# Patient Record
Sex: Male | Born: 1940
Health system: Southern US, Community
[De-identification: ages and names within clinical notes are randomized; demographics above are authoritative.]

## PROBLEM LIST (undated history)

## (undated) DIAGNOSIS — Z86010 Personal history of colonic polyps: Secondary | ICD-10-CM

## (undated) DIAGNOSIS — R609 Edema, unspecified: Secondary | ICD-10-CM

## (undated) DIAGNOSIS — R221 Localized swelling, mass and lump, neck: Secondary | ICD-10-CM

## (undated) DIAGNOSIS — M549 Dorsalgia, unspecified: Secondary | ICD-10-CM

## (undated) DIAGNOSIS — I6529 Occlusion and stenosis of unspecified carotid artery: Secondary | ICD-10-CM

## (undated) DIAGNOSIS — R251 Tremor, unspecified: Secondary | ICD-10-CM

## (undated) DIAGNOSIS — D72829 Elevated white blood cell count, unspecified: Secondary | ICD-10-CM

## (undated) DIAGNOSIS — Z0001 Encounter for general adult medical examination with abnormal findings: Secondary | ICD-10-CM

## (undated) DIAGNOSIS — R05 Cough: Secondary | ICD-10-CM

## (undated) DIAGNOSIS — E785 Hyperlipidemia, unspecified: Secondary | ICD-10-CM

## (undated) DIAGNOSIS — I10 Essential (primary) hypertension: Secondary | ICD-10-CM

## (undated) DIAGNOSIS — E039 Hypothyroidism, unspecified: Secondary | ICD-10-CM

## (undated) DIAGNOSIS — R220 Localized swelling, mass and lump, head: Secondary | ICD-10-CM

## (undated) HISTORY — DX: Edema, unspecified: R60.9

## (undated) HISTORY — DX: Localized swelling, mass and lump, neck: R22.1

## (undated) HISTORY — DX: Dorsalgia, unspecified: M54.9

## (undated) HISTORY — DX: Cough: R05

## (undated) HISTORY — DX: Occlusion and stenosis of unspecified carotid artery: I65.29

## (undated) HISTORY — DX: Hyperlipidemia, unspecified: E78.5

## (undated) HISTORY — DX: Encounter for general adult medical examination with abnormal findings: Z00.01

## (undated) HISTORY — DX: Personal history of colonic polyps: Z86.010

## (undated) HISTORY — DX: Essential (primary) hypertension: I10

## (undated) HISTORY — DX: Localized swelling, mass and lump, head: R22.0

## (undated) HISTORY — DX: Hypothyroidism, unspecified: E03.9

## (undated) HISTORY — DX: Tremor, unspecified: R25.1

## (undated) HISTORY — PX: APPENDECTOMY: SHX54

## (undated) HISTORY — PX: CATARACT EXTRACTION: SUR2

## (undated) HISTORY — DX: Elevated white blood cell count, unspecified: D72.829

## (undated) HISTORY — PX: CHOLECYSTECTOMY: SHX55

---

## 2001-08-24 ENCOUNTER — Encounter: Payer: Self-pay | Admitting: Internal Medicine

## 2001-08-24 ENCOUNTER — Ambulatory Visit (HOSPITAL_COMMUNITY): Admission: RE | Admit: 2001-08-24 | Discharge: 2001-08-24 | Payer: Self-pay | Admitting: Internal Medicine

## 2003-08-14 ENCOUNTER — Ambulatory Visit (HOSPITAL_BASED_OUTPATIENT_CLINIC_OR_DEPARTMENT_OTHER): Admission: RE | Admit: 2003-08-14 | Discharge: 2003-08-14 | Payer: Self-pay | Admitting: General Surgery

## 2003-08-14 ENCOUNTER — Ambulatory Visit (HOSPITAL_COMMUNITY): Admission: RE | Admit: 2003-08-14 | Discharge: 2003-08-14 | Payer: Self-pay | Admitting: General Surgery

## 2003-08-14 ENCOUNTER — Encounter (INDEPENDENT_AMBULATORY_CARE_PROVIDER_SITE_OTHER): Payer: Self-pay | Admitting: Specialist

## 2006-05-16 ENCOUNTER — Emergency Department (HOSPITAL_COMMUNITY): Admission: EM | Admit: 2006-05-16 | Discharge: 2006-05-16 | Payer: Self-pay | Admitting: Emergency Medicine

## 2006-05-19 ENCOUNTER — Ambulatory Visit: Payer: Self-pay | Admitting: Internal Medicine

## 2006-05-29 ENCOUNTER — Ambulatory Visit: Payer: Self-pay | Admitting: Gastroenterology

## 2006-06-12 ENCOUNTER — Ambulatory Visit: Payer: Self-pay | Admitting: Gastroenterology

## 2006-06-12 ENCOUNTER — Encounter: Payer: Self-pay | Admitting: Gastroenterology

## 2006-06-17 LAB — HM COLONOSCOPY: HM Colonoscopy: ABNORMAL

## 2008-01-12 ENCOUNTER — Ambulatory Visit: Payer: Self-pay | Admitting: Internal Medicine

## 2008-01-12 DIAGNOSIS — I10 Essential (primary) hypertension: Secondary | ICD-10-CM

## 2008-01-12 DIAGNOSIS — Z860100 Personal history of colon polyps, unspecified: Secondary | ICD-10-CM | POA: Insufficient documentation

## 2008-01-12 DIAGNOSIS — E785 Hyperlipidemia, unspecified: Secondary | ICD-10-CM

## 2008-01-12 DIAGNOSIS — M549 Dorsalgia, unspecified: Secondary | ICD-10-CM | POA: Insufficient documentation

## 2008-01-12 HISTORY — DX: Essential (primary) hypertension: I10

## 2008-01-12 HISTORY — DX: Hyperlipidemia, unspecified: E78.5

## 2008-01-12 HISTORY — DX: Personal history of colonic polyps: Z86.010

## 2008-01-12 HISTORY — DX: Personal history of colon polyps, unspecified: Z86.0100

## 2008-01-12 HISTORY — DX: Dorsalgia, unspecified: M54.9

## 2008-02-22 ENCOUNTER — Ambulatory Visit: Payer: Self-pay | Admitting: Internal Medicine

## 2008-02-23 LAB — CONVERTED CEMR LAB
ALT: 22 units/L (ref 0–53)
AST: 18 units/L (ref 0–37)
Albumin: 4.2 g/dL (ref 3.5–5.2)
Alkaline Phosphatase: 59 units/L (ref 39–117)
BUN: 17 mg/dL (ref 6–23)
Basophils Relative: 0 % (ref 0.0–3.0)
Bilirubin Urine: NEGATIVE
Bilirubin, Direct: 0.1 mg/dL (ref 0.0–0.3)
CO2: 31 meq/L (ref 19–32)
Calcium: 9.3 mg/dL (ref 8.4–10.5)
Chloride: 100 meq/L (ref 96–112)
Cholesterol: 185 mg/dL (ref 0–200)
Creatinine, Ser: 1 mg/dL (ref 0.4–1.5)
Eosinophils Relative: 1.5 % (ref 0.0–5.0)
GFR calc Af Amer: 96 mL/min
GFR calc non Af Amer: 79 mL/min
Glucose, Bld: 102 mg/dL — ABNORMAL HIGH (ref 70–99)
HCT: 49.6 % (ref 39.0–52.0)
HDL: 43 mg/dL (ref >39.0)
Hemoglobin, Urine: NEGATIVE
Hemoglobin: 17 g/dL (ref 13.0–17.0)
LDL Cholesterol: 121 mg/dL — ABNORMAL HIGH (ref 0–99)
Leukocytes, UA: NEGATIVE
Lymphocytes Relative: 27.6 % (ref 12.0–46.0)
MCHC: 34.3 g/dL (ref 30.0–36.0)
MCV: 97.1 fL (ref 78.0–100.0)
Monocytes Relative: 8.1 % (ref 3.0–12.0)
Neutrophils Relative %: 62.8 % (ref 43.0–77.0)
Nitrite: NEGATIVE
PSA: 0.55 ng/mL (ref 0.10–4.00)
Platelets: 210 10*3/uL (ref 150–400)
Potassium: 4.7 meq/L (ref 3.5–5.1)
RBC: 5.11 M/uL (ref 4.22–5.81)
RDW: 13.5 % (ref 11.5–14.6)
Sodium: 139 meq/L (ref 135–145)
Specific Gravity, Urine: 1.01 (ref 1.000–1.03)
TSH: 2.91 microintl units/mL (ref 0.35–5.50)
Total Bilirubin: 1.1 mg/dL (ref 0.3–1.2)
Total CHOL/HDL Ratio: 4.3
Total Protein, Urine: NEGATIVE mg/dL
Total Protein: 7.2 g/dL (ref 6.0–8.3)
Triglycerides: 103 mg/dL (ref 0–149)
Urine Glucose: NEGATIVE mg/dL
Urobilinogen, UA: 1 (ref 0.0–1.0)
VLDL: 21 mg/dL (ref 0–40)
Vit D, 1,25-Dihydroxy: 42 (ref 30–89)
WBC: 17.1 10*3/uL — ABNORMAL HIGH (ref 4.5–10.5)
pH: 7.5 (ref 5.0–8.0)

## 2008-02-28 ENCOUNTER — Ambulatory Visit: Payer: Self-pay | Admitting: Internal Medicine

## 2008-02-28 LAB — CONVERTED CEMR LAB
Basophils Absolute: 0 10*3/uL (ref 0.0–0.1)
Basophils Relative: 0.4 % (ref 0.0–3.0)
Eosinophils Absolute: 0.2 10*3/uL (ref 0.0–0.7)
Eosinophils Relative: 2.1 % (ref 0.0–5.0)
HCT: 47.2 % (ref 39.0–52.0)
Hemoglobin: 16.5 g/dL (ref 13.0–17.0)
Lymphocytes Relative: 27.7 % (ref 12.0–46.0)
MCHC: 35 g/dL (ref 30.0–36.0)
MCV: 96.8 fL (ref 78.0–100.0)
Monocytes Absolute: 1.5 10*3/uL — ABNORMAL HIGH (ref 0.1–1.0)
Monocytes Relative: 13.3 % — ABNORMAL HIGH (ref 3.0–12.0)
Neutro Abs: 6.6 10*3/uL (ref 1.4–7.7)
Neutrophils Relative %: 56.5 % (ref 43.0–77.0)
Platelets: 263 10*3/uL (ref 150–400)
RBC: 4.87 M/uL (ref 4.22–5.81)
RDW: 14 % (ref 11.5–14.6)
WBC: 11.5 10*3/uL — ABNORMAL HIGH (ref 4.5–10.5)

## 2008-03-02 ENCOUNTER — Ambulatory Visit: Payer: Self-pay | Admitting: Internal Medicine

## 2008-03-02 DIAGNOSIS — D72829 Elevated white blood cell count, unspecified: Secondary | ICD-10-CM | POA: Insufficient documentation

## 2008-03-02 HISTORY — DX: Elevated white blood cell count, unspecified: D72.829

## 2010-05-28 ENCOUNTER — Ambulatory Visit: Payer: Self-pay | Admitting: Internal Medicine

## 2010-05-28 DIAGNOSIS — R220 Localized swelling, mass and lump, head: Secondary | ICD-10-CM | POA: Insufficient documentation

## 2010-05-28 DIAGNOSIS — R059 Cough, unspecified: Secondary | ICD-10-CM | POA: Insufficient documentation

## 2010-05-28 DIAGNOSIS — R221 Localized swelling, mass and lump, neck: Secondary | ICD-10-CM

## 2010-05-28 DIAGNOSIS — R05 Cough: Secondary | ICD-10-CM

## 2010-05-28 HISTORY — DX: Cough, unspecified: R05.9

## 2010-05-28 HISTORY — DX: Localized swelling, mass and lump, head: R22.0

## 2010-05-28 LAB — CONVERTED CEMR LAB
ALT: 19 units/L (ref 0–53)
AST: 19 units/L (ref 0–37)
Albumin: 4 g/dL (ref 3.5–5.2)
Alkaline Phosphatase: 61 units/L (ref 39–117)
BUN: 19 mg/dL (ref 6–23)
Basophils Absolute: 0.1 10*3/uL (ref 0.0–0.1)
Basophils Relative: 1.1 % (ref 0.0–3.0)
Bilirubin Urine: NEGATIVE
Bilirubin, Direct: 0.1 mg/dL (ref 0.0–0.3)
CO2: 32 meq/L (ref 19–32)
Calcium: 9.4 mg/dL (ref 8.4–10.5)
Chloride: 105 meq/L (ref 96–112)
Cholesterol: 196 mg/dL (ref 0–200)
Creatinine, Ser: 1.2 mg/dL (ref 0.4–1.5)
Direct LDL: 116.7 mg/dL
Eosinophils Absolute: 0.3 10*3/uL (ref 0.0–0.7)
Eosinophils Relative: 2.4 % (ref 0.0–5.0)
GFR calc non Af Amer: 66.3 mL/min (ref >60)
Glucose, Bld: 94 mg/dL (ref 70–99)
HCT: 47 % (ref 39.0–52.0)
HDL: 51 mg/dL (ref >39.00)
Hemoglobin: 16.3 g/dL (ref 13.0–17.0)
Ketones, ur: NEGATIVE mg/dL
Leukocytes, UA: NEGATIVE
Lymphocytes Relative: 23.7 % (ref 12.0–46.0)
Lymphs Abs: 3.1 10*3/uL (ref 0.7–4.0)
MCHC: 34.6 g/dL (ref 30.0–36.0)
MCV: 100.1 fL — ABNORMAL HIGH (ref 78.0–100.0)
Monocytes Absolute: 1.7 10*3/uL — ABNORMAL HIGH (ref 0.1–1.0)
Monocytes Relative: 13.1 % — ABNORMAL HIGH (ref 3.0–12.0)
Neutro Abs: 7.7 10*3/uL (ref 1.4–7.7)
Neutrophils Relative %: 59.7 % (ref 43.0–77.0)
Nitrite: NEGATIVE
PSA: 0.52 ng/mL (ref 0.10–4.00)
Platelets: 263 10*3/uL (ref 150.0–400.0)
Potassium: 4.2 meq/L (ref 3.5–5.1)
RBC: 4.7 M/uL (ref 4.22–5.81)
RDW: 14.4 % (ref 11.5–14.6)
Sodium: 141 meq/L (ref 135–145)
Specific Gravity, Urine: >=1.03 (ref 1.000–1.030)
TSH: 4.59 microintl units/mL (ref 0.35–5.50)
Total Bilirubin: 0.6 mg/dL (ref 0.3–1.2)
Total CHOL/HDL Ratio: 4
Total Protein, Urine: NEGATIVE mg/dL
Total Protein: 6.9 g/dL (ref 6.0–8.3)
Triglycerides: 230 mg/dL — ABNORMAL HIGH (ref 0.0–149.0)
Urine Glucose: NEGATIVE mg/dL
Urobilinogen, UA: 0.2 (ref 0.0–1.0)
VLDL: 46 mg/dL — ABNORMAL HIGH (ref 0.0–40.0)
WBC: 12.9 10*3/uL — ABNORMAL HIGH (ref 4.5–10.5)
pH: 5.5 (ref 5.0–8.0)

## 2010-06-06 ENCOUNTER — Encounter: Admission: RE | Admit: 2010-06-06 | Discharge: 2010-06-06 | Payer: Self-pay | Admitting: Otolaryngology

## 2010-06-25 ENCOUNTER — Encounter: Payer: Self-pay | Admitting: Internal Medicine

## 2010-06-28 ENCOUNTER — Ambulatory Visit: Payer: Self-pay | Admitting: Internal Medicine

## 2010-06-28 DIAGNOSIS — R609 Edema, unspecified: Secondary | ICD-10-CM | POA: Insufficient documentation

## 2010-06-28 HISTORY — DX: Edema, unspecified: R60.9

## 2010-08-29 NOTE — Assessment & Plan Note (Signed)
Summary: 1 MO ROV /NWS  #   Vital Signs:  Patient profile:   70 year old male Height:      68 inches Weight:      234 pounds BMI:     35.71 O2 Sat:      95 % on Room air Temp:     98 degrees F oral Pulse rate:   80 / minute BP sitting:   142 / 72  (left arm) Cuff size:   large  Vitals Entered By: Zella Ball Ewing CMA Duncan Dull) (June 28, 2010 2:14 PM)  O2 Flow:  Room air  Preventive Care Screening     declines flu shot/pneumovax  CC: 1 month ROV/RE   CC:  1 month ROV/RE.  History of Present Illness: here to f/u , overall doing ok,  some increased LE edema recent, stands quite a bit, gained wt, no longer walking as much a mile per day as wife has less time (wife is IT trainer still working);  Pt denies CP, worsening sob, doe, wheezing, orthopnea, pnd, palps, dizziness or syncope. Pt denies new neuro symptoms such as headache, facial or extremity weakness  Pt denies polydipsia, polyuria, or low sugar symptoms such as shakiness im.  Overall good compliance with meds, trying to follow low chol  diet, wt stable, little excercise however   Saw ENT with CT scan  nov 10 with prob benign parotid gland mass, but has f/u with ENT soon  Problems Prior to Update: 1)  Peripheral Edema  (ICD-782.3) 2)  Cough  (ICD-786.2) 3)  Swelling Mass or Lump in Head and Neck  (ICD-784.2) 4)  Leukocytosis  (ICD-288.60) 5)  Preventive Health Care  (ICD-V70.0) 6)  Colonic Polyps, Hx of  (ICD-V12.72) 7)  Hypertension  (ICD-401.9) 8)  Hyperlipidemia  (ICD-272.4) 9)  Back Pain  (ICD-724.5)  Medications Prior to Update: 1)  Adult Aspirin Ec Low Strength 81 Mg  Tbec (Aspirin) .Marland Kitchen.. 1 By Mouth Once Daily 2)  Glucosamine 500 Mg Caps (Glucosamine Sulfate) .Marland Kitchen.. 1 By Mouth Once Daily 3)  Multivitamins  Tabs (Multiple Vitamin) .Marland Kitchen.. 1 By Mouth Once Daily 4)  Losartan Potassium 100 Mg Tabs (Losartan Potassium) .Marland Kitchen.. 1po Once Daily  Current Medications (verified): 1)  Adult Aspirin Ec Low Strength 81 Mg  Tbec (Aspirin) .Marland Kitchen..  1 By Mouth Once Daily 2)  Glucosamine 500 Mg Caps (Glucosamine Sulfate) .Marland Kitchen.. 1 By Mouth Once Daily 3)  Multivitamins  Tabs (Multiple Vitamin) .Marland Kitchen.. 1 By Mouth Once Daily 4)  Losartan Potassium-Hctz 100-12.5 Mg Tabs (Losartan Potassium-Hctz) .Marland Kitchen.. 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   Head:  Normocephalic and atraumatic without obvious abnormalities. No apparent alopecia or balding. Eyes:  No corneal or conjunctival inflammation noted. EOMI. Perrla. Ears:  R ear normal and L ear normal.   Nose:  External nasal examination shows no deformity or inflammation. Nasal mucosa are pink and moist without lesions or exudates. Mouth:  Oral mucosa and  oropharynx without lesions or exudates.  Teeth in good repair s/p double root canals Neck:  No deformities, masses, or tenderness noted. except for approx 1 cm mass firm (not soft or hard) relatively fixed to site just post to angle left jaw Lungs:  Normal respiratory effort, chest expands symmetrically. Lungs are clear to auscultation, no crackles or wheezes. Heart:  Normal rate and regular rhythm. S1 and S2 normal without gallop, murmur, click, rub or other extra sounds. Extremities:  trace -1+ bilat LE edema, no ulcers    Impression & Recommendations:  Problem # 1:  PERIPHERAL EDEMA (ICD-782.3)  His updated medication  list for this problem includes:    Losartan Potassium-hctz 100-12.5 Mg Tabs (Losartan potassium-hctz) .Marland Kitchen... 1po once daily treat as above, f/u any worsening signs or symptoms - added the HCT to the losartan  Problem # 2:  HYPERTENSION (ICD-401.9)  His updated medication list for this problem includes:    Losartan Potassium-hctz 100-12.5 Mg Tabs (Losartan potassium-hctz) .Marland Kitchen... 1po once daily  BP today: 142/72 Prior BP: 150/88 (05/28/2010)  Labs Reviewed: K+: 4.2 (05/28/2010) Creat: : 1.2 (05/28/2010)   Chol: 196 (05/28/2010)   HDL: 51.00 (05/28/2010)   LDL: 121 (02/22/2008)   TG: 230.0 (05/28/2010) treat as above, f/u any worsening signs or symptoms , uncontrolled overall, f/u BP at home and next visit  Problem # 3:  HYPERLIPIDEMIA (ICD-272.4)  Labs Reviewed: SGOT: 19 (05/28/2010)   SGPT: 19 (05/28/2010)   HDL:51.00 (05/28/2010), 43.0 (02/22/2008)  LDL:121 (02/22/2008)  Chol:196 (05/28/2010), 185 (02/22/2008)  Trig:230.0 (05/28/2010), 103 (02/22/2008) d.w pt , declines further tx, Pt to continue diet efforts, to check labs - goal LDL less than 100  Complete Medication List: 1)  Adult Aspirin Ec Low Strength 81 Mg Tbec (Aspirin) .Marland Kitchen.. 1 by mouth once daily 2)  Glucosamine 500 Mg Caps (Glucosamine sulfate) .Marland Kitchen.. 1 by mouth once daily 3)  Multivitamins Tabs (Multiple vitamin) .Marland Kitchen.. 1 by mouth once daily 4)  Losartan Potassium-hctz 100-12.5 Mg Tabs (Losartan potassium-hctz) .Marland Kitchen.. 1po once daily  Patient Instructions: 1)  stop the losartan when you are done with the current bottle 2)  start the losartan-HCT - 1 per day after that 3)  Continue all previous medications as before this visit  4)  Please schedule a follow-up appointment in 6 months. Prescriptions: LOSARTAN POTASSIUM-HCTZ 100-12.5 MG TABS (LOSARTAN POTASSIUM-HCTZ) 1po once daily  #90 x 3   Entered and Authorized by:   Corwin Levins MD   Signed by:   Corwin Levins MD on 06/28/2010   Method used:   Print then Give to Patient    RxID:   [id number]    Orders Added: 1)  Est. Patient Level IV [69629]

## 2010-08-29 NOTE — Assessment & Plan Note (Signed)
Summary: 6 WK ROV /NWS $50   Vital Signs:  Patient Profile:   70 Years Old Male Weight:      216 pounds Temp:     98.1 degrees F oral Pulse rate:   65 / minute BP sitting:   158 / 91  (right arm) Cuff size:   regular  Vitals Entered By: Jerilynn Mages (February 22, 2008 10:12 AM)                  Chief Complaint:  6 week ROV.  History of Present Illness: BP at drug store usually in the mid 140's/85, lost 7 lbs intentionally; back pain resolved    Updated Prior Medication List: ADULT ASPIRIN EC LOW STRENGTH 81 MG  TBEC (ASPIRIN) 1 by mouth once daily  Current Allergies (reviewed today): No known allergies   Past Medical History:    Reviewed history from 01/12/2008 and no changes required:       Hyperlipidemia       Hypertension       Colonic polyps, hx of  Past Surgical History:    Reviewed history from 01/12/2008 and no changes required:       Appendectomy       Cholecystectomy       Cataract extraction   Family History:    Reviewed history from 01/12/2008 and no changes required:       heart disease       HTN       esophageal cancer       grandfather with TB  Social History:    Reviewed history from 01/12/2008 and no changes required:       work - Retail banker       former college professor - drama       former Nurse, 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 [employer]       Married       Current Smoker       Alcohol use-no       no children    Review of Systems  The patient denies anorexia, fever, weight loss, weight gain, vision loss, decreased hearing, hoarseness, chest pain, syncope, dyspnea on exertion, peripheral edema, prolonged cough, headaches, hemoptysis, abdominal pain, melena, hematochezia, severe indigestion/heartburn, hematuria, incontinence, genital sores, muscle weakness, suspicious skin lesions, transient blindness, difficulty walking, depression, unusual weight change, abnormal bleeding, enlarged lymph nodes, angioedema, breast masses, and testicular  masses.         has been depressed in the past due to poor business, gained wt but now trying to do beter; now trying to walk with wife daily;    Physical Exam  General:     alert and overweight-appearing.   Head:     Normocephalic and atraumatic without obvious abnormalities. No apparent alopecia or balding. Eyes:     No corneal or conjunctival inflammation noted. EOMI. Perrla. Ears:     External ear exam shows no significant lesions or deformities.  Otoscopic examination reveals clear canals, tympanic membranes are intact bilaterally without bulging, retraction, inflammation or discharge. Hearing is grossly normal bilaterally. Nose:     External nasal examination shows no deformity or inflammation. Nasal mucosa are pink and moist without lesions or exudates. Mouth:     Oral mucosa and oropharynx without lesions or exudates.  Teeth in good repair. Neck:     No deformities, masses, or tenderness noted. Lungs:     Normal respiratory effort, chest expands symmetrically. Lungs are clear to auscultation, no crackles or wheezes.  Heart:     Normal rate and regular rhythm. S1 and S2 normal without gallop, murmur, click, rub or other extra sounds. Abdomen:     Bowel sounds positive,abdomen soft and non-tender without masses, organomegaly or hernias noted. Msk:     No deformity or scoliosis noted of thoracic or lumbar spine.   Extremities:     No clubbing, cyanosis, edema, or deformity noted with normal full range of motion of all joints.   Neurologic:     alert & oriented X3, cranial nerves II-XII intact, and strength normal in all extremities.      Impression & Recommendations:  Problem # 1:  Preventive Health Care (ICD-V70.0) Overall doing well, up to date, counseled on routine health concerns for screening and prevention, immunizations up to date or declined, labs ordered   Orders: TLB-BMP (Basic Metabolic Panel-BMET) (80048-METABOL) TLB-CBC Platelet - w/Differential  (85025-CBCD) TLB-Hepatic/Liver Function Pnl (80076-HEPATIC) TLB-Lipid Panel (80061-LIPID) TLB-PSA (Prostate Specific Antigen) (84153-PSA) TLB-TSH (Thyroid Stimulating Hormone) (84443-TSH) TLB-Udip ONLY (81003-UDIP) T-Vitamin D (25-Hydroxy) (04540-98119)   Problem # 2:  HYPERTENSION (ICD-401.9) decines meds as this time, is trying to lose wt  Complete Medication List: 1)  Adult Aspirin Ec Low Strength 81 Mg Tbec (Aspirin) .Marland Kitchen.. 1 by mouth once daily   Patient Instructions: 1)  Please go to the Lab in the basement for your blood tests today 2)  Continue all medications that you may have been taking previously 3)  Please schedule a follow-up appointment in 1 year or sooner if needed 4)  Check your Blood Pressure regularly. If it is above 140/90: you should make an appointment sooner   ]

## 2010-08-29 NOTE — Assessment & Plan Note (Signed)
Summary: FU-R LEG PAIN-$50-STC   Vital Signs:  Patient Profile:   70 Years Old Male Weight:      223 pounds Temp:     97.2 degrees F oral Pulse rate:   64 / minute Pulse rhythm:   regular BP sitting:   160 / 90  (left arm) Cuff size:   large  Vitals Entered By: Rock Nephew CMA (January 12, 2008 2:19 PM)                 Preventive Care Screening  Colonoscopy:    Date:  06/17/2006    Next Due:  06/2011    Results:  abnormal   Last Tetanus Booster:    Date:  07/28/2001    Results:  given    History of Present Illness: here wth recurrent LBP ever 3 to 4 yrs since colletge days; usually last only 1 to 2 days; this time seemed to start the right Lower back to the right shin area Only with lying down at night; no problems with standing or walking; waklks many mornings for one mile for excercise and has kept on doing that; no weakness or falls - only when starts hurting at night and has to get up get the pain to improve  by standing; he hopes his BP today is due to stress and has not checked lately at home or o/w    Updated Prior Medication List: VICODIN 5-500 MG  TABS (HYDROCODONE-ACETAMINOPHEN) 1 by mouth at bedtime as needed pain PREDNISONE 20 MG  TABS (PREDNISONE) 1 by mouth once daily  Current Allergies (reviewed today): No known allergies   Past Medical History:    Reviewed history and no changes required:       Hyperlipidemia       Hypertension       Colonic polyps, hx of  Past Surgical History:    Reviewed history and no changes required:       Appendectomy       Cholecystectomy       Cataract extraction   Family History:    Reviewed history and no changes required:       heart disease       HTN       esophageal cancer       grandfather with TB  Social History:    Reviewed history and no changes required:       work - Retail banker       former college professor - drama       former Nurse, 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 No apparent alopecia or balding. Eyes:     No corneal or conjunctival inflammation noted. EOMI. Perrla.  Ears:     External ear exam shows no significant lesions or deformities.  Otoscopic examination reveals clear canals, tympanic membranes are intact bilaterally without bulging, retraction, inflammation or discharge. Hearing is grossly normal  bilaterally. Nose:     External nasal examination shows no deformity or inflammation. Nasal mucosa are pink and moist without lesions or exudates. Mouth:     Oral mucosa and oropharynx without lesions or exudates.  Teeth in good repair. Neck:     No deformities, masses, or tenderness noted. Lungs:     Normal respiratory effort, chest expands symmetrically. Lungs are clear to auscultation, no crackles or wheezes. Heart:     Normal rate and regular rhythm. S1 and S2 normal without gallop, murmur, click, rub or other extra sounds. Msk:     nontender lumbar paravetebral Extremities:     no edema, no ulcers  Neurologic:     alert & oriented X3, cranial nerves II-XII intact, and strength normal in all extremities.      Impression & Recommendations:  Problem # 1:  BACK PAIN (ICD-724.5) c/w right sciatica - tx with vicodin at bedtime as needed, check ls spine films, low dose prednisone His updated medication list for this problem includes:    Vicodin 5-500 Mg Tabs (Hydrocodone-acetaminophen) .Marland Kitchen... 1 by mouth at bedtime as needed pain  Orders: T-Lumbar Spine Complete, 5 Views  (71110TC)   Problem # 2:  ELEVATED BLOOD PRESSURE WITHOUT DIAGNOSIS OF HYPERTENSION (ICD-796.2) to check BP at home frequently  Problem # 3:  HYPERLIPIDEMIA (ICD-272.4) d/w pt most recent lipids, goal LDL less than 100  Complete Medication List: 1)  Vicodin 5-500 Mg Tabs (Hydrocodone-acetaminophen) .Marland Kitchen.. 1 by mouth at bedtime as needed pain 2)  Prednisone 20 Mg Tabs (Prednisone) .Marland Kitchen.. 1 by mouth once daily   Patient Instructions: 1)  take all new medications as prescribed 2)  continue all medications that you may have been taking previously 3)  you will have back xrays today 4)  Please schedule a follow-up appointment in 6 weeks with CPX labs 5)  Check your Blood Pressure regularly. If it is above: 140/90 you should make an appointment.   Prescriptions: PREDNISONE 20 MG  TABS (PREDNISONE) 1 by mouth once daily  #7 x 0   Entered and Authorized by:   Corwin Levins MD   Signed by:   Corwin Levins MD on 01/12/2008   Method used:   Print then Give to Patient   RxID:   [id number] VICODIN 5-500 MG  TABS (HYDROCODONE-ACETAMINOPHEN) 1 by mouth at bedtime as needed pain  #30 x 2   Entered and Authorized by:   Corwin Levins MD   Signed by:   Corwin Levins MD on 01/12/2008   Method used:   Print then Give to Patient   RxID:   [id number]  ]

## 2010-08-29 NOTE — Assessment & Plan Note (Signed)
Summary: LUMP BEHIND EAR/NWS   Vital Signs:  Patient profile:   70 year old male Height:      67.5 inches Weight:      232.13 pounds BMI:     35.95 O2 Sat:      94 % on Room air Temp:     98.9 degrees F oral Pulse rate:   71 / minute BP sitting:   150 / 88  (left arm) Cuff size:   large  Vitals Entered By: Zella Ball Ewing CMA Duncan Dull) (May 28, 2010 1:43 PM)  O2 Flow:  Room air  Preventive Care Screening     declines pneumovax  CC: Lump behind left ear/RE   CC:  Lump behind left ear/RE.  History of Present Illness: here for wellness and f/u after lost to f/u since aug 2009;  last summer had tongue callous removed  (no cancer), and 3 wks ago had double root canal doing better now;  smoking now down to half pack per day but not quite ready to quit; has hearing aids he doesnt wear so has approx moderate hearing loss bilat;   today has a knot he noticed 3 days ago to the left neck behind the angle of the jaw, non tender and no other pain;  No fever, wt loss, night sweats, loss of appetite or other constitutional symptoms Pt denies CP, worsening sob, doe, wheezing, orthopnea, pnd, worsening LE edema, palps, dizziness or syncope  Pt denies new neuro symptoms such as headache, facial or extremity weakness Pt states good ability with ADL's, low fall risk, home safety reviewed and adequate, no significant change in hearing or vision, trying to follow lower chol diet, and occasionally active only with regular excercise.   Preventive Screening-Counseling & Management      Drug Use:  no.    Problems Prior to Update: 1)  Cough  (ICD-786.2) 2)  Swelling Mass or Lump in Head and Neck  (ICD-784.2) 3)  Leukocytosis  (ICD-288.60) 4)  Preventive Health Care  (ICD-V70.0) 5)  Colonic Polyps, Hx of  (ICD-V12.72) 6)  Hypertension  (ICD-401.9) 7)  Hyperlipidemia  (ICD-272.4) 8)  Back Pain  (ICD-724.5)  Medications Prior to Update: 1)  Adult Aspirin Ec Low Strength 81 Mg  Tbec (Aspirin) .Marland Kitchen.. 1 By  Mouth Once Daily  Current Medications (verified): 1)  Adult Aspirin Ec Low Strength 81 Mg  Tbec (Aspirin) .Marland Kitchen.. 1 By Mouth Once Daily 2)  Glucosamine 500 Mg Caps (Glucosamine Sulfate) .Marland Kitchen.. 1 By Mouth Once Daily 3)  Multivitamins  Tabs (Multiple Vitamin) .Marland Kitchen.. 1 By Mouth Once Daily 4)  Losartan Potassium 100 Mg Tabs (Losartan Potassium) .Marland Kitchen.. 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 [employer] Married Current Smoker Alcohol use-no no children Drug use-no  Risk Factors: Smoking Status: current (01/12/2008)  Social History: 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         all otherwise negative per pt -  except for chronic ongoing occasional cough non prod, without overt reflux or allergic symptoms or wheezing  Physical Exam  General:  alert and overweight-appearing.   Head:  Normocephalic and atraumatic without obvious  abnormalities. No apparent alopecia or balding. Eyes:  No corneal or conjunctival inflammation noted. EOMI. Perrla. Ears:  R ear normal and L ear normal.   Nose:  External nasal examination shows no deformity or inflammation. Nasal mucosa are pink and moist without lesions or exudates. Mouth:  Oral mucosa and oropharynx without lesions or exudates.  Teeth in good repair s/p double root canals Neck:  No deformities, masses, or tenderness noted. except for approx 1 cm mass firm (not soft or hard) relatively fixed to site just post to angle left jaw Lungs:  Normal respiratory effort, chest expands symmetrically. Lungs are clear to auscultation, no crackles or wheezes. Heart:  Normal rate and regular rhythm. S1 and S2 normal without gallop, murmur, click, rub or other extra sounds. Abdomen:  Bowel sounds positive,abdomen soft and non-tender without masses, organomegaly or hernias noted. Msk:  No deformity or scoliosis noted of thoracic or lumbar spine.   Extremities:  no edema, no ulcers  Neurologic:  alert & oriented X3, cranial nerves II-XII intact, and strength normal in all extremities.   Skin:  no rashes.   Psych:  not depressed appearing.     Impression & Recommendations:  Problem # 1:  Preventive Health Care (ICD-V70.0) Overall doing well, age appropriate education and counseling updated, referral for preventive services and immunizations addressed, dietary counseling and smoking status adressed , most recent labs reviewed  I have personally reviewed and have noted 1.The patient's medical and social history 2.Their use of alcohol, tobacco or illicit drugs 3.Their current medications and supplements 4. Functional ability including ADL's, fall risk, home safety risk, hearing & visual impairment  5.Diet and physical activities 6.Evidence for depression or mood disorders The patients weight, height, BMI  have been recorded in the chart I have made referrals, counseling and provided  education to the patient based review of the above  Orders: TLB-BMP (Basic Metabolic Panel-BMET) (80048-METABOL) TLB-CBC Platelet - w/Differential (85025-CBCD) TLB-Hepatic/Liver Function Pnl (80076-HEPATIC) TLB-Lipid Panel (80061-LIPID) TLB-PSA (Prostate Specific Antigen) (84153-PSA) TLB-TSH (Thyroid Stimulating Hormone) (84443-TSH) TLB-Udip ONLY (81003-UDIP)  Problem # 2:  SWELLING MASS OR LUMP IN HEAD AND NECK (ICD-784.2)  single mass at and behind the left angle of jaw, no surrounding LA or other mass, oral exam ok;  did have recent oral procedure but lump nontender and firm, somewhat fixed , ? slightly > 1 cm -   somewhat suspicious for malignancy,  d/w pt - refer ENT, urged to quit smoking  Orders: ENT Referral (ENT)  Problem # 3:  COUGH (ICD-786.2)  ? smokers cough vs other - for cxr  Orders: T-2 View CXR, Same Day (71020.5TC)  Problem # 4:  HYPERTENSION (ICD-401.9)  BP today: 150/88 Prior BP: 150/84 (03/02/2008)  Labs Reviewed: K+: 4.7 (02/22/2008) Creat: : 1.0 (02/22/2008)   Chol: 185 (02/22/2008)   HDL: 43.0 (02/22/2008)   LDL: 121 (02/22/2008)   TG: 103 (02/22/2008) start losartan 100 mg per day  His updated medication list for this problem includes:    Losartan Potassium 100 Mg Tabs (Losartan potassium) .Marland KitchenMarland KitchenMarland KitchenMarland Kitchen  1po once daily  Complete Medication List: 1)  Adult Aspirin Ec Low Strength 81 Mg Tbec (Aspirin) .Marland Kitchen.. 1 by mouth once daily 2)  Glucosamine 500 Mg Caps (Glucosamine sulfate) .Marland Kitchen.. 1 by mouth once daily 3)  Multivitamins Tabs (Multiple vitamin) .Marland Kitchen.. 1 by mouth once daily 4)  Losartan Potassium 100 Mg Tabs (Losartan potassium) .Marland Kitchen.. 1po once daily  Patient Instructions: 1)  Please go to the Lab in the basement for your blood and/or urine tests today 2)  Please go to Radiology in the basement level for your X-Ray today  3)  Please call the number on the [facility] Card for results of your testing 4)  You will be contacted about the referral(s) to: ENT 5)  Please  take all new medications as prescribed  - the losartan 100 mg per blood pressure (one per day, no side effects) 6)  Continue all previous medications as before this visit 7)  Please schedule a follow-up appointment in 1 month. Prescriptions: LOSARTAN POTASSIUM 100 MG TABS (LOSARTAN POTASSIUM) 1po once daily  #90 x 3   Entered and Authorized by:   Corwin Levins MD   Signed by:   Corwin Levins MD on 05/28/2010   Method used:   Print then Give to Patient   RxID:   [id number]    Orders Added: 1)  ENT Referral [ENT] 2)  T-2 View CXR, Same Day [71020.5TC] 3)  TLB-BMP (Basic Metabolic Panel-BMET) [80048-METABOL] 4)  TLB-CBC Platelet - w/Differential [85025-CBCD] 5)  TLB-Hepatic/Liver Function Pnl [80076-HEPATIC] 6)  TLB-Lipid Panel [80061-LIPID] 7)  TLB-PSA (Prostate Specific Antigen) [84153-PSA] 8)  TLB-TSH (Thyroid Stimulating Hormone) [84443-TSH] 9)  TLB-Udip ONLY [81003-UDIP] 10)  Est. Patient 65& > [84696]

## 2010-08-29 NOTE — Consult Note (Signed)
Summary: Telephone Conversation / Narda Bonds MD  Telephone Conversation / Narda Bonds MD   Imported By: Lennie Odor 07/02/2010 14:53:48  _____________________________________________________________________  External Attachment:    Type:   Image     Comment:   External Document

## 2010-08-29 NOTE — Assessment & Plan Note (Signed)
Summary: FU Vincent Campbell $50   Vital Signs:  Patient Profile:   70 Years Old Male Weight:      216 pounds Temp:     98.2 degrees F oral Pulse rate:   66 / minute BP sitting:   150 / 84  (right arm) Cuff size:   large  Vitals Entered By: Jerilynn Mages (March 02, 2008 8:18 AM)                 Chief Complaint:  follow-up.  History of Present Illness: overall  doing well, complaint with meds, no fever or other s/s infection    Updated Prior Medication List: ADULT ASPIRIN EC LOW STRENGTH 81 MG  TBEC (ASPIRIN) 1 by mouth once daily  Current Allergies (reviewed today): No known allergies   Past Medical History:    Reviewed history from 01/12/2008 and no changes required:       Hyperlipidemia       Hypertension       Colonic polyps, hx of  Past Surgical History:    Reviewed history from 01/12/2008 and no changes required:       Appendectomy       Cholecystectomy       Cataract extraction   Social History:    Reviewed history from 01/12/2008 and no changes required:       work - Retail banker       former college professor - drama       former Nurse, 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   Head:     Normocephalic and atraumatic without obvious abnormalities. No apparent alopecia or balding. Eyes:     No corneal or conjunctival inflammation noted. EOMI. Perrla. Ears:     External ear exam shows no significant lesions or deformities.  Otoscopic examination reveals clear canals, tympanic membranes are intact bilaterally without bulging, retraction, inflammation or discharge. Hearing is grossly normal bilaterally. Nose:     External nasal examination shows no deformity or inflammation. Nasal mucosa are pink and moist without lesions or exudates. Mouth:     Oral mucosa and  oropharynx without lesions or exudates.  Teeth in good repair. Neck:     No deformities, masses, or tenderness noted. Lungs:     Normal respiratory effort, chest expands symmetrically. Lungs are clear to auscultation, no crackles or wheezes. Heart:     Normal rate and regular rhythm. S1 and S2 normal without gallop, murmur, click, rub or other extra sounds. Extremities:     no edema, no ulcers     Impression & Recommendations:  Problem # 1:  LEUKOCYTOSIS (ICD-288.60) improved on f/u - ? lab error to start - exam benign, ok to follow for now, to consdier repeat cbc next visit  Problem # 2:  HYPERTENSION (ICD-401.9)  BP today: 150/84 Prior BP: 158/91 (02/22/2008)  Labs Reviewed: Creat: 1.0 (02/22/2008) Chol: 185 (02/22/2008)   HDL: 43.0 (02/22/2008)   LDL: 121 (02/22/2008)   TG: 103 (02/22/2008) d/w pt - has marked denial of condition - refused meds today  Problem # 3:  HYPERLIPIDEMIA (ICD-272.4) d/w pt - marked denial of problem - declines statin  Complete Medication List: 1)  Adult Aspirin Ec Low Strength 81 Mg Tbec (Aspirin) .Marland Kitchen.. 1 by mouth once daily   Patient Instructions: 1)  Continue all medications that you may have been taking previously 2)  Please schedule a follow-up appointment in 6 months with prior: 3)  Lipid Panel prior to visit, ICD-9: 272.0 4)  CBC; 288.60 5)  Check your Blood Pressure regularly.Your goal is to be less than 140/90   ]

## 2010-12-13 NOTE — Op Note (Signed)
NAME:  Vincent Campbell, Vincent Campbell NO.:  [id number]   MEDICAL RECORD NO.:  [id number]                   PATIENT TYPE:  AMB   LOCATION:  [facility]                                  FACILITY:  [facility]   PHYSICIAN:  Jimmye Norman III, M.D.               DATE OF BIRTH:  27-Jan-1941   DATE OF PROCEDURE:  08/14/2003  DATE OF DISCHARGE:                                 OPERATIVE REPORT   PREOPERATIVE DIAGNOSES:  Sebaceous cyst, healed, of the mid back.   POSTOPERATIVE DIAGNOSES:  Sebaceous cyst, healed, of the mid back.   OPERATION PERFORMED:  Excision of sebaceous cyst.   SURGEON:  Marta Lamas. Lindie Spruce, M.D.   ASSISTANT:  None.   ANESTHESIA:  General endotracheal.   ESTIMATED BLOOD LOSS:  Less than 20 mL.   COMPLICATIONS:  None.   CONDITION:  Stable.   INDICATIONS FOR PROCEDURE:  The patient is a 70 year old who has had the  second of two sebaceous cysts, one previously removed, this recently healed  from an infection, who now comes in for excision.   DESCRIPTION OF PROCEDURE:  The patient was taken to the operating room and  placed on the table in the supine position.  After an adequate general  anesthetic was administered, the patient was flipped into the prone position  with his midback exposed and he was prepped and draped in the usual sterile  manner.   A #15 blade was used to make an oval incision around the midback lesion  measuring approximately 3.5 to 4 cm in total length.  WE excised it in order  to include the punctum of the sebaceous cyst which is in the upper portion  of the area of the previously scarred area.  We excised it down to the deep  subcutaneous tissue just above the fascia, obtained hemostasis with  electrocautery, then closed it using interrupted 3-0 nylon sutures.  No deep  sutures were placed.  We irrigated with saline prior to closure and also  used 8 mL of 0.25% Marcaine with epinephrine at the site.  All sponge,  needle and instrument counts  were correct.                                               Kathrin Ruddy, M.D.    JW/MEDQ  D:  08/14/2003  T:  08/14/2003  Job:  [id number]

## 2010-12-29 ENCOUNTER — Encounter: Payer: Self-pay | Admitting: Internal Medicine

## 2010-12-29 DIAGNOSIS — Z0001 Encounter for general adult medical examination with abnormal findings: Secondary | ICD-10-CM

## 2010-12-29 HISTORY — DX: Encounter for general adult medical examination with abnormal findings: Z00.01

## 2011-01-01 ENCOUNTER — Ambulatory Visit (INDEPENDENT_AMBULATORY_CARE_PROVIDER_SITE_OTHER): Payer: 59 | Admitting: Internal Medicine

## 2011-01-01 ENCOUNTER — Other Ambulatory Visit (INDEPENDENT_AMBULATORY_CARE_PROVIDER_SITE_OTHER): Payer: 59

## 2011-01-01 ENCOUNTER — Encounter: Payer: Self-pay | Admitting: Internal Medicine

## 2011-01-01 VITALS — BP 118/70 | HR 76 | Temp 98.4°F | Ht 67.0 in | Wt 232.2 lb

## 2011-01-01 DIAGNOSIS — Z0000 Encounter for general adult medical examination without abnormal findings: Secondary | ICD-10-CM

## 2011-01-01 LAB — LIPID PANEL
Cholesterol: 200 mg/dL (ref 0–200)
HDL: 49.6 mg/dL (ref >39.00)
LDL Cholesterol: 129 mg/dL — ABNORMAL HIGH (ref 0–99)
Total CHOL/HDL Ratio: 4
Triglycerides: 109 mg/dL (ref 0.0–149.0)
VLDL: 21.8 mg/dL (ref 0.0–40.0)

## 2011-01-01 LAB — CBC WITH DIFFERENTIAL/PLATELET
Basophils Absolute: 0.1 10*3/uL (ref 0.0–0.1)
Basophils Relative: 0.6 % (ref 0.0–3.0)
Eosinophils Absolute: 0.2 10*3/uL (ref 0.0–0.7)
Eosinophils Relative: 1.9 % (ref 0.0–5.0)
HCT: 48.2 % (ref 39.0–52.0)
Hemoglobin: 16.5 g/dL (ref 13.0–17.0)
Lymphocytes Relative: 23.1 % (ref 12.0–46.0)
Lymphs Abs: 2.7 10*3/uL (ref 0.7–4.0)
MCHC: 34.3 g/dL (ref 30.0–36.0)
MCV: 100.7 fl — ABNORMAL HIGH (ref 78.0–100.0)
Monocytes Absolute: 1.6 10*3/uL — ABNORMAL HIGH (ref 0.1–1.0)
Monocytes Relative: 13.4 % — ABNORMAL HIGH (ref 3.0–12.0)
Neutro Abs: 7.2 10*3/uL (ref 1.4–7.7)
Neutrophils Relative %: 61 % (ref 43.0–77.0)
Platelets: 252 10*3/uL (ref 150.0–400.0)
RBC: 4.78 Mil/uL (ref 4.22–5.81)
RDW: 14.8 % — ABNORMAL HIGH (ref 11.5–14.6)
WBC: 11.8 10*3/uL — ABNORMAL HIGH (ref 4.5–10.5)

## 2011-01-01 LAB — TSH: TSH: 3.01 u[IU]/mL (ref 0.35–5.50)

## 2011-01-01 LAB — URINALYSIS, ROUTINE W REFLEX MICROSCOPIC
Bilirubin Urine: NEGATIVE
Ketones, ur: NEGATIVE
Leukocytes, UA: NEGATIVE
Nitrite: NEGATIVE
Specific Gravity, Urine: 1.015 (ref 1.000–1.030)
Total Protein, Urine: NEGATIVE
Urine Glucose: NEGATIVE
Urobilinogen, UA: 0.2 (ref 0.0–1.0)
pH: 5.5 (ref 5.0–8.0)

## 2011-01-01 LAB — HEPATIC FUNCTION PANEL
ALT: 22 U/L (ref 0–53)
AST: 17 U/L (ref 0–37)
Albumin: 4.1 g/dL (ref 3.5–5.2)
Alkaline Phosphatase: 59 U/L (ref 39–117)
Bilirubin, Direct: 0.2 mg/dL (ref 0.0–0.3)
Total Bilirubin: 0.7 mg/dL (ref 0.3–1.2)
Total Protein: 7.2 g/dL (ref 6.0–8.3)

## 2011-01-01 LAB — BASIC METABOLIC PANEL
BUN: 18 mg/dL (ref 6–23)
CO2: 32 mEq/L (ref 19–32)
Calcium: 9.2 mg/dL (ref 8.4–10.5)
Chloride: 97 mEq/L (ref 96–112)
Creatinine, Ser: 1.1 mg/dL (ref 0.4–1.5)
GFR: 70.36 mL/min (ref >60.00)
Glucose, Bld: 118 mg/dL — ABNORMAL HIGH (ref 70–99)
Potassium: 4.4 mEq/L (ref 3.5–5.1)
Sodium: 137 mEq/L (ref 135–145)

## 2011-01-01 LAB — PSA: PSA: 0.58 ng/mL (ref 0.10–4.00)

## 2011-01-01 NOTE — Patient Instructions (Signed)
Continue all other medications as before Please go to LAB in the Basement for the blood and/or urine tests to be done today Please call the phone number [phone] (the [employer] System) for results of testing in 2-3 days;  When calling, simply dial the number, and when prompted enter the MRN number above (the Medical Record Number) and the # key, then the message should start. Please return in 1 year for your yearly visit, or sooner if needed, with Lab testing done 3-5 days before  

## 2011-01-01 NOTE — Progress Notes (Signed)
Subjective:    Patient ID: LEV CERVONE, male    DOB: 09-Jan-1941, 70 y.o.   MRN: [id number]  HPI  Here for wellness and f/u;  Overall doing ok;  Pt denies CP, worsening SOB, DOE, wheezing, orthopnea, PND, worsening LE edema, palpitations, dizziness or syncope.  Pt denies neurological change such as new Headache, facial or extremity weakness.  Pt denies polydipsia, polyuria, or low sugar symptoms. Pt states overall good compliance with treatment and medications, good tolerability, and trying to follow lower cholesterol diet.  Pt denies worsening depressive symptoms, suicidal ideation or panic. No fever, wt loss, night sweats, loss of appetite, or other constitutional symptoms.  Pt states good ability with ADL's, low fall risk, home safety reviewed and adequate, no significant changes in hearing or vision, and occasionally active with exercise.  Does have several wks ongoing nasal allergy symptoms with clear congestion, itch and sneeze, without fever, pain, ST, cough or wheezing.  Does have sense of ongoing fatigue, but denies signficant hypersomnolence.  Overall edema improved from last visit, still trace. Past Medical History  Diagnosis Date  . BACK PAIN 01/12/2008  . COLONIC POLYPS, HX OF 01/12/2008  . Cough 05/28/2010  . HYPERLIPIDEMIA 01/12/2008  . HYPERTENSION 01/12/2008  . LEUKOCYTOSIS 03/02/2008  . PERIPHERAL EDEMA 06/28/2010  . SWELLING MASS OR LUMP IN HEAD AND NECK 05/28/2010   Past Surgical History  Procedure Date  . Appendectomy   . Cholecystectomy   . Cataract extraction     reports that he has been smoking.  He does not have any smokeless tobacco history on file. He reports that he does not drink alcohol or use illicit drugs. family history includes Cancer in his other; Heart disease in his other; and Hypertension in his other. No Known Allergies Current Outpatient Prescriptions on File Prior to Visit  Medication Sig Dispense Refill  . aspirin 81 MG tablet Take 81 mg by mouth daily.         . Glucosamine 500 MG CAPS Take by mouth daily.        Marland Kitchen losartan-hydrochlorothiazide (HYZAAR) 100-12.5 MG per tablet Take 1 tablet by mouth daily.        . Multiple Vitamin (MULTIVITAMIN) capsule Take 1 capsule by mouth daily.         Review of Systems Review of Systems  Constitutional: Negative for diaphoresis, activity change, appetite change and unexpected weight change.  HENT: Negative for hearing loss, ear pain, facial swelling, mouth sores and neck stiffness.   Eyes: Negative for pain, redness and visual disturbance.  Respiratory: Negative for shortness of breath and wheezing.   Cardiovascular: Negative for chest pain and palpitations.  Gastrointestinal: Negative for diarrhea, blood in stool, abdominal distention and rectal pain.  Genitourinary: Negative for hematuria, flank pain and decreased urine volume.  Musculoskeletal: Negative for myalgias and joint swelling.  Skin: Negative for color change and wound.  Neurological: Negative for syncope and numbness.  Hematological: Negative for adenopathy.  Psychiatric/Behavioral: Negative for hallucinations, self-injury, decreased concentration and agitation.      Objective:   Physical Exam BP 118/70  Pulse 76  Temp(Src) 98.4 F (36.9 C) (Oral)  Ht 5\' 7"  (1.702 m)  Wt 232 lb 4 oz (105.348 kg)  BMI 36.38 kg/m2  SpO2 93% Physical Exam  VS noted Constitutional: Pt is oriented to person, place, and time. Appears well-developed and well-nourished.  HENT:  Head: Normocephalic and atraumatic.  Right Ear: External ear normal.  Left Ear: External ear normal.  Nose: Nose  normal.  Mouth/Throat: Oropharynx is clear and moist.  Eyes: Conjunctivae and EOM are normal. Pupils are equal, round, and reactive to light.  Neck: Normal range of motion. Neck supple. No JVD present. No tracheal deviation present.  Cardiovascular: Normal rate, regular rhythm, normal heart sounds and intact distal pulses.   Pulmonary/Chest: Effort normal and  breath sounds normal.  Abdominal: Soft. Bowel sounds are normal. There is no tenderness.  Musculoskeletal: Normal range of motion. Exhibits trace edema bilat LE's.  Lymphadenopathy:  Has no cervical adenopathy.  Neurological: Pt is alert and oriented to person, place, and time. Pt has normal reflexes. No cranial nerve deficit.  Skin: Skin is warm and dry. No rash noted.  Psychiatric:  Has  normal mood and affect. Behavior is normal.         Assessment & Plan:

## 2011-01-01 NOTE — Assessment & Plan Note (Signed)

## 2011-01-03 ENCOUNTER — Encounter: Payer: Self-pay | Admitting: Internal Medicine

## 2011-01-03 DIAGNOSIS — E119 Type 2 diabetes mellitus without complications: Secondary | ICD-10-CM | POA: Insufficient documentation

## 2011-01-03 LAB — HEMOGLOBIN A1C: Hgb A1c MFr Bld: 7.4 % — ABNORMAL HIGH (ref 4.6–6.5)

## 2011-01-05 ENCOUNTER — Other Ambulatory Visit: Payer: Self-pay | Admitting: Internal Medicine

## 2011-01-05 ENCOUNTER — Encounter: Payer: Self-pay | Admitting: Internal Medicine

## 2011-01-05 MED ORDER — ATORVASTATIN CALCIUM 10 MG PO TABS: 10.0000 mg | ORAL_TABLET | Freq: Every day | ORAL | Status: DC

## 2011-01-05 MED ORDER — METFORMIN HCL 500 MG PO TABS: 500.0000 mg | ORAL_TABLET | Freq: Every day | ORAL | Status: DC

## 2011-01-28 ENCOUNTER — Other Ambulatory Visit (INDEPENDENT_AMBULATORY_CARE_PROVIDER_SITE_OTHER): Payer: 59

## 2011-01-28 DIAGNOSIS — Z0000 Encounter for general adult medical examination without abnormal findings: Secondary | ICD-10-CM

## 2011-01-28 LAB — URINALYSIS, ROUTINE W REFLEX MICROSCOPIC
Bilirubin Urine: NEGATIVE
Ketones, ur: NEGATIVE
Leukocytes, UA: NEGATIVE
Nitrite: NEGATIVE
Specific Gravity, Urine: >=1.03 (ref 1.000–1.030)
Total Protein, Urine: NEGATIVE
Urine Glucose: NEGATIVE
Urobilinogen, UA: 0.2 (ref 0.0–1.0)
pH: 5.5 (ref 5.0–8.0)

## 2011-01-28 LAB — CBC WITH DIFFERENTIAL/PLATELET
Basophils Absolute: 0.1 10*3/uL (ref 0.0–0.1)
Basophils Relative: 0.5 % (ref 0.0–3.0)
Eosinophils Absolute: 0.2 10*3/uL (ref 0.0–0.7)
Eosinophils Relative: 1.9 % (ref 0.0–5.0)
HCT: 48 % (ref 39.0–52.0)
Hemoglobin: 16.5 g/dL (ref 13.0–17.0)
Lymphocytes Relative: 18.5 % (ref 12.0–46.0)
Lymphs Abs: 2.2 10*3/uL (ref 0.7–4.0)
MCHC: 34.3 g/dL (ref 30.0–36.0)
MCV: 100.3 fl — ABNORMAL HIGH (ref 78.0–100.0)
Monocytes Absolute: 1.5 10*3/uL — ABNORMAL HIGH (ref 0.1–1.0)
Monocytes Relative: 12.5 % — ABNORMAL HIGH (ref 3.0–12.0)
Neutro Abs: 8 10*3/uL — ABNORMAL HIGH (ref 1.4–7.7)
Neutrophils Relative %: 66.6 % (ref 43.0–77.0)
Platelets: 286 10*3/uL (ref 150.0–400.0)
RBC: 4.78 Mil/uL (ref 4.22–5.81)
RDW: 14 % (ref 11.5–14.6)
WBC: 12 10*3/uL — ABNORMAL HIGH (ref 4.5–10.5)

## 2011-01-28 LAB — LIPID PANEL
Cholesterol: 183 mg/dL (ref 0–200)
HDL: 46 mg/dL (ref >39.00)
LDL Cholesterol: 113 mg/dL — ABNORMAL HIGH (ref 0–99)
Total CHOL/HDL Ratio: 4
Triglycerides: 120 mg/dL (ref 0.0–149.0)
VLDL: 24 mg/dL (ref 0.0–40.0)

## 2011-01-28 LAB — HEMOGLOBIN A1C: Hgb A1c MFr Bld: 7.6 % — ABNORMAL HIGH (ref 4.6–6.5)

## 2011-01-28 LAB — BASIC METABOLIC PANEL
BUN: 19 mg/dL (ref 6–23)
CO2: 31 mEq/L (ref 19–32)
Calcium: 9.1 mg/dL (ref 8.4–10.5)
Chloride: 104 mEq/L (ref 96–112)
Creatinine, Ser: 1.1 mg/dL (ref 0.4–1.5)
GFR: 72.63 mL/min (ref >60.00)
Glucose, Bld: 188 mg/dL — ABNORMAL HIGH (ref 70–99)
Potassium: 4.2 mEq/L (ref 3.5–5.1)
Sodium: 140 mEq/L (ref 135–145)

## 2011-01-28 LAB — HEPATIC FUNCTION PANEL
ALT: 23 U/L (ref 0–53)
AST: 20 U/L (ref 0–37)
Albumin: 3.9 g/dL (ref 3.5–5.2)
Alkaline Phosphatase: 66 U/L (ref 39–117)
Bilirubin, Direct: 0.2 mg/dL (ref 0.0–0.3)
Total Bilirubin: 0.9 mg/dL (ref 0.3–1.2)
Total Protein: 6.8 g/dL (ref 6.0–8.3)

## 2011-01-28 LAB — TSH: TSH: 3.5 u[IU]/mL (ref 0.35–5.50)

## 2011-01-28 LAB — PSA: PSA: 0.64 ng/mL (ref 0.10–4.00)

## 2011-01-28 NOTE — Progress Notes (Signed)
Add On request faxed to lab/Ok per lab @[phone] 

## 2011-01-31 ENCOUNTER — Ambulatory Visit (INDEPENDENT_AMBULATORY_CARE_PROVIDER_SITE_OTHER): Payer: 59 | Admitting: Internal Medicine

## 2011-01-31 ENCOUNTER — Encounter: Payer: Self-pay | Admitting: Internal Medicine

## 2011-01-31 DIAGNOSIS — Z0000 Encounter for general adult medical examination without abnormal findings: Secondary | ICD-10-CM

## 2011-01-31 DIAGNOSIS — E119 Type 2 diabetes mellitus without complications: Secondary | ICD-10-CM

## 2011-01-31 DIAGNOSIS — I10 Essential (primary) hypertension: Secondary | ICD-10-CM

## 2011-01-31 DIAGNOSIS — E785 Hyperlipidemia, unspecified: Secondary | ICD-10-CM

## 2011-01-31 NOTE — Assessment & Plan Note (Signed)
Uncontrolled, d/w pt risk of elev ldl for heart and stroke, and goal ldl < 70;  Pt declines "more medication"  Lab Results  Component Value Date   LDLCALC 113* 01/28/2011

## 2011-01-31 NOTE — Assessment & Plan Note (Signed)
stable overall by hx and exam, most recent data reviewed with pt, and pt to continue medical treatment as before  BP Readings from Last 3 Encounters:  01/31/11 100/62  01/01/11 118/70  06/28/10 142/72

## 2011-01-31 NOTE — Assessment & Plan Note (Signed)
Mild uncontrolled, but pt wants to work on further wt loss and diet he has been better at for the past month; Continue all other medications as before  Lab Results  Component Value Date   HGBA1C 7.6* 01/28/2011

## 2011-01-31 NOTE — Patient Instructions (Signed)
Continue all other medications as before Please follow lower cholesterol diet Please return in 6 mo with Lab testing done 3-5 days before

## 2011-01-31 NOTE — Progress Notes (Signed)
Subjective:    Patient ID: Vincent Campbell, male    DOB: Jul 05, 1941, 70 y.o.   MRN: [id number]  HPI Here to f/u; overall doing ok,  Pt denies chest pain, increased sob or doe, wheezing, orthopnea, PND, increased LE swelling, palpitations, dizziness or syncope.  Pt denies new neurological symptoms such as new headache, or facial or extremity weakness or numbness   Pt denies polydipsia, polyuria, or low sugar symptoms such as weakness or confusion improved with po intake.  Pt states overall good compliance with meds, trying to follow lower cholesterol, diabetic diet, wt overall down 5 lbs in the past mo, but little exercise however. Admits to 2-3 beers per day.  Still working to pay some bills. States he has aversion to meds since he saw his parents on mult quesitonable meds and did not seem to help, specifically lipitor, and knows about decreased risk of heart dz and stroke Past Medical History  Diagnosis Date  . BACK PAIN 01/12/2008  . COLONIC POLYPS, HX OF 01/12/2008  . Cough 05/28/2010  . HYPERLIPIDEMIA 01/12/2008  . HYPERTENSION 01/12/2008  . LEUKOCYTOSIS 03/02/2008  . PERIPHERAL EDEMA 06/28/2010  . SWELLING MASS OR LUMP IN HEAD AND NECK 05/28/2010   Past Surgical History  Procedure Date  . Appendectomy   . Cholecystectomy   . Cataract extraction     reports that he has been smoking.  He does not have any smokeless tobacco history on file. He reports that he does not drink alcohol or use illicit drugs. family history includes Cancer in his other; Heart disease in his other; and Hypertension in his other. No Known Allergies Current Outpatient Prescriptions on File Prior to Visit  Medication Sig Dispense Refill  . aspirin 81 MG tablet Take 81 mg by mouth daily.        . Glucosamine 500 MG CAPS Take by mouth daily.        Marland Kitchen losartan-hydrochlorothiazide (HYZAAR) 100-12.5 MG per tablet Take 1 tablet by mouth daily.        . metFORMIN (GLUCOPHAGE) 500 MG tablet Take 1 tablet (500 mg total) by mouth  daily.  90 tablet  3  . Multiple Vitamin (MULTIVITAMIN) capsule Take 1 capsule by mouth daily.        . vitamin E 100 UNIT capsule Take 100 Units by mouth daily.        Marland Kitchen atorvastatin (LIPITOR) 10 MG tablet Take 1 tablet (10 mg total) by mouth daily.  90 tablet  3   Review of Systems Review of Systems  Constitutional: Negative for diaphoresis and unexpected weight change.  HENT: Negative for drooling and tinnitus.   Eyes: Negative for photophobia and visual disturbance.  Respiratory: Negative for choking and stridor.   Gastrointestinal: Negative for vomiting and blood in stool.  Genitourinary: Negative for hematuria and decreased urine volume.  Musculoskeletal: Negative for gait problem.       Objective:   Physical Exam BP 100/62  Pulse 76  Temp(Src) 99.4 F (37.4 C) (Oral)  Ht 5\' 7"  (1.702 m)  Wt 227 lb 8 oz (103.193 kg)  BMI 35.63 kg/m2  SpO2 95% Physical Exam  VS noted Constitutional: Pt appears well-developed and well-nourished.  HENT: Head: Normocephalic.  Right Ear: External ear normal.  Left Ear: External ear normal.  Eyes: Conjunctivae and EOM are normal. Pupils are equal, round, and reactive to light.  Neck: Normal range of motion. Neck supple.  Cardiovascular: Normal rate and regular rhythm.   Pulmonary/Chest: Effort normal and  breath sounds normal.  Abd:  Soft, NT, non-distended, + BS Neurological: Pt is alert. No cranial nerve deficit. except severe hearing loss Skin: Skin is warm. No erythema.  Psychiatric: Pt behavior is normal. Thought content normal. Very sharp for his age        Assessment & Plan:

## 2011-05-12 ENCOUNTER — Other Ambulatory Visit: Payer: Self-pay | Admitting: Internal Medicine

## 2011-06-06 ENCOUNTER — Encounter: Payer: Self-pay | Admitting: Gastroenterology

## 2011-08-01 ENCOUNTER — Encounter: Payer: Self-pay | Admitting: Internal Medicine

## 2011-08-01 ENCOUNTER — Ambulatory Visit (INDEPENDENT_AMBULATORY_CARE_PROVIDER_SITE_OTHER): Payer: 59 | Admitting: Internal Medicine

## 2011-08-01 DIAGNOSIS — I10 Essential (primary) hypertension: Secondary | ICD-10-CM | POA: Diagnosis not present

## 2011-08-01 DIAGNOSIS — E119 Type 2 diabetes mellitus without complications: Secondary | ICD-10-CM

## 2011-08-01 DIAGNOSIS — E785 Hyperlipidemia, unspecified: Secondary | ICD-10-CM

## 2011-08-01 NOTE — Patient Instructions (Addendum)
Please continue your efforts at better diet, weight control, and lower cholesterol diet Continue all other medications as before Please return in 6 months, or sooner if needed (the blood work will need to be done on the day of your visit due to your insurance)

## 2011-08-03 ENCOUNTER — Encounter: Payer: Self-pay | Admitting: Internal Medicine

## 2011-08-03 NOTE — Assessment & Plan Note (Addendum)
?   Improved to stable overall by hx and exam, most recent data reviewed with pt, and pt to continue medical treatment as before, declines labs today  Lab Results  Component Value Date   HGBA1C 7.6* 01/28/2011

## 2011-08-03 NOTE — Assessment & Plan Note (Signed)
stable overall by hx and exam, most recent data reviewed with pt, and pt to continue medical treatment as before  BP Readings from Last 3 Encounters:  08/01/11 130/80  01/31/11 100/62  01/01/11 118/70

## 2011-08-03 NOTE — Progress Notes (Signed)
  Subjective:    Patient ID: Vincent Campbell, male    DOB: 07-20-1941, 71 y.o.   MRN: [id number]  HPI  Here to f/u; overall doing ok,  Pt denies chest pain, increased sob or doe, wheezing, orthopnea, PND, increased LE swelling, palpitations, dizziness or syncope.  Pt denies new neurological symptoms such as new headache, or facial or extremity weakness or numbness   Pt denies polydipsia, polyuria, or low sugar symptoms such as weakness or confusion improved with po intake.  Pt states overall good compliance with meds, trying to follow lower cholesterol diet, wt overall stable but little exercise however.  No new complaints, has been working on better diet, lost 18 lbs overall per pt from 228 to 210 by his reckoning.  Never took the lipitor as advised last visit, working on diet. Past Medical History  Diagnosis Date  . BACK PAIN 01/12/2008  . COLONIC POLYPS, HX OF 01/12/2008  . Cough 05/28/2010  . HYPERLIPIDEMIA 01/12/2008  . HYPERTENSION 01/12/2008  . LEUKOCYTOSIS 03/02/2008  . PERIPHERAL EDEMA 06/28/2010  . SWELLING MASS OR LUMP IN HEAD AND NECK 05/28/2010   Past Surgical History  Procedure Date  . Appendectomy   . Cholecystectomy   . Cataract extraction     reports that he has been smoking.  He does not have any smokeless tobacco history on file. He reports that he does not drink alcohol or use illicit drugs. family history includes Cancer in his other; Heart disease in his other; and Hypertension in his other. No Known Allergies Current Outpatient Prescriptions on File Prior to Visit  Medication Sig Dispense Refill  . aspirin 81 MG tablet Take 81 mg by mouth daily.        . Glucosamine 500 MG CAPS Take by mouth daily.        Marland Kitchen losartan-hydrochlorothiazide (HYZAAR) 100-12.5 MG per tablet take 1 tablet by mouth once daily  90 tablet  1  . metFORMIN (GLUCOPHAGE) 500 MG tablet Take 1 tablet (500 mg total) by mouth daily.  90 tablet  3  . Multiple Vitamin (MULTIVITAMIN) capsule Take 1 capsule by  mouth daily.         Review of Systems Review of Systems  Constitutional: Negative for diaphoresis and unexpected weight change.  HENT: Negative for drooling and tinnitus.   Eyes: Negative for photophobia and visual disturbance.  Respiratory: Negative for choking and stridor.   Gastrointestinal: Negative for vomiting and blood in stool.  Genitourinary: Negative for hematuria and decreased urine volume.     Objective:   Physical Exam BP 130/80  Pulse 69  Temp(Src) 98.1 F (36.7 C) (Oral)  Ht 5\' 7"  (1.702 m)  Wt 220 lb 4 oz (99.905 kg)  BMI 34.50 kg/m2  SpO2 92% Physical Exam  VS noted Constitutional: Pt appears well-developed and well-nourished.  HENT: Head: Normocephalic.  Right Ear: External ear normal.  Left Ear: External ear normal.  Eyes: Conjunctivae and EOM are normal. Pupils are equal, round, and reactive to light.  Neck: Normal range of motion. Neck supple.  Cardiovascular: Normal rate and regular rhythm.   Pulmonary/Chest: Effort normal and breath sounds normal.  Neurological: Pt is alert. No cranial nerve deficit.  Skin: Skin is warm. No erythema.  Psychiatric: Pt behavior is normal. Thought content normal.     Assessment & Plan:

## 2011-08-03 NOTE — Assessment & Plan Note (Signed)
?   Improved with die by hx and exam, most recent data reviewed with pt, and pt to continue medical treatment as before , declines labs today, declines lipitor, goal ldl < 70

## 2012-01-07 ENCOUNTER — Other Ambulatory Visit: Payer: Self-pay | Admitting: Internal Medicine

## 2012-02-02 ENCOUNTER — Encounter: Payer: Self-pay | Admitting: Internal Medicine

## 2012-02-02 ENCOUNTER — Ambulatory Visit (INDEPENDENT_AMBULATORY_CARE_PROVIDER_SITE_OTHER): Payer: 59 | Admitting: Internal Medicine

## 2012-02-02 ENCOUNTER — Other Ambulatory Visit (INDEPENDENT_AMBULATORY_CARE_PROVIDER_SITE_OTHER): Payer: 59

## 2012-02-02 VITALS — BP 120/80 | HR 70 | Temp 97.5°F | Ht 67.0 in | Wt 215.8 lb

## 2012-02-02 DIAGNOSIS — IMO0001 Reserved for inherently not codable concepts without codable children: Secondary | ICD-10-CM

## 2012-02-02 DIAGNOSIS — Z0000 Encounter for general adult medical examination without abnormal findings: Secondary | ICD-10-CM

## 2012-02-02 LAB — CBC WITH DIFFERENTIAL/PLATELET
Basophils Absolute: 0 10*3/uL (ref 0.0–0.1)
Basophils Relative: 0.4 % (ref 0.0–3.0)
Eosinophils Absolute: 0.2 10*3/uL (ref 0.0–0.7)
Eosinophils Relative: 2.1 % (ref 0.0–5.0)
HCT: 48.1 % (ref 39.0–52.0)
Hemoglobin: 16 g/dL (ref 13.0–17.0)
Lymphocytes Relative: 18.9 % (ref 12.0–46.0)
Lymphs Abs: 2 10*3/uL (ref 0.7–4.0)
MCHC: 33.2 g/dL (ref 30.0–36.0)
MCV: 101.6 fl — ABNORMAL HIGH (ref 78.0–100.0)
Monocytes Absolute: 1.6 10*3/uL — ABNORMAL HIGH (ref 0.1–1.0)
Monocytes Relative: 15 % — ABNORMAL HIGH (ref 3.0–12.0)
Neutro Abs: 6.9 10*3/uL (ref 1.4–7.7)
Neutrophils Relative %: 63.6 % (ref 43.0–77.0)
Platelets: 259 10*3/uL (ref 150.0–400.0)
RBC: 4.74 Mil/uL (ref 4.22–5.81)
RDW: 14.9 % — ABNORMAL HIGH (ref 11.5–14.6)
WBC: 10.8 10*3/uL — ABNORMAL HIGH (ref 4.5–10.5)

## 2012-02-02 LAB — PSA: PSA: 0.96 ng/mL (ref 0.10–4.00)

## 2012-02-02 LAB — URINALYSIS, ROUTINE W REFLEX MICROSCOPIC
Bilirubin Urine: NEGATIVE
Ketones, ur: NEGATIVE
Leukocytes, UA: NEGATIVE
Nitrite: NEGATIVE
Specific Gravity, Urine: >=1.03 (ref 1.000–1.030)
Total Protein, Urine: NEGATIVE
Urine Glucose: NEGATIVE
Urobilinogen, UA: 1 (ref 0.0–1.0)
pH: 5.5 (ref 5.0–8.0)

## 2012-02-02 LAB — HEMOGLOBIN A1C: Hgb A1c MFr Bld: 6.6 % — ABNORMAL HIGH (ref 4.6–6.5)

## 2012-02-02 LAB — LIPID PANEL
Cholesterol: 151 mg/dL (ref 0–200)
HDL: 48.9 mg/dL (ref >39.00)
LDL Cholesterol: 78 mg/dL (ref 0–99)
Total CHOL/HDL Ratio: 3
Triglycerides: 122 mg/dL (ref 0.0–149.0)
VLDL: 24.4 mg/dL (ref 0.0–40.0)

## 2012-02-02 LAB — BASIC METABOLIC PANEL
BUN: 19 mg/dL (ref 6–23)
CO2: 30 mEq/L (ref 19–32)
Calcium: 9 mg/dL (ref 8.4–10.5)
Chloride: 101 mEq/L (ref 96–112)
Creatinine, Ser: 0.9 mg/dL (ref 0.4–1.5)
GFR: 94.45 mL/min (ref >60.00)
Glucose, Bld: 128 mg/dL — ABNORMAL HIGH (ref 70–99)
Potassium: 4.8 mEq/L (ref 3.5–5.1)
Sodium: 138 mEq/L (ref 135–145)

## 2012-02-02 LAB — MICROALBUMIN / CREATININE URINE RATIO
Creatinine,U: 245.4 mg/dL
Microalb Creat Ratio: 0.3 mg/g (ref 0.0–30.0)
Microalb, Ur: 0.8 mg/dL (ref 0.0–1.9)

## 2012-02-02 LAB — HEPATIC FUNCTION PANEL
ALT: 22 U/L (ref 0–53)
AST: 21 U/L (ref 0–37)
Albumin: 3.9 g/dL (ref 3.5–5.2)
Alkaline Phosphatase: 55 U/L (ref 39–117)
Bilirubin, Direct: 0.2 mg/dL (ref 0.0–0.3)
Total Bilirubin: 1.2 mg/dL (ref 0.3–1.2)
Total Protein: 6.9 g/dL (ref 6.0–8.3)

## 2012-02-02 LAB — TSH: TSH: 3.64 u[IU]/mL (ref 0.35–5.50)

## 2012-02-02 NOTE — Assessment & Plan Note (Signed)
Overall doing well, age appropriate education and counseling updated, referrals for preventative services and immunizations addressed, dietary and smoking counseling addressed, most recent labs and ECG reviewed.  I have personally reviewed and have noted: 1) the patient's medical and social history 2) The pt's use of alcohol, tobacco, and illicit drugs 3) The patient's current medications and supplements 4) Functional ability including ADL's, fall risk, home safety risk, hearing and visual impairment 5) Diet and physical activities 6) Evidence for depression or mood disorder 7) The patient's height, weight, and BMI have been recorded in the chart I have made referrals, and provided counseling and education based on review of the above Declines pneumovax

## 2012-02-02 NOTE — Patient Instructions (Addendum)
Continue all other medications as before; your medications were refilled today Please have the pharmacy call with any other refills you may need. Please go to LAB in the Basement for the blood and/or urine tests to be done today You will be contacted by phone if any changes need to be made immediately.  Otherwise, you will receive a letter about your results with an explanation. Please return in 6 mo with Lab testing done 3-5 days before

## 2012-02-02 NOTE — Progress Notes (Signed)
Subjective:    Patient ID: Vincent Campbell, male    DOB: 1940-11-19, 71 y.o.   MRN: [id number]  HPI  Here for wellness and f/u;  Overall doing ok;  Pt denies CP, worsening SOB, DOE, wheezing, orthopnea, PND, worsening LE edema, palpitations, dizziness or syncope.  Pt denies neurological change such as new Headache, facial or extremity weakness.  Pt denies polydipsia, polyuria, or low sugar symptoms. Pt states overall good compliance with treatment and medications, good tolerability, and trying to follow lower cholesterol diet.  Pt denies worsening depressive symptoms, suicidal ideation or panic. No fever, wt loss, night sweats, loss of appetite, or other constitutional symptoms.  Pt states good ability with ADL's, low fall risk, home safety reviewed and adequate, no significant changes in hearing or vision, and occasionally active with exercise, peak wt was 288 recently , now down to 215 here with better diet.  Has to date resisted oral meds for DM and chol - "I dont want to take handfuls of pills."  Now using 3cigs at 3-4 per wk with occas cigarrette only (wife works for [employer]).  Is not currently taking the lipitor or glucophage Past Medical History  Diagnosis Date  . BACK PAIN 01/12/2008  . COLONIC POLYPS, HX OF 01/12/2008  . Cough 05/28/2010  . HYPERLIPIDEMIA 01/12/2008  . HYPERTENSION 01/12/2008  . LEUKOCYTOSIS 03/02/2008  . PERIPHERAL EDEMA 06/28/2010  . SWELLING MASS OR LUMP IN HEAD AND NECK 05/28/2010   Past Surgical History  Procedure Date  . Appendectomy   . Cholecystectomy   . Cataract extraction     reports that he has been smoking.  He does not have any smokeless tobacco history on file. He reports that he does not drink alcohol or use illicit drugs. family history includes Cancer in his other; Heart disease in his other; and Hypertension in his other. No Known Allergies \ Current Outpatient Prescriptions on File Prior to Visit  Medication Sig Dispense Refill  . aspirin 81 MG  tablet Take 81 mg by mouth daily.        . Glucosamine 500 MG CAPS Take by mouth daily.        Marland Kitchen losartan-hydrochlorothiazide (HYZAAR) 100-12.5 MG per tablet take 1 tablet by mouth once daily  90 tablet  1  . Multiple Vitamin (MULTIVITAMIN) capsule Take 1 capsule by mouth daily.        Marland Kitchen atorvastatin (LIPITOR) 10 MG tablet take 1 tablet by mouth once daily  90 tablet  2  . metFORMIN (GLUCOPHAGE) 500 MG tablet Take 1 tablet (500 mg total) by mouth daily.  90 tablet  3   Review of Systems Review of Systems  Constitutional: Negative for diaphoresis, activity change, appetite change and unexpected weight change.  HENT: Negative for hearing loss, ear pain, facial swelling, mouth sores and neck stiffness.   Eyes: Negative for pain, redness and visual disturbance.  Respiratory: Negative for shortness of breath and wheezing.   Cardiovascular: Negative for chest pain and palpitations.  Gastrointestinal: Negative for diarrhea, blood in stool, abdominal distention and rectal pain.  Genitourinary: Negative for hematuria, flank pain and decreased urine volume.  Musculoskeletal: Negative for myalgias and joint swelling.  Skin: Negative for color change and wound.  Neurological: Negative for syncope and numbness.  Hematological: Negative for adenopathy.  Psychiatric/Behavioral: Negative for hallucinations, self-injury, decreased concentration and agitation.     Objective:   Physical Exam BP 120/80  Pulse 70  Temp 97.5 F (36.4 C) (Oral)  Ht 5\' 7"  (  1.702 m)  Wt 215 lb 12 oz (97.864 kg)  BMI 33.79 kg/m2  SpO2 94% Physical Exam  VS noted Constitutional: Pt is oriented to person, place, and time. Appears well-developed and well-nourished.  HENT:  Head: Normocephalic and atraumatic.  Right Ear: External ear normal.  Left Ear: External ear normal.  Nose: Nose normal.  Mouth/Throat: Oropharynx is clear and moist.  Eyes: Conjunctivae and EOM are normal. Pupils are equal, round, and reactive to  light.  Neck: Normal range of motion. Neck supple. No JVD present. No tracheal deviation present.  Cardiovascular: Normal rate, regular rhythm, normal heart sounds and intact distal pulses.   Pulmonary/Chest: Effort normal and breath sounds normal.  Abdominal: Soft. Bowel sounds are normal. There is no tenderness.  Musculoskeletal: Normal range of motion. Exhibits no edema.  Lymphadenopathy:  Has no cervical adenopathy.  Neurological: Pt is alert and oriented to person, place, and time. Pt has normal reflexes. No cranial nerve deficit.  Skin: Skin is warm and dry. No rash noted.  Psychiatric:  Has  normal mood and affect. Behavior is normal.     Assessment & Plan:

## 2012-02-02 NOTE — Assessment & Plan Note (Signed)
stable overall by hx and exam, most recent data reviewed with pt, and pt to continue medical treatment as before For labs today Lab Results  Component Value Date   HGBA1C 7.6* 01/28/2011

## 2012-04-16 ENCOUNTER — Encounter: Payer: Self-pay | Admitting: Gastroenterology

## 2012-08-02 ENCOUNTER — Ambulatory Visit (INDEPENDENT_AMBULATORY_CARE_PROVIDER_SITE_OTHER): Payer: 59 | Admitting: Internal Medicine

## 2012-08-02 ENCOUNTER — Encounter: Payer: Self-pay | Admitting: Internal Medicine

## 2012-08-02 ENCOUNTER — Other Ambulatory Visit (INDEPENDENT_AMBULATORY_CARE_PROVIDER_SITE_OTHER): Payer: 59

## 2012-08-02 VITALS — BP 142/90 | HR 72 | Temp 97.7°F | Ht 67.5 in | Wt 228.2 lb

## 2012-08-02 DIAGNOSIS — Z0000 Encounter for general adult medical examination without abnormal findings: Secondary | ICD-10-CM | POA: Diagnosis not present

## 2012-08-02 DIAGNOSIS — IMO0001 Reserved for inherently not codable concepts without codable children: Secondary | ICD-10-CM

## 2012-08-02 DIAGNOSIS — R251 Tremor, unspecified: Secondary | ICD-10-CM

## 2012-08-02 DIAGNOSIS — I10 Essential (primary) hypertension: Secondary | ICD-10-CM

## 2012-08-02 DIAGNOSIS — R259 Unspecified abnormal involuntary movements: Secondary | ICD-10-CM

## 2012-08-02 DIAGNOSIS — E785 Hyperlipidemia, unspecified: Secondary | ICD-10-CM

## 2012-08-02 HISTORY — DX: Tremor, unspecified: R25.1

## 2012-08-02 LAB — BASIC METABOLIC PANEL
BUN: 17 mg/dL (ref 6–23)
CO2: 30 mEq/L (ref 19–32)
Calcium: 8.6 mg/dL (ref 8.4–10.5)
Chloride: 98 mEq/L (ref 96–112)
Creatinine, Ser: 1 mg/dL (ref 0.4–1.5)
GFR: 78.19 mL/min (ref >60.00)
Glucose, Bld: 192 mg/dL — ABNORMAL HIGH (ref 70–99)
Potassium: 3.8 mEq/L (ref 3.5–5.1)
Sodium: 137 mEq/L (ref 135–145)

## 2012-08-02 LAB — HEMOGLOBIN A1C: Hgb A1c MFr Bld: 6.9 % — ABNORMAL HIGH (ref 4.6–6.5)

## 2012-08-02 LAB — LIPID PANEL
Cholesterol: 137 mg/dL (ref 0–200)
HDL: 59 mg/dL (ref >39.00)
LDL Cholesterol: 62 mg/dL (ref 0–99)
Total CHOL/HDL Ratio: 2
Triglycerides: 81 mg/dL (ref 0.0–149.0)
VLDL: 16.2 mg/dL (ref 0.0–40.0)

## 2012-08-02 MED ORDER — PROPRANOLOL HCL 10 MG PO TABS: 10.0000 mg | ORAL_TABLET | Freq: Every day | ORAL | Status: DC

## 2012-08-02 NOTE — Patient Instructions (Addendum)
Take all new medications as prescribed - the propranolol Continue all other medications as before Please go to LAB in the Basement for the blood and/or urine tests to be done today You will be contacted by phone if any changes need to be made immediately.  Otherwise, you will receive a letter about your results with an explanation, but please check with MyChart first. Thank you for enrolling in MyChart. Please follow the instructions below to securely access your online medical record. MyChart allows you to send messages to your doctor, view your test results, renew your prescriptions, schedule appointments, and more. To Log into MyChart, please go to [email or web address], and your Username is: [email or web address] Please return in 6 mo with Lab testing done 3-5 days before

## 2012-08-02 NOTE — Assessment & Plan Note (Signed)
stable overall by hx and exam, most recent data reviewed with pt, and pt to continue medical treatment as before Lab Results  Component Value Date   LDLCALC 62 08/02/2012

## 2012-08-02 NOTE — Assessment & Plan Note (Signed)
stable overall by hx and exam, most recent data reviewed with pt, and pt to continue medical treatment as before BP Readings from Last 3 Encounters:  08/02/12 142/90  02/02/12 120/80  08/01/11 130/80

## 2012-08-02 NOTE — Assessment & Plan Note (Signed)
stable overall by hx and exam, most recent data reviewed with pt, and pt to continue medical treatment as before Lab Results  Component Value Date   HGBA1C 6.9* 08/02/2012

## 2012-08-02 NOTE — Assessment & Plan Note (Signed)
C/w essential - for propranolol 10 qd

## 2012-08-02 NOTE — Progress Notes (Signed)
Subjective:    Patient ID: Vincent Campbell, male    DOB: Jan 27, 1941, 72 y.o.   MRN: [id number]  HPI  Here to f/u; overall doing ok,  Pt denies chest pain, increased sob or doe, wheezing, orthopnea, PND, increased LE swelling, palpitations, dizziness or syncope.  Pt denies new neurological symptoms such as new headache, or facial or extremity weakness or numbness   Pt denies polydipsia, polyuria, or low sugar symptoms such as weakness or confusion improved with po intake.  Pt states overall good compliance with meds, trying to follow lower cholesterol, diabetic diet, wt overall stable but little exercise however. Has a neurologist friend who suggested propranolol 10 mg for his worsening LUE distal tremor Past Medical History  Diagnosis Date  . BACK PAIN 01/12/2008  . COLONIC POLYPS, HX OF 01/12/2008  . Cough 05/28/2010  . HYPERLIPIDEMIA 01/12/2008  . HYPERTENSION 01/12/2008  . LEUKOCYTOSIS 03/02/2008  . PERIPHERAL EDEMA 06/28/2010  . SWELLING MASS OR LUMP IN HEAD AND NECK 05/28/2010  . Tremor 08/02/2012   Past Surgical History  Procedure Date  . Appendectomy   . Cholecystectomy   . Cataract extraction     reports that he has been smoking.  He does not have any smokeless tobacco history on file. He reports that he does not drink alcohol or use illicit drugs. family history includes Cancer in his other; Heart disease in his other; and Hypertension in his other. No Known Allergies Current Outpatient Prescriptions on File Prior to Visit  Medication Sig Dispense Refill  . aspirin 81 MG tablet Take 81 mg by mouth daily.        . Glucosamine 500 MG CAPS Take by mouth daily.        Marland Kitchen losartan-hydrochlorothiazide (HYZAAR) 100-12.5 MG per tablet take 1 tablet by mouth once daily  90 tablet  1  . Multiple Vitamin (MULTIVITAMIN) capsule Take 1 capsule by mouth daily.        Marland Kitchen atorvastatin (LIPITOR) 10 MG tablet take 1 tablet by mouth once daily  90 tablet  2  . metFORMIN (GLUCOPHAGE) 500 MG tablet Take 1  tablet (500 mg total) by mouth daily.  90 tablet  3  . propranolol (INDERAL) 10 MG tablet Take 1 tablet (10 mg total) by mouth daily.  90 tablet  3    Review of Systems  Constitutional: Negative for diaphoresis and unexpected weight change.  HENT: Negative for tinnitus.   Eyes: Negative for photophobia and visual disturbance.  Respiratory: Negative for choking and stridor.   Gastrointestinal: Negative for vomiting and blood in stool.  Genitourinary: Negative for hematuria and decreased urine volume.  Musculoskeletal: Negative for gait problem.  Skin: Negative for color change and wound.  Neurological: Negative for tremors and numbness.  Psychiatric/Behavioral: Negative for decreased concentration. The patient is not hyperactive.       Objective:   Physical Exam BP 142/90  Pulse 72  Temp 97.7 F (36.5 C) (Oral)  Ht 5' 7.5" (1.715 m)  Wt 228 lb 4 oz (103.534 kg)  BMI 35.22 kg/m2  SpO2 96% Physical Exam  VS noted Constitutional: Pt appears well-developed and well-nourished.  HENT: Head: Normocephalic.  Right Ear: External ear normal.  Left Ear: External ear normal.  Eyes: Conjunctivae and EOM are normal. Pupils are equal, round, and reactive to light.  Neck: Normal range of motion. Neck supple.  Cardiovascular: Normal rate and regular rhythm.   Pulmonary/Chest: Effort normal and breath sounds normal.  Neurological: Pt is alert. Not confused  Skin: Skin is warm. No erythema.  Psychiatric: Pt behavior is normal. Thought content normal.     Assessment & Plan:

## 2012-09-11 ENCOUNTER — Other Ambulatory Visit: Payer: Self-pay

## 2012-12-04 ENCOUNTER — Other Ambulatory Visit: Payer: Self-pay | Admitting: Internal Medicine

## 2013-01-31 ENCOUNTER — Other Ambulatory Visit (INDEPENDENT_AMBULATORY_CARE_PROVIDER_SITE_OTHER): Payer: 59

## 2013-01-31 DIAGNOSIS — IMO0001 Reserved for inherently not codable concepts without codable children: Secondary | ICD-10-CM | POA: Diagnosis not present

## 2013-01-31 DIAGNOSIS — Z0000 Encounter for general adult medical examination without abnormal findings: Secondary | ICD-10-CM | POA: Diagnosis not present

## 2013-01-31 LAB — URINALYSIS, ROUTINE W REFLEX MICROSCOPIC
Hgb urine dipstick: NEGATIVE
Leukocytes, UA: NEGATIVE
Nitrite: NEGATIVE
Specific Gravity, Urine: >=1.03 (ref 1.000–1.030)
Total Protein, Urine: NEGATIVE
Urine Glucose: NEGATIVE
Urobilinogen, UA: 0.2 (ref 0.0–1.0)
pH: 5.5 (ref 5.0–8.0)

## 2013-01-31 LAB — BASIC METABOLIC PANEL
BUN: 18 mg/dL (ref 6–23)
CO2: 31 mEq/L (ref 19–32)
Calcium: 9 mg/dL (ref 8.4–10.5)
Chloride: 100 mEq/L (ref 96–112)
Creatinine, Ser: 1 mg/dL (ref 0.4–1.5)
GFR: 78.99 mL/min (ref >60.00)
Glucose, Bld: 190 mg/dL — ABNORMAL HIGH (ref 70–99)
Potassium: 4.2 mEq/L (ref 3.5–5.1)
Sodium: 136 mEq/L (ref 135–145)

## 2013-01-31 LAB — HEPATIC FUNCTION PANEL
ALT: 25 U/L (ref 0–53)
AST: 21 U/L (ref 0–37)
Albumin: 3.8 g/dL (ref 3.5–5.2)
Alkaline Phosphatase: 55 U/L (ref 39–117)
Bilirubin, Direct: 0.2 mg/dL (ref 0.0–0.3)
Total Bilirubin: 0.9 mg/dL (ref 0.3–1.2)
Total Protein: 6.5 g/dL (ref 6.0–8.3)

## 2013-01-31 LAB — CBC WITH DIFFERENTIAL/PLATELET
Basophils Absolute: 0 10*3/uL (ref 0.0–0.1)
Basophils Relative: 0.5 % (ref 0.0–3.0)
Eosinophils Absolute: 0.2 10*3/uL (ref 0.0–0.7)
Eosinophils Relative: 1.8 % (ref 0.0–5.0)
HCT: 47.9 % (ref 39.0–52.0)
Hemoglobin: 16.1 g/dL (ref 13.0–17.0)
Lymphocytes Relative: 21.3 % (ref 12.0–46.0)
Lymphs Abs: 2.1 10*3/uL (ref 0.7–4.0)
MCHC: 33.6 g/dL (ref 30.0–36.0)
MCV: 101.3 fl — ABNORMAL HIGH (ref 78.0–100.0)
Monocytes Absolute: 1.1 10*3/uL — ABNORMAL HIGH (ref 0.1–1.0)
Monocytes Relative: 11.5 % (ref 3.0–12.0)
Neutro Abs: 6.4 10*3/uL (ref 1.4–7.7)
Neutrophils Relative %: 64.9 % (ref 43.0–77.0)
Platelets: 272 10*3/uL (ref 150.0–400.0)
RBC: 4.73 Mil/uL (ref 4.22–5.81)
RDW: 14.2 % (ref 11.5–14.6)
WBC: 9.9 10*3/uL (ref 4.5–10.5)

## 2013-01-31 LAB — LIPID PANEL
Cholesterol: 136 mg/dL (ref 0–200)
HDL: 49.2 mg/dL (ref >39.00)
LDL Cholesterol: 63 mg/dL (ref 0–99)
Total CHOL/HDL Ratio: 3
Triglycerides: 118 mg/dL (ref 0.0–149.0)
VLDL: 23.6 mg/dL (ref 0.0–40.0)

## 2013-01-31 LAB — MICROALBUMIN / CREATININE URINE RATIO
Creatinine,U: 291 mg/dL
Microalb Creat Ratio: 0.5 mg/g (ref 0.0–30.0)
Microalb, Ur: 1.5 mg/dL (ref 0.0–1.9)

## 2013-01-31 LAB — HEMOGLOBIN A1C: Hgb A1c MFr Bld: 6.8 % — ABNORMAL HIGH (ref 4.6–6.5)

## 2013-01-31 LAB — PSA: PSA: 0.7 ng/mL (ref 0.10–4.00)

## 2013-02-01 LAB — TSH: TSH: 3.29 u[IU]/mL (ref 0.35–5.50)

## 2013-02-03 ENCOUNTER — Ambulatory Visit (INDEPENDENT_AMBULATORY_CARE_PROVIDER_SITE_OTHER): Payer: 59 | Admitting: Internal Medicine

## 2013-02-03 ENCOUNTER — Encounter: Payer: Self-pay | Admitting: Internal Medicine

## 2013-02-03 VITALS — BP 142/80 | HR 53 | Temp 98.1°F | Ht 67.5 in | Wt 216.2 lb

## 2013-02-03 DIAGNOSIS — IMO0001 Reserved for inherently not codable concepts without codable children: Secondary | ICD-10-CM

## 2013-02-03 DIAGNOSIS — Z0000 Encounter for general adult medical examination without abnormal findings: Secondary | ICD-10-CM

## 2013-02-03 DIAGNOSIS — Z23 Encounter for immunization: Secondary | ICD-10-CM

## 2013-02-03 DIAGNOSIS — R251 Tremor, unspecified: Secondary | ICD-10-CM

## 2013-02-03 MED ORDER — PROPRANOLOL HCL 10 MG PO TABS: 10.0000 mg | ORAL_TABLET | Freq: Every day | ORAL | Status: DC

## 2013-02-03 MED ORDER — PROPRANOLOL HCL 10 MG PO TABS: 10.0000 mg | ORAL_TABLET | Freq: Two times a day (BID) | ORAL | Status: DC

## 2013-02-03 MED ORDER — LOSARTAN POTASSIUM-HCTZ 100-12.5 MG PO TABS: ORAL_TABLET | ORAL | Status: DC

## 2013-02-03 NOTE — Assessment & Plan Note (Signed)

## 2013-02-03 NOTE — Patient Instructions (Addendum)
OK to increase the propranolol to 10 mg twice per day for blood pressure and tremor Ok to stay off the lipitor Please continue all other medications as before, and refills have been done if requested. Please have the pharmacy call with any other refills you may need. Please continue your efforts at being more active, low cholesterol diet, and weight control. Please stop smoking You had the tetanus and pneumonia shots today You will be contacted regarding the referral for: colonoscopy You are otherwise up to date with prevention measures today.  Please remember to sign up for My Chart if you have not done so, as this will be important to you in the future with finding out test results, communicating by private email, and scheduling acute appointments online when needed.  Please return in 6 months, or sooner if needed, with Lab testing done 3-5 days before

## 2013-02-03 NOTE — Progress Notes (Addendum)
Subjective:    Patient ID: Vincent Campbell, male    DOB: [birth year], 72 y.o.   MRN: [id number]  HPI Here for wellness and f/u;  Overall doing ok;  Pt denies CP, worsening SOB, DOE, wheezing, orthopnea, PND, worsening LE edema, palpitations, dizziness or syncope.  Pt denies neurological change such as new headache, facial or extremity weakness.  Pt denies polydipsia, polyuria, or low sugar symptoms. Pt states overall good compliance with treatment and medications, good tolerability, and has been trying to follow lower cholesterol diet.  Pt denies worsening depressive symptoms, suicidal ideation or panic. No fever, night sweats, wt loss, loss of appetite, or other constitutional symptoms.  Pt states good ability with ADL's, has low fall risk, home safety reviewed and adequate, no other significant changes in hearing or vision, and only occasionally active with exercise.  Still with small LUE distal tremor, though better with the once daily inderal. More stress recently with wife illness/breast cancer. Has ongoing severe hearing loss bilat, and just cant stand wearing the two $3000 hearing aids.  Due for shots today, and colonoscopy.  Still smoking, not interested in quitting.  Drinking now only 1-2 drinks per day.  Not taking the lipitor as he has been really working on the diet, and walks 3times per wk, hard to lose wt though.   Past Medical History  Diagnosis Date  . BACK PAIN 01/12/2008  . COLONIC POLYPS, HX OF 01/12/2008  . Cough 05/28/2010  . HYPERLIPIDEMIA 01/12/2008  . HYPERTENSION 01/12/2008  . LEUKOCYTOSIS 03/02/2008  . PERIPHERAL EDEMA 06/28/2010  . SWELLING MASS OR LUMP IN HEAD AND NECK 05/28/2010  . Tremor 08/02/2012   Past Surgical History  Procedure Laterality Date  . Appendectomy    . Cholecystectomy    . Cataract extraction      reports that he has been smoking.  He does not have any smokeless tobacco history on file. He reports that he does not drink alcohol or use illicit drugs. family  history includes Cancer in his other; Heart disease in his other; and Hypertension in his other. No Known Allergies Current Outpatient Prescriptions on File Prior to Visit  Medication Sig Dispense Refill  . aspirin 81 MG tablet Take 81 mg by mouth daily.        . Glucosamine 500 MG CAPS Take by mouth daily.        . Multiple Vitamin (MULTIVITAMIN) capsule Take 1 capsule by mouth daily.        . metFORMIN (GLUCOPHAGE) 500 MG tablet Take 1 tablet (500 mg total) by mouth daily.  90 tablet  3   No current facility-administered medications on file prior to visit.   Review of Systems Constitutional: Negative for diaphoresis, activity change, appetite change or unexpected weight change.  HENT: Negative for hearing loss, ear pain, facial swelling, mouth sores and neck stiffness.   Eyes: Negative for pain, redness and visual disturbance.  Respiratory: Negative for shortness of breath and wheezing.   Cardiovascular: Negative for chest pain and palpitations.  Gastrointestinal: Negative for diarrhea, blood in stool, abdominal distention or other pain Genitourinary: Negative for hematuria, flank pain or change in urine volume.  Musculoskeletal: Negative for myalgias and joint swelling.  Skin: Negative for color change and wound.  Neurological: Negative for syncope and numbness. other than noted Hematological: Negative for adenopathy.  Psychiatric/Behavioral: Negative for hallucinations, self-injury, decreased concentration and agitation.      Objective:   Physical Exam BP 142/80  Pulse 53  Temp(Src) 98.1  F (36.7 C) (Oral)  Ht 5' 7.5" (1.715 m)  Wt 216 lb 4 oz (98.09 kg)  BMI 33.35 kg/m2  SpO2 93% VS noted, not ill appaering Constitutional: Pt is oriented to person, place, and time. Appears well-developed and well-nourished. /severe obese Head: Normocephalic and atraumatic.  Right Ear: External ear normal.  Left Ear: External ear normal.  Nose: Nose normal.  Mouth/Throat: Oropharynx is  clear and moist.  Eyes: Conjunctivae and EOM are normal. Pupils are equal, round, and reactive to light.  Neck: Normal range of motion. Neck supple. No JVD present. No tracheal deviation present.  Cardiovascular: Normal rate, regular rhythm, normal heart sounds and intact distal pulses.   Pulmonary/Chest: Effort normal and breath sounds normal.  Abdominal: Soft. Bowel sounds are normal. There is no tenderness. No HSM  Musculoskeletal: Normal range of motion. Exhibits no edema.  Lymphadenopathy:  Has no cervical adenopathy.  Neurological: Pt is alert and oriented to person, place, and time. Pt has normal reflexes. No cranial nerve deficit. Motor 5/5, small tremor noted LUE Skin: Skin is warm and dry. No rash noted.  Psychiatric:  Has  normal mood and affect. Behavior is normal. except for ? Mild dysphoric    Assessment & Plan:  Quality Measures addressed:  Pneumonia Vaccine: pt declines, may self-refer to local pharmacy Diabetes Blood Pressure < 140/90: pt declines further medication change at this time Diabetes LDL < 100: pt declines further medication Diabetes Tobacco non-use: pt declines to quit smoking CHF B-blocker tx for LVSD: pt declines further medication CAD  - drug therapy for lower cholesterol: pt declines further medication ACE/ARB therapy for CAD, Diabetes, and/or LVSD: pt declines further medication

## 2013-02-03 NOTE — Addendum Note (Signed)
Addended by: Scharlene Gloss B on: 02/03/2013 10:20 AM   Modules accepted: Orders

## 2013-02-03 NOTE — Assessment & Plan Note (Signed)
Ok for incr propranolo to 10 bid

## 2013-03-02 ENCOUNTER — Other Ambulatory Visit: Payer: Self-pay

## 2013-03-04 DIAGNOSIS — H26499 Other secondary cataract, unspecified eye: Secondary | ICD-10-CM | POA: Diagnosis not present

## 2013-04-20 ENCOUNTER — Encounter: Payer: Self-pay | Admitting: *Deleted

## 2013-04-30 ENCOUNTER — Other Ambulatory Visit: Payer: Self-pay | Admitting: Internal Medicine

## 2013-06-02 ENCOUNTER — Other Ambulatory Visit: Payer: Self-pay

## 2013-08-05 ENCOUNTER — Other Ambulatory Visit (INDEPENDENT_AMBULATORY_CARE_PROVIDER_SITE_OTHER): Payer: 59

## 2013-08-05 DIAGNOSIS — IMO0001 Reserved for inherently not codable concepts without codable children: Secondary | ICD-10-CM

## 2013-08-05 DIAGNOSIS — E1165 Type 2 diabetes mellitus with hyperglycemia: Principal | ICD-10-CM

## 2013-08-05 LAB — BASIC METABOLIC PANEL
BUN: 17 mg/dL (ref 6–23)
CO2: 32 mEq/L (ref 19–32)
Calcium: 8.7 mg/dL (ref 8.4–10.5)
Chloride: 101 mEq/L (ref 96–112)
Creatinine, Ser: 1 mg/dL (ref 0.4–1.5)
GFR: 76.21 mL/min (ref >60.00)
Glucose, Bld: 109 mg/dL — ABNORMAL HIGH (ref 70–99)
Potassium: 5.3 mEq/L — ABNORMAL HIGH (ref 3.5–5.1)
Sodium: 138 mEq/L (ref 135–145)

## 2013-08-05 LAB — LIPID PANEL
Cholesterol: 173 mg/dL (ref 0–200)
HDL: 59.4 mg/dL (ref >39.00)
LDL Cholesterol: 99 mg/dL (ref 0–99)
Total CHOL/HDL Ratio: 3
Triglycerides: 72 mg/dL (ref 0.0–149.0)
VLDL: 14.4 mg/dL (ref 0.0–40.0)

## 2013-08-05 LAB — HEPATIC FUNCTION PANEL
ALT: 18 U/L (ref 0–53)
AST: 19 U/L (ref 0–37)
Albumin: 4.1 g/dL (ref 3.5–5.2)
Alkaline Phosphatase: 61 U/L (ref 39–117)
Bilirubin, Direct: 0.3 mg/dL (ref 0.0–0.3)
Total Bilirubin: 1.1 mg/dL (ref 0.3–1.2)
Total Protein: 7.3 g/dL (ref 6.0–8.3)

## 2013-08-05 LAB — HEMOGLOBIN A1C: Hgb A1c MFr Bld: 6.8 % — ABNORMAL HIGH (ref 4.6–6.5)

## 2013-08-08 ENCOUNTER — Ambulatory Visit: Payer: 59 | Admitting: Internal Medicine

## 2013-08-11 ENCOUNTER — Ambulatory Visit (INDEPENDENT_AMBULATORY_CARE_PROVIDER_SITE_OTHER): Payer: 59 | Admitting: Internal Medicine

## 2013-08-11 ENCOUNTER — Encounter: Payer: Self-pay | Admitting: Internal Medicine

## 2013-08-11 VITALS — BP 152/82 | HR 65 | Temp 97.8°F | Ht 67.5 in | Wt 215.2 lb

## 2013-08-11 DIAGNOSIS — R4589 Other symptoms and signs involving emotional state: Secondary | ICD-10-CM | POA: Insufficient documentation

## 2013-08-11 DIAGNOSIS — IMO0001 Reserved for inherently not codable concepts without codable children: Secondary | ICD-10-CM | POA: Diagnosis not present

## 2013-08-11 DIAGNOSIS — E785 Hyperlipidemia, unspecified: Secondary | ICD-10-CM

## 2013-08-11 DIAGNOSIS — F17200 Nicotine dependence, unspecified, uncomplicated: Secondary | ICD-10-CM

## 2013-08-11 DIAGNOSIS — Z23 Encounter for immunization: Secondary | ICD-10-CM

## 2013-08-11 DIAGNOSIS — Z0000 Encounter for general adult medical examination without abnormal findings: Secondary | ICD-10-CM

## 2013-08-11 DIAGNOSIS — I10 Essential (primary) hypertension: Secondary | ICD-10-CM

## 2013-08-11 DIAGNOSIS — F603 Borderline personality disorder: Secondary | ICD-10-CM | POA: Diagnosis not present

## 2013-08-11 DIAGNOSIS — E1165 Type 2 diabetes mellitus with hyperglycemia: Principal | ICD-10-CM

## 2013-08-11 NOTE — Patient Instructions (Signed)
You had the new Prevnar pneumonia shot today  Please continue all other medications as before, and refills have been done if requested. Please have the pharmacy call with any other refills you may need.  You are given the lab test results today  Please return for any worsening low mood  Please stop smoking  Please return in 6 months, or sooner if needed, with Lab testing done 3-5 days before

## 2013-08-11 NOTE — Assessment & Plan Note (Signed)
stable overall by history and exam, recent data reviewed with pt, and pt to continue medical treatment as before,  to f/u any worsening symptoms or concerns BP Readings from Last 3 Encounters:  08/11/13 152/82  02/03/13 142/80  08/02/12 142/90

## 2013-08-11 NOTE — Progress Notes (Signed)
Pre-visit discussion using our clinic review tool. No additional management support is needed unless otherwise documented below in the visit note.  

## 2013-08-11 NOTE — Progress Notes (Signed)
   Subjective:    Patient ID: Vincent Campbell, male    DOB: Jan 26, 1941, 73 y.o.   MRN: [id number]  HPI Here to f/u; overall doing ok,  Pt denies chest pain, increased sob or doe, wheezing, orthopnea, PND, increased LE swelling, palpitations, dizziness or syncope.  Pt denies polydipsia, polyuria, or low sugar symptoms such as weakness or confusion improved with po intake.  Pt denies new neurological symptoms such as new headache, or facial or extremity weakness or numbness.   Pt states overall good compliance with meds, has been trying to follow lower cholesterol, diabetic diet, with wt overall stable at 215,  but little exercise however, plans to maybe do better.  Still smoking x 58 yrs, has cut back some but no plans to quit.  No acute complaints.  Has has low mood over the holidays and 2 friends died this past wk, but overall declines any tx, denies SI or HI.  BP at home has been < 140/90, declines med changes today.  Drinks "moderately" Past Medical History  Diagnosis Date  . BACK PAIN 01/12/2008  . COLONIC POLYPS, HX OF 01/12/2008  . Cough 05/28/2010  . HYPERLIPIDEMIA 01/12/2008  . HYPERTENSION 01/12/2008  . LEUKOCYTOSIS 03/02/2008  . PERIPHERAL EDEMA 06/28/2010  . SWELLING MASS OR LUMP IN HEAD AND NECK 05/28/2010  . Tremor 08/02/2012   Past Surgical History  Procedure Laterality Date  . Appendectomy    . Cholecystectomy    . Cataract extraction      reports that he has been smoking.  He does not have any smokeless tobacco history on file. He reports that he does not drink alcohol or use illicit drugs. family history includes Cancer in his other; Heart disease in his other; Hypertension in his other. No Known Allergies Vincent Campbell  Review of Systems  Constitutional: Negative for unexpected weight change, or unusual diaphoresis  HENT: Negative for tinnitus.   Eyes: Negative for photophobia and visual disturbance.  Respiratory: Negative for choking and stridor.   Gastrointestinal: Negative for  vomiting and blood in stool.  Genitourinary: Negative for hematuria and decreased urine volume.  Musculoskeletal: Negative for acute joint swelling Skin: Negative for color change and wound.  Neurological: Negative for tremors and numbness other than noted  Psychiatric/Behavioral: Negative for decreased concentration or  hyperactivity.       Objective:   Physical Exam BP 152/82  Pulse 65  Temp(Src) 97.8 F (36.6 C) (Oral)  Ht 5' 7.5" (1.715 m)  Wt 215 lb 4 oz (97.637 kg)  BMI 33.20 kg/m2  SpO2 96% VS noted,  Constitutional: Pt appears well-developed and well-nourished./obese  HENT: Head: NCAT.  Right Ear: External ear normal.  Left Ear: External ear normal.  Eyes: Conjunctivae and EOM are normal. Pupils are equal, round, and reactive to light.  Neck: Normal range of motion. Neck supple.  Cardiovascular: Normal rate and regular rhythm.   Pulmonary/Chest: Effort normal and breath sounds mild decr but no rales or wheezing.  Abd:  Soft, NT, non-distended, + BS Neurological: Pt is alert. Not confused , motor 5/5 Skin: Skin is warm. No erythema. No LE edema Psychiatric: Pt behavior is normal. Thought content normal. mild dysphoric     Assessment & Plan:

## 2013-08-11 NOTE — Assessment & Plan Note (Signed)
Yearly about the holidays with grief this yr as swell, declines further tx

## 2013-08-11 NOTE — Assessment & Plan Note (Signed)
Counseled to quit 

## 2013-08-11 NOTE — Assessment & Plan Note (Signed)
stable overall by history and exam, recent data reviewed with pt, and pt to continue medical treatment as before,  to f/u any worsening symptoms or concerns Lab Results  Component Value Date   LDLCALC 99 08/05/2013

## 2013-08-11 NOTE — Assessment & Plan Note (Signed)
stable overall by history and exam, recent data reviewed with pt, and pt to continue medical treatment as before,  to f/u any worsening symptoms or concerns Lab Results  Component Value Date   HGBA1C 6.8* 08/05/2013

## 2013-08-11 NOTE — Addendum Note (Signed)
Addended by: Scharlene GlossEWING, ROBIN B on: 08/11/2013 10:00 AM   Modules accepted: Orders

## 2013-08-12 ENCOUNTER — Telehealth: Payer: Self-pay

## 2013-08-12 ENCOUNTER — Telehealth: Payer: Self-pay | Admitting: Internal Medicine

## 2013-08-12 NOTE — Telephone Encounter (Signed)
Relevant patient education assigned to patient using Emmi. ° °

## 2013-11-28 ENCOUNTER — Emergency Department (HOSPITAL_COMMUNITY): Payer: 59

## 2013-11-28 ENCOUNTER — Telehealth: Payer: Self-pay | Admitting: Internal Medicine

## 2013-11-28 ENCOUNTER — Emergency Department (HOSPITAL_COMMUNITY)
Admission: EM | Admit: 2013-11-28 | Discharge: 2013-11-28 | Disposition: A | Discharge status: Home / Self Care | Payer: 59 | Attending: Emergency Medicine | Admitting: Emergency Medicine

## 2013-11-28 ENCOUNTER — Encounter (HOSPITAL_COMMUNITY): Payer: Self-pay | Admitting: Emergency Medicine

## 2013-11-28 DIAGNOSIS — Z860100 Personal history of colon polyps, unspecified: Secondary | ICD-10-CM | POA: Insufficient documentation

## 2013-11-28 DIAGNOSIS — Y939 Activity, unspecified: Secondary | ICD-10-CM | POA: Insufficient documentation

## 2013-11-28 DIAGNOSIS — I10 Essential (primary) hypertension: Secondary | ICD-10-CM | POA: Insufficient documentation

## 2013-11-28 DIAGNOSIS — F17200 Nicotine dependence, unspecified, uncomplicated: Secondary | ICD-10-CM | POA: Insufficient documentation

## 2013-11-28 DIAGNOSIS — S2249XA Multiple fractures of ribs, unspecified side, initial encounter for closed fracture: Secondary | ICD-10-CM | POA: Insufficient documentation

## 2013-11-28 DIAGNOSIS — J3489 Other specified disorders of nose and nasal sinuses: Secondary | ICD-10-CM | POA: Insufficient documentation

## 2013-11-28 DIAGNOSIS — J90 Pleural effusion, not elsewhere classified: Secondary | ICD-10-CM | POA: Diagnosis not present

## 2013-11-28 DIAGNOSIS — J942 Hemothorax: Secondary | ICD-10-CM

## 2013-11-28 DIAGNOSIS — Z79899 Other long term (current) drug therapy: Secondary | ICD-10-CM | POA: Insufficient documentation

## 2013-11-28 DIAGNOSIS — Y929 Unspecified place or not applicable: Secondary | ICD-10-CM | POA: Insufficient documentation

## 2013-11-28 DIAGNOSIS — Z8639 Personal history of other endocrine, nutritional and metabolic disease: Secondary | ICD-10-CM | POA: Diagnosis not present

## 2013-11-28 DIAGNOSIS — Z7982 Long term (current) use of aspirin: Secondary | ICD-10-CM | POA: Insufficient documentation

## 2013-11-28 DIAGNOSIS — Z862 Personal history of diseases of the blood and blood-forming organs and certain disorders involving the immune mechanism: Secondary | ICD-10-CM | POA: Insufficient documentation

## 2013-11-28 DIAGNOSIS — Z8739 Personal history of other diseases of the musculoskeletal system and connective tissue: Secondary | ICD-10-CM | POA: Diagnosis not present

## 2013-11-28 DIAGNOSIS — R296 Repeated falls: Secondary | ICD-10-CM | POA: Insufficient documentation

## 2013-11-28 LAB — PROTIME-INR
INR: 0.93 (ref 0.00–1.49)
Prothrombin Time: 12.3 seconds (ref 11.6–15.2)

## 2013-11-28 LAB — CBC WITH DIFFERENTIAL/PLATELET
Basophils Absolute: 0 10*3/uL (ref 0.0–0.1)
Basophils Relative: 0 % (ref 0–1)
Eosinophils Absolute: 0 10*3/uL (ref 0.0–0.7)
Eosinophils Relative: 0 % (ref 0–5)
HCT: 48 % (ref 39.0–52.0)
Hemoglobin: 16.4 g/dL (ref 13.0–17.0)
Lymphocytes Relative: 21 % (ref 12–46)
Lymphs Abs: 1.9 10*3/uL (ref 0.7–4.0)
MCH: 35 pg — ABNORMAL HIGH (ref 26.0–34.0)
MCHC: 34.2 g/dL (ref 30.0–36.0)
MCV: 102.3 fL — ABNORMAL HIGH (ref 78.0–100.0)
Monocytes Absolute: 1.6 10*3/uL — ABNORMAL HIGH (ref 0.1–1.0)
Monocytes Relative: 18 % — ABNORMAL HIGH (ref 3–12)
Neutro Abs: 5.4 10*3/uL (ref 1.7–7.7)
Neutrophils Relative %: 61 % (ref 43–77)
Platelets: 212 10*3/uL (ref 150–400)
RBC: 4.69 MIL/uL (ref 4.22–5.81)
RDW: 14.3 % (ref 11.5–15.5)
WBC: 8.9 10*3/uL (ref 4.0–10.5)

## 2013-11-28 LAB — COMPREHENSIVE METABOLIC PANEL
ALT: 35 U/L (ref 0–53)
AST: 46 U/L — ABNORMAL HIGH (ref 0–37)
Albumin: 3.6 g/dL (ref 3.5–5.2)
Alkaline Phosphatase: 58 U/L (ref 39–117)
BUN: 21 mg/dL (ref 6–23)
CO2: 30 mEq/L (ref 19–32)
Calcium: 8.6 mg/dL (ref 8.4–10.5)
Chloride: 91 mEq/L — ABNORMAL LOW (ref 96–112)
Creatinine, Ser: 1.23 mg/dL (ref 0.50–1.35)
GFR calc Af Amer: 66 mL/min — ABNORMAL LOW (ref >90)
GFR calc non Af Amer: 57 mL/min — ABNORMAL LOW (ref >90)
Glucose, Bld: 133 mg/dL — ABNORMAL HIGH (ref 70–99)
Potassium: 3.9 mEq/L (ref 3.7–5.3)
Sodium: 135 mEq/L — ABNORMAL LOW (ref 137–147)
Total Bilirubin: 0.5 mg/dL (ref 0.3–1.2)
Total Protein: 7.3 g/dL (ref 6.0–8.3)

## 2013-11-28 MED ORDER — OXYCODONE HCL 5 MG PO TABS: 5.0000 mg | ORAL_TABLET | Freq: Four times a day (QID) | ORAL | Status: DC | PRN

## 2013-11-28 MED ORDER — IOHEXOL 300 MG/ML  SOLN
100.0000 mL | Freq: Once | INTRAMUSCULAR | Status: AC | PRN
  Administered 2013-11-28: 100 mL via INTRAVENOUS

## 2013-11-28 NOTE — ED Provider Notes (Signed)
CSN: [id number]     Arrival date & time 11/28/13  [id number] History   First MD Initiated Contact with Patient 11/28/13 1137     Chief Complaint  Patient presents with  . Fall     (Consider location/radiation/quality/duration/timing/severity/associated sxs/prior Treatment) HPI  Past Medical History  Diagnosis Date  . BACK PAIN 01/12/2008  . COLONIC POLYPS, HX OF 01/12/2008  . Cough 05/28/2010  . HYPERLIPIDEMIA 01/12/2008  . HYPERTENSION 01/12/2008  . LEUKOCYTOSIS 03/02/2008  . PERIPHERAL EDEMA 06/28/2010  . SWELLING MASS OR LUMP IN HEAD AND NECK 05/28/2010  . Tremor 08/02/2012   Past Surgical History  Procedure Laterality Date  . Appendectomy    . Cholecystectomy    . Cataract extraction     Family History  Problem Relation Age of Onset  . Cancer Other     esophageal cancer  . Heart disease Other   . Hypertension Other    History  Substance Use Topics  . Smoking status: Current Every Day Smoker  . Smokeless tobacco: Not on file  . Alcohol Use: Yes    Review of Systems    Allergies  Review of patient's allergies indicates no known allergies.  Home Medications   Prior to Admission medications   Medication Sig Start Date End Date Taking? Authorizing Provider  aspirin 81 MG tablet Take 81 mg by mouth daily.     Yes Historical Provider, MD  Glucosamine 500 MG CAPS Take 500 mg by mouth daily.    Yes Historical Provider, MD  losartan-hydrochlorothiazide (HYZAAR) 100-12.5 MG per tablet Take 1 tablet by mouth daily.   Yes Historical Provider, MD  propranolol (INDERAL) 10 MG tablet Take 1 tablet (10 mg total) by mouth 2 (two) times daily. 02/03/13  Yes Corwin LevinsJames W John, MD   BP 105/71  Pulse 60  Temp(Src) 99.1 F (37.3 C) (Oral)  Resp 24  Ht 5' 7.5" (1.715 m)  Wt 210 lb (95.255 kg)  BMI 32.39 kg/m2  SpO2 94% Physical Exam  ED Course  Procedures (including critical care time) Labs Review Labs Reviewed  CBC WITH DIFFERENTIAL  PROTIME-INR  COMPREHENSIVE METABOLIC PANEL     Imaging Review Dg Ribs Unilateral W/chest Left  11/28/2013   CLINICAL DATA:  Fall with posterior left rib pain.  EXAM: LEFT RIBS AND CHEST - 3+ VIEW  COMPARISON:  DG CHEST 2 VIEW dated 05/28/2010  FINDINGS: Trachea is midline. Heart size stable. Mild left hilar prominence is unchanged. Mild scarring at the lung bases. Lungs are otherwise clear. No pleural fluid.  There are minimally displaced fractures of the left eighth, ninth and tenth posterior ribs.  IMPRESSION: 1. Minimally displaced fractures of the posterior left eighth, ninth and tenth ribs. 2. Mild bibasilar scarring.   Electronically Signed   By: Leanna BattlesMelinda  Blietz M.D.   On: 11/28/2013 11:48     EKG Interpretation None      MDM   Final diagnoses:  None   Patient here complaining of left-sided rib pain after falling this weekend. Chest x-ray shows 3 nondisplaced rib fractures. His exam shows tenderness at the left upper quadrant. Will obtain abdominal CT to rule out spleen injury.    Toy BakerAnthony T Allen, MD 11/28/13 [phone]

## 2013-11-28 NOTE — ED Provider Notes (Signed)
CSN: [id number]     Arrival date & time 11/28/13  [id number] History   First MD Initiated Contact with Patient 11/28/13 1137     Chief Complaint  Patient presents with  . Fall     (Consider location/radiation/quality/duration/timing/severity/associated sxs/prior Treatment) HPI  Patient to the ER with complaints of left rib pain and nasal congestion. He reports being a heavy drinker and falling onto a table on Saturday. His wife witnessed the fall and immediately helped him up. She denies that he hit his head or had loc. He says that this sort of thing happens often and that he is typically okay, however he has no recovered as quickly this time. He denies having SOB, difficulty breathing, abdominal pain, rectal bleeding, dizziness, headache, neck pain. He has not tried anything at home for the pain. He also reports nasal congestion and cough.   Past Medical History  Diagnosis Date  . BACK PAIN 01/12/2008  . COLONIC POLYPS, HX OF 01/12/2008  . Cough 05/28/2010  . HYPERLIPIDEMIA 01/12/2008  . HYPERTENSION 01/12/2008  . LEUKOCYTOSIS 03/02/2008  . PERIPHERAL EDEMA 06/28/2010  . SWELLING MASS OR LUMP IN HEAD AND NECK 05/28/2010  . Tremor 08/02/2012   Past Surgical History  Procedure Laterality Date  . Appendectomy    . Cholecystectomy    . Cataract extraction     Family History  Problem Relation Age of Onset  . Cancer Other     esophageal cancer  . Heart disease Other   . Hypertension Other    History  Substance Use Topics  . Smoking status: Current Every Day Smoker  . Smokeless tobacco: Not on file  . Alcohol Use: Yes    Review of Systems  Review of Systems  Gen: no weight loss, fevers, chills, night sweats  Eyes: no discharge or drainage, no occular pain or visual changes  Nose: no epistaxis or rhinorrhea  Mouth: no dental pain, no sore throat  Neck: no neck pain  Lungs:No wheezing, coughing or hemoptysis CV: no chest pain, palpitations, dependent edema or orthopnea, +chest wall  tenderness. Abd: no abdominal pain, nausea, vomiting, diarrhea GU: no dysuria or gross hematuria  MSK:  No muscle weakness or pain Neuro: no headache, no focal neurologic deficits  Skin: no rash or wounds Psyche: no complaints     Allergies  Review of patient's allergies indicates no known allergies.  Home Medications   Prior to Admission medications   Medication Sig Start Date End Date Taking? Authorizing Provider  aspirin 81 MG tablet Take 81 mg by mouth daily.     Yes Historical Provider, MD  Glucosamine 500 MG CAPS Take 500 mg by mouth daily.    Yes Historical Provider, MD  losartan-hydrochlorothiazide (HYZAAR) 100-12.5 MG per tablet Take 1 tablet by mouth daily.   Yes Historical Provider, MD  propranolol (INDERAL) 10 MG tablet Take 1 tablet (10 mg total) by mouth 2 (two) times daily. 02/03/13  Yes Corwin LevinsJames W John, MD   BP 143/88  Pulse 61  Temp(Src) 99.1 F (37.3 C) (Oral)  Resp 24  Ht 5' 7.5" (1.715 m)  Wt 210 lb (95.255 kg)  BMI 32.39 kg/m2  SpO2 92% Physical Exam  Nursing note and vitals reviewed. Constitutional: He is oriented to person, place, and time. He appears well-developed and well-nourished. No distress.  HENT:  Head: Normocephalic and atraumatic. Head is without contusion and without laceration.  Eyes: Pupils are equal, round, and reactive to light.  Neck: Normal range of motion. Neck supple.  No spinous process tenderness and no muscular tenderness present. Normal range of motion present.  No UE weakness  Cardiovascular: Normal rate and regular rhythm.   Pulmonary/Chest: Effort normal. He has no decreased breath sounds. He has no wheezes. He has rales in the right upper field, the right middle field and the right lower field. He exhibits tenderness. He exhibits no laceration, no crepitus, no edema and no swelling.    Abdominal: Soft. There is no tenderness (no tenderness to palpation of the abdomen).  Musculoskeletal:  No lower extremity swelling or bruises  noted to LE   Neurological: He is alert and oriented to person, place, and time. He has normal strength. No cranial nerve deficit or sensory deficit.  Skin: Skin is warm and dry.    ED Course  Procedures (including critical care time) Labs Review Labs Reviewed  CBC WITH DIFFERENTIAL - Abnormal; Notable for the following:    MCV 102.3 (*)    MCH 35.0 (*)    Monocytes Relative 18 (*)    Monocytes Absolute 1.6 (*)    All other components within normal limits  COMPREHENSIVE METABOLIC PANEL - Abnormal; Notable for the following:    Sodium 135 (*)    Chloride 91 (*)    Glucose, Bld 133 (*)    AST 46 (*)    GFR calc non Af Amer 57 (*)    GFR calc Af Amer 66 (*)    All other components within normal limits  PROTIME-INR    Imaging Review Dg Ribs Unilateral W/chest Left  11/28/2013   CLINICAL DATA:  Fall with posterior left rib pain.  EXAM: LEFT RIBS AND CHEST - 3+ VIEW  COMPARISON:  DG CHEST 2 VIEW dated 05/28/2010  FINDINGS: Trachea is midline. Heart size stable. Mild left hilar prominence is unchanged. Mild scarring at the lung bases. Lungs are otherwise clear. No pleural fluid.  There are minimally displaced fractures of the left eighth, ninth and tenth posterior ribs.  IMPRESSION: 1. Minimally displaced fractures of the posterior left eighth, ninth and tenth ribs. 2. Mild bibasilar scarring.   Electronically Signed   By: Leanna Battles M.D.   On: 11/28/2013 11:48   Ct Abdomen Pelvis W Contrast  11/28/2013   CLINICAL DATA:  Fall on Sunday. Left-sided back/ rib injury. Worsened pain now with congestion/cough. Rule out splenic injury.  EXAM: CT ABDOMEN AND PELVIS WITH CONTRAST  TECHNIQUE: Multidetector CT imaging of the abdomen and pelvis was performed using the standard protocol following bolus administration of intravenous contrast.  CONTRAST:  OMNIPAQUE IOHEXOL 300 MG/ML  SOLN  COMPARISON:  Plain films earlier today at ribs.  No prior CT.  FINDINGS: Lower Chest: Mild bibasilar  pulmonary interstitial thickening which is likely remote post infectious. Normal heart size with coronary artery of sclerosis. Calcified low mediastinal and right infrahilar nodes which are related to old granulomatous disease. Small hiatal hernia. Trace left hemothorax. No basilar pneumothorax.  Abdomen/Pelvis: Motion degradation within the lower chest and upper abdomen. Normal liver. Old granulomatous disease in the spleen. No splenic laceration or perisplenic hematoma.  Underdistended proximal stomach. Normal pancreas. Cholecystectomy without biliary ductal dilatation. Normal adrenal glands and right kidney. A low left renal interpolar lesion is too small to characterize but likely a cyst.  Advanced aortic atherosclerosis. Aneurysmal dilatation of bilateral common iliac arteries. 2.5 cm on the right and 2.1 cm on the left. No retroperitoneal or retrocrural adenopathy. Normal colon and terminal ileum. Normal small bowel without abdominal ascites. No  pneumatosis or free intraperitoneal air. Bilateral fat containing inguinal hernias.  Normal urinary bladder. Mild prostatomegaly, without significant free pelvic fluid.  Bones/Musculoskeletal: Displaced fractures of the posterior eighth through eleventh left ribs. Spondylosis with degenerative disc disease, most advanced at L5-S1  IMPRESSION: 1. Displaced left eighth through eleventh rib fractures with small volume overlying left hemothorax. 2. Although evaluation of the upper abdomen is motion degraded, no underlying splenic injury or subdiaphragmatic abnormality identified. 3. Bilateral common iliac artery aneurysms. 4.  Atherosclerosis, including within the coronary arteries.   Electronically Signed   By: Jeronimo GreavesKyle  Talbot M.D.   On: 11/28/2013 14:45     EKG Interpretation None      MDM   Final diagnoses:  Fracture of multiple ribs  Hemothorax on left   12:29 pm Evaluated patient and discussed with Dr. Freida BusmanAllen, he has seen patient recommends blood work, CMP,  CBC and PT.INR as well as CT abd w contrast to r/o spleen injury.   2:59 pm Patient given inspiratory spirometer in the ED. Given instructions on how to use it. His abdominal CT scan shows no intraabdominal acute injury. It further evaluates part of the rib fractures and shows a low volume hemothorax. Pt sating well. No pneumonia noted at lung basis despite rales to the right chest. Dr. Freida BusmanAllen does not feel that antibiotics in necessary at this time. Do not want pt to take ibuprofen or Tylenol, therefore will order a few lose dose oxycodones Given strict return to ED precautions, SOB, worsening pain, fevers, cough, weakness, confusion, pt needs  To be rechecked in the ED as soon as possible if these symptoms present.  73 y.o.Len BlalockJames W Nabers's evaluation in the Emergency Department is complete. It has been determined that no acute conditions requiring further emergency intervention are present at this time. The patient/guardian have been advised of the diagnosis and plan. We have discussed signs and symptoms that warrant return to the ED, such as changes or worsening in symptoms.  Vital signs are stable at discharge. Filed Vitals:   11/28/13 1345  BP: 143/88  Pulse: 61  Temp:   Resp:     Patient/guardian has voiced understanding and agreed to follow-up with the PCP or specialist.    Dorthula Matasiffany G Greene, PA-C 11/28/13 1516

## 2013-11-28 NOTE — Discharge Instructions (Signed)
Rib Fracture A rib fracture is a break or crack in one of the bones of the ribs. The ribs are a group of long, curved bones that wrap around your chest and attach to your spine. They protect your lungs and other organs in the chest cavity. A broken or cracked rib is often painful, but most do not cause other problems. Most rib fractures heal on their own over time. However, rib fractures can be more serious if multiple ribs are broken or if broken ribs move out of place and push against other structures. CAUSES   A direct blow to the chest. For example, this could happen during contact sports, a car accident, or a fall against a hard object.  Repetitive movements with high force, such as pitching a baseball or having severe coughing spells. SYMPTOMS   Pain when you breathe in or cough.  Pain when someone presses on the injured area. DIAGNOSIS  Your caregiver will perform a physical exam. Various imaging tests may be ordered to confirm the diagnosis and to look for related injuries. These tests may include a chest X-ray, computed tomography (CT), magnetic resonance imaging (MRI), or a bone scan. TREATMENT  Rib fractures usually heal on their own in 1 3 months. The longer healing period is often associated with a continued cough or other aggravating activities. During the healing period, pain control is very important. Medication is usually given to control pain. Hospitalization or surgery may be needed for more severe injuries, such as those in which multiple ribs are broken or the ribs have moved out of place.  HOME CARE INSTRUCTIONS   Avoid strenuous activity and any activities or movements that cause pain. Be careful during activities and avoid bumping the injured rib.  Gradually increase activity as directed by your caregiver.  Only take over-the-counter or prescription medications as directed by your caregiver. Do not take other medications without asking your caregiver first.  Apply ice  to the injured area for the first 1 2 days after you have been treated or as directed by your caregiver. Applying ice helps to reduce inflammation and pain.  Put ice in a plastic bag.  Place a towel between your skin and the bag.   Leave the ice on for 15 20 minutes at a time, every 2 hours while you are awake.  Perform deep breathing as directed by your caregiver. This will help prevent pneumonia, which is a common complication of a broken rib. Your caregiver may instruct you to:  Take deep breaths several times a day.  Try to cough several times a day, holding a pillow against the injured area.  Use a device called an incentive spirometer to practice deep breathing several times a day.  Drink enough fluids to keep your urine clear or pale yellow. This will help you avoid constipation.   Do not wear a rib belt or binder. These restrict breathing, which can lead to pneumonia.  SEEK IMMEDIATE MEDICAL CARE IF:   You have a fever.   You have difficulty breathing or shortness of breath.   You develop a continual cough, or you cough up thick or bloody sputum.  You feel sick to your stomach (nausea), throw up (vomit), or have abdominal pain.   You have worsening pain not controlled with medications.  MAKE SURE YOU:  Understand these instructions.  Will watch your condition.  Will get help right away if you are not doing well or get worse. Document Released: 07/14/2005 Document Revised:  03/16/2013 Document Reviewed: 09/15/2012 ExitCare Patient Information 2014 [place], [place].  Hemothorax A hemothorax is a collection of blood in the space between the chest wall and the lung. The medical term for this space is the pleural cavity. It is also called the pleural space. The most common cause for this condition is a chest injury. It can also happen from:  Diseases in the chest.  Blood clotting problems.  Taking blood thinning medicine.  Cancer of the chest.  Lung and  heart surgeries. Mild cases of hemothorax may clear without treatment in a couple weeks. More severe hemothorax may require surgical treatment. Because the blood compresses the lung and takes up space, some of the symptoms from this are:  Rapid, difficult breathing and shortness of breath.  Rapid heart rate, anxiety, and restlessness.  The blood pressure may be low and not high enough to support life. Sometimes with a hemothorax there is also pneumothorax. This means air in the chest. The air is not in the lung but is located outside the lung between the lung and the chest wall. When this happens it can cause added problems because the necessary lung space is taken up by something that is preventing the lungs from working. DIAGNOSIS  Your caregiver can usually tell what is wrong by examination and a chest X-ray. TREATMENT   If a hemothorax is mild, it may be watched to see if it will get better without treatment.  A more severe case may require having a tube put in to drain the pleural space of the lung. This is called a thoracostomy. It may also be called a chest tube or chest drain.  If bleeding continues, surgery may be required to go into the chest to stop the bleeding. Opening the chest is called a thoracotomy. Document Released: 04/09/2004 Document Revised: 10/06/2011 Document Reviewed: 05/03/2008 [facility] Patient Information 2014 [place], [place]. Incentive Spirometer An incentive spirometer is a tool that can help keep your lungs clear and active. This tool measures how well you are filling your lungs with each breath. Taking long deep breaths may help reverse or decrease the chance of developing breathing (pulmonary) problems (especially infection) following:  Surgery of the chest or abdomen.  Surgery if you have a history of smoking or a lung problem.  A long period of time when you are unable to move or be active. BEFORE THE PROCEDURE   If the spirometer includes an indictor  to show your best effort, your nurse or respiratory therapist will set it to a desired goal.  If possible, sit up straight or lean slightly forward. Try not to slouch.  Hold the incentive spirometer in an upright position. INSTRUCTIONS FOR USE  1. Sit on the edge of your bed if possible, or sit up as far as you can in bed or on a chair. 2. Hold the incentive spirometer in an upright position. 3. Breathe out normally. 4. Place the mouthpiece in your mouth and seal your lips tightly around it. 5. Breathe in slowly and as deeply as possible, raising the piston or the ball toward the top of the column. 6. Hold your breath for 3-5 seconds or for as long as possible. Allow the piston or ball to fall to the bottom of the column. 7. Remove the mouthpiece from your mouth and breathe out normally. 8. Rest for a few seconds and repeat Steps 1 through 7 at least 10 times every 1-2 hours when you are awake. Take your time and take  a few normal breaths between deep breaths. 9. The spirometer may include an indicator to show your best effort. Use the indicator as a goal to work toward during each repetition. 10. After each set of 10 deep breaths, practice coughing to be sure your lungs are clear. If you have an incision (the cut made at the time of surgery), support your incision when coughing by placing a pillow or rolled up towels firmly against it. Once you are able to get out of bed, walk around indoors and cough well. You may stop using the incentive spirometer when instructed by your caregiver.  RISKS AND COMPLICATIONS  Breathing too quickly may cause dizziness. At an extreme, this could cause you to pass out. Take your time so you do not get dizzy or light-headed.  If you are in pain, you may need to take or ask for pain medication before doing incentive spirometry. It is harder to take a deep breath if you are having pain. AFTER USE  Rest and breathe slowly and easily.  It can be helpful to keep  track of a log of your progress. Your caregiver can provide you with a simple table to help with this. If you are using the spirometer at home, follow these instructions: SEEK MEDICAL CARE IF:   You are having difficultly using the spirometer.  You have trouble using the spirometer as often as instructed.  Your pain medication is not giving enough relief while using the spirometer.  You develop fever of 100.5 F (38.1 C) or higher. SEEK IMMEDIATE MEDICAL CARE IF:   You cough up bloody sputum that had not been present before.  You develop fever of 102 F (38.9 C) or greater.  You develop worsening pain at or near the incision site. MAKE SURE YOU:   Understand these instructions.  Will watch your condition.  Will get help right away if you are not doing well or get worse. Document Released: 11/24/2006 Document Revised: 10/06/2011 Document Reviewed: 01/25/2007 [facility]Exit[facility] Patient Information 2014 [place], [place].

## 2013-11-28 NOTE — ED Notes (Signed)
Respiratory in room for incentive spirometer education.

## 2013-11-28 NOTE — ED Notes (Signed)
Patient reported to have a fall on Saturday.  He hit the left side of his back/rib on the table.  He has had increasing pain and now congestion/cough.  Patient denies any sob.  Patient was undressing when he fell.  Denies syncope

## 2013-11-28 NOTE — Telephone Encounter (Signed)
Patient Information:  Caller Name: Vincent Campbell  Phone: [phone]  Patient: Vincent Campbell, Vincent Campbell  Gender: Male  DOB: 12-10-1940  Age: 73 Years  PCP: Vincent Campbell, Vincent Campbell (Adults only)  Office Follow Up:  Does the office need to follow up with this patient?: No  Instructions For The Office: N/A  RN Note:  OFFICE HEAD'S UP- PT WENT TO ED FOR XRAY- Wife states that she prefers to go to ED since office has no xray capabilities.  [facility]; offered office appt but wife states that she would like to have an xray  Symptoms  Reason For Call & Symptoms: Wife is calling and states that pt fell on 11/26/13; he is now having pain on the left rib side; severe pain with coughing; can not lay on the left side;  denies diff breathing; no chest pain  Reviewed Health History In EMR: Yes  Reviewed Medications In EMR: Yes  Reviewed Allergies In EMR: Yes  Reviewed Surgeries / Procedures: Yes  Date of Onset of Symptoms: 11/26/2013  Treatments Tried: Motrin  Treatments Tried Worked: No  Guideline(s) Used:  Chest Injury  Disposition Per Guideline:   Go to ED Now (or to Office with PCP Approval)  Reason For Disposition Reached:   Can't take a deep breath but no respiratory distress (e.g., hurts to take a deep breath)  Advice Given:  N/A  Patient Refused Recommendation:  Patient Will Go To ED  Prefers xray

## 2013-12-01 NOTE — ED Provider Notes (Signed)
Medical screening examination/treatment/procedure(s) were conducted as a shared visit with non-physician practitioner(s) and myself.  I personally evaluated the patient during the encounter.   EKG Interpretation None       Toy BakerAnthony T Allen, MD 12/01/13 2321

## 2014-02-04 ENCOUNTER — Other Ambulatory Visit: Payer: Self-pay | Admitting: Internal Medicine

## 2014-02-07 ENCOUNTER — Other Ambulatory Visit (INDEPENDENT_AMBULATORY_CARE_PROVIDER_SITE_OTHER): Payer: 59

## 2014-02-07 DIAGNOSIS — Z0000 Encounter for general adult medical examination without abnormal findings: Secondary | ICD-10-CM | POA: Diagnosis not present

## 2014-02-07 DIAGNOSIS — R972 Elevated prostate specific antigen [PSA]: Secondary | ICD-10-CM

## 2014-02-07 DIAGNOSIS — IMO0001 Reserved for inherently not codable concepts without codable children: Secondary | ICD-10-CM

## 2014-02-07 DIAGNOSIS — E1165 Type 2 diabetes mellitus with hyperglycemia: Secondary | ICD-10-CM

## 2014-02-07 LAB — CBC WITH DIFFERENTIAL/PLATELET
Basophils Absolute: 0 10*3/uL (ref 0.0–0.1)
Basophils Relative: 0.4 % (ref 0.0–3.0)
Eosinophils Absolute: 0.3 10*3/uL (ref 0.0–0.7)
Eosinophils Relative: 2.8 % (ref 0.0–5.0)
HCT: 45.3 % (ref 39.0–52.0)
Hemoglobin: 15.2 g/dL (ref 13.0–17.0)
Lymphocytes Relative: 20.8 % (ref 12.0–46.0)
Lymphs Abs: 2.5 10*3/uL (ref 0.7–4.0)
MCHC: 33.5 g/dL (ref 30.0–36.0)
MCV: 100.8 fl — ABNORMAL HIGH (ref 78.0–100.0)
Monocytes Absolute: 1.9 10*3/uL — ABNORMAL HIGH (ref 0.1–1.0)
Monocytes Relative: 15.7 % — ABNORMAL HIGH (ref 3.0–12.0)
Neutro Abs: 7.3 10*3/uL (ref 1.4–7.7)
Neutrophils Relative %: 60.3 % (ref 43.0–77.0)
Platelets: 326 10*3/uL (ref 150.0–400.0)
RBC: 4.49 Mil/uL (ref 4.22–5.81)
RDW: 15.4 % (ref 11.5–15.5)
WBC: 12.1 10*3/uL — ABNORMAL HIGH (ref 4.0–10.5)

## 2014-02-07 LAB — URINALYSIS, ROUTINE W REFLEX MICROSCOPIC
Bilirubin Urine: NEGATIVE
Hgb urine dipstick: NEGATIVE
Ketones, ur: NEGATIVE
Leukocytes, UA: NEGATIVE
Nitrite: NEGATIVE
Specific Gravity, Urine: 1.01 (ref 1.000–1.030)
Total Protein, Urine: NEGATIVE
Urine Glucose: NEGATIVE
Urobilinogen, UA: 4 — AB (ref 0.0–1.0)
pH: 7 (ref 5.0–8.0)

## 2014-02-07 LAB — HEMOGLOBIN A1C: Hgb A1c MFr Bld: 6.5 % (ref 4.6–6.5)

## 2014-02-07 LAB — BASIC METABOLIC PANEL
BUN: 12 mg/dL (ref 6–23)
CO2: 32 mEq/L (ref 19–32)
Calcium: 9.2 mg/dL (ref 8.4–10.5)
Chloride: 98 mEq/L (ref 96–112)
Creatinine, Ser: 1 mg/dL (ref 0.4–1.5)
GFR: 80.64 mL/min (ref >60.00)
Glucose, Bld: 120 mg/dL — ABNORMAL HIGH (ref 70–99)
Potassium: 5.1 mEq/L (ref 3.5–5.1)
Sodium: 137 mEq/L (ref 135–145)

## 2014-02-07 LAB — HEPATIC FUNCTION PANEL
ALT: 15 U/L (ref 0–53)
AST: 19 U/L (ref 0–37)
Albumin: 3.5 g/dL (ref 3.5–5.2)
Alkaline Phosphatase: 89 U/L (ref 39–117)
Bilirubin, Direct: 0.3 mg/dL (ref 0.0–0.3)
Total Bilirubin: 1.4 mg/dL — ABNORMAL HIGH (ref 0.2–1.2)
Total Protein: 6.9 g/dL (ref 6.0–8.3)

## 2014-02-07 LAB — LIPID PANEL
Cholesterol: 172 mg/dL (ref 0–200)
HDL: 48.4 mg/dL (ref >39.00)
LDL Cholesterol: 106 mg/dL — ABNORMAL HIGH (ref 0–99)
NonHDL: 123.6
Total CHOL/HDL Ratio: 4
Triglycerides: 86 mg/dL (ref 0.0–149.0)
VLDL: 17.2 mg/dL (ref 0.0–40.0)

## 2014-02-07 LAB — MICROALBUMIN / CREATININE URINE RATIO
Creatinine,U: 92.9 mg/dL
Microalb Creat Ratio: 0.2 mg/g (ref 0.0–30.0)
Microalb, Ur: 0.2 mg/dL (ref 0.0–1.9)

## 2014-02-07 LAB — TSH: TSH: 4.81 u[IU]/mL — ABNORMAL HIGH (ref 0.35–4.50)

## 2014-02-07 LAB — PSA: PSA: 2.39 ng/mL (ref 0.10–4.00)

## 2014-02-09 ENCOUNTER — Ambulatory Visit: Payer: 59 | Admitting: Internal Medicine

## 2014-02-10 ENCOUNTER — Other Ambulatory Visit: Payer: Self-pay | Admitting: Internal Medicine

## 2014-02-13 ENCOUNTER — Other Ambulatory Visit: Payer: Self-pay | Admitting: Internal Medicine

## 2014-02-22 ENCOUNTER — Ambulatory Visit (INDEPENDENT_AMBULATORY_CARE_PROVIDER_SITE_OTHER): Payer: 59 | Admitting: Internal Medicine

## 2014-02-22 ENCOUNTER — Ambulatory Visit (INDEPENDENT_AMBULATORY_CARE_PROVIDER_SITE_OTHER)
Admission: RE | Admit: 2014-02-22 | Discharge: 2014-02-22 | Disposition: A | Discharge status: Home / Self Care | Payer: 59 | Source: Ambulatory Visit | Attending: Internal Medicine | Admitting: Internal Medicine

## 2014-02-22 VITALS — BP 112/78 | HR 87 | Temp 98.4°F | Wt 186.0 lb

## 2014-02-22 DIAGNOSIS — Z0000 Encounter for general adult medical examination without abnormal findings: Secondary | ICD-10-CM | POA: Diagnosis not present

## 2014-02-22 DIAGNOSIS — F17200 Nicotine dependence, unspecified, uncomplicated: Secondary | ICD-10-CM | POA: Diagnosis not present

## 2014-02-22 DIAGNOSIS — R634 Abnormal weight loss: Secondary | ICD-10-CM

## 2014-02-22 DIAGNOSIS — R972 Elevated prostate specific antigen [PSA]: Secondary | ICD-10-CM | POA: Diagnosis not present

## 2014-02-22 DIAGNOSIS — J309 Allergic rhinitis, unspecified: Secondary | ICD-10-CM

## 2014-02-22 DIAGNOSIS — IMO0001 Reserved for inherently not codable concepts without codable children: Secondary | ICD-10-CM | POA: Diagnosis not present

## 2014-02-22 DIAGNOSIS — E1165 Type 2 diabetes mellitus with hyperglycemia: Secondary | ICD-10-CM

## 2014-02-22 DIAGNOSIS — S2249XA Multiple fractures of ribs, unspecified side, initial encounter for closed fracture: Secondary | ICD-10-CM | POA: Diagnosis not present

## 2014-02-22 NOTE — Progress Notes (Signed)
Pre visit review using our clinic review tool, if applicable. No additional management support is needed unless otherwise documented below in the visit note. 

## 2014-02-22 NOTE — Progress Notes (Signed)
Subjective:    Patient ID: Vincent Campbell, male    DOB: 08/07/1940, 73 y.o.   MRN: [id number]  HPI  Here for wellness and f/u;  Overall doing ok;  Pt denies CP, worsening SOB, DOE, wheezing, orthopnea, PND, worsening LE edema, palpitations, dizziness or syncope.  Pt denies neurological change such as new headache, facial or extremity weakness.  Pt denies polydipsia, polyuria, or low sugar symptoms. Pt states overall good compliance with treatment and medications, good tolerability, and has been trying to follow lower cholesterol diet.  Pt denies worsening depressive symptoms, suicidal ideation or panic. No fever, night sweats, loss of appetite, or other constitutional symptoms, except wieght is down unintentionally from 215 to 186, thinks maybe due to better diet recently..  Pt states good ability with ADL's, has low fall risk, home safety reviewed and adequate, no other significant changes in hearing or vision, and only occasionally active with exercise.  Still smoking, no real plans to quit.  Does have several wks ongoing nasal allergy symptoms with clearish congestion, itch and sneezing, without fever, pain, ST, cough, swelling or wheezing.  Also with hearing loss bilat in the past wk.   May 2013 cxr, recent ct negative. Past Medical History  Diagnosis Date  . BACK PAIN 01/12/2008  . COLONIC POLYPS, HX OF 01/12/2008  . Cough 05/28/2010  . HYPERLIPIDEMIA 01/12/2008  . HYPERTENSION 01/12/2008  . LEUKOCYTOSIS 03/02/2008  . PERIPHERAL EDEMA 06/28/2010  . SWELLING MASS OR LUMP IN HEAD AND NECK 05/28/2010  . Tremor 08/02/2012   Past Surgical History  Procedure Laterality Date  . Appendectomy    . Cholecystectomy    . Cataract extraction      reports that he has been smoking.  He does not have any smokeless tobacco history on file. He reports that he drinks alcohol. He reports that he does not use illicit drugs. family history includes Cancer in his other; Heart disease in his other; Hypertension in his  other. No Known Allergies Current Outpatient Prescriptions on File Prior to Visit  Medication Sig Dispense Refill  . aspirin 81 MG tablet Take 81 mg by mouth daily.        . Glucosamine 500 MG CAPS Take 500 mg by mouth daily.       Marland Kitchen losartan-hydrochlorothiazide (HYZAAR) 100-12.5 MG per tablet Take 1 tablet by mouth daily.      . propranolol (INDERAL) 10 MG tablet TAKE ONE TABLET BY MOUTH TWICE DAILY  60 tablet  11  . propranolol (INDERAL) 10 MG tablet take 1 tablet by mouth once daily  90 tablet  3   No current facility-administered medications on file prior to visit.   Review of Systems Constitutional: Negative for increased diaphoresis, other activity, appetite or other siginficant weight change  HENT: Negative for worsening hearing loss, ear pain, facial swelling, mouth sores and neck stiffness.   Eyes: Negative for other worsening pain, redness or visual disturbance.  Respiratory: Negative for shortness of breath and wheezing.   Cardiovascular: Negative for chest pain and palpitations.  Gastrointestinal: Negative for diarrhea, blood in stool, abdominal distention or other pain Genitourinary: Negative for hematuria, flank pain or change in urine volume.  Musculoskeletal: Negative for myalgias or other joint complaints.  Skin: Negative for color change and wound.  Neurological: Negative for syncope and numbness. other than noted Hematological: Negative for adenopathy. or other swelling Psychiatric/Behavioral: Negative for hallucinations, self-injury, decreased concentration or other worsening agitation.      Objective:   Physical  Exam BP 112/78  Pulse 87  Temp(Src) 98.4 F (36.9 C) (Oral)  Wt 186 lb (84.369 kg)  SpO2 96% VS noted,  Constitutional: Pt is oriented to person, place, and time. Appears well-developed and well-nourished.  Head: Normocephalic and atraumatic.  Right Ear: External ear normal.  Left Ear: External ear normal.  Nose: Nose normal.  bilat canal wax  impactions resolved with irrigation Mouth/Throat: Oropharynx is clear and moist.  Bilat tm's with mild erythema.  Max sinus areas non tender.  Pharynx with mild erythema, no exudate Eyes: Conjunctivae and EOM are normal. Pupils are equal, round, and reactive to light.  Neck: Normal range of motion. Neck supple. No JVD present. No tracheal deviation present.  Cardiovascular: Normal rate, regular rhythm, normal heart sounds and intact distal pulses.   Pulmonary/Chest: Effort normal and breath sounds without rales or wheezing  Abdominal: Soft. Bowel sounds are normal. NT. No HSM  Musculoskeletal: Normal range of motion. Exhibits no edema.  Lymphadenopathy:  Has no cervical adenopathy.  Neurological: Pt is alert and oriented to person, place, and time. Pt has normal reflexes. No cranial nerve deficit. Motor grossly intact Skin: Skin is warm and dry. No rash noted.  Psychiatric:  Has normal mood and affect. Behavior is normal.         Assessment & Plan:

## 2014-02-22 NOTE — Assessment & Plan Note (Signed)
Urged to quit 

## 2014-02-22 NOTE — Patient Instructions (Addendum)
Your ears were irrigated of wax today  Please also start the OTC Claritin for the allergies  Please continue all other medications as before, and refills have been done if requested.  Please have the pharmacy call with any other refills you may need.  Please continue your efforts at being more active, low cholesterol diet, and weight control.  You are otherwise up to date with prevention measures today.  Please keep your appointments with your specialists as you may have planned  Please go to the XRAY Department in the Basement (go straight as you get off the elevator) for the x-ray testing  Please go to the LAB in the Basement (turn left off the elevator) for the tests to be done today for the "SPEP" which looks for abnormal proteins that could lead to weight loss  Please quit smoking  You will be contacted regarding the referral for: Urology due to the higher PSA  Please call if you change your mind about seeing Neurology about the tremors  Please return in 6 months, or sooner if needed

## 2014-02-24 DIAGNOSIS — R634 Abnormal weight loss: Secondary | ICD-10-CM | POA: Insufficient documentation

## 2014-02-24 NOTE — Assessment & Plan Note (Signed)
stable overall by history and exam, recent data reviewed with pt, and pt to continue medical treatment as before,  to f/u any worsening symptoms or concerns Lab Results  Component Value Date   HGBA1C 6.5 02/07/2014    

## 2014-02-24 NOTE — Assessment & Plan Note (Addendum)
For urology referral, Lab Results  Component Value Date   PSA 2.39 02/07/2014   PSA 0.70 01/31/2013   PSA 0.96 02/02/2012

## 2014-02-24 NOTE — Assessment & Plan Note (Signed)

## 2014-02-24 NOTE — Assessment & Plan Note (Signed)
Mild to mod, for claritin otc prn,  to f/u any worsening symptoms or concerns 

## 2014-02-24 NOTE — Assessment & Plan Note (Signed)
Unclear etiology, suspect reduced calorie intake though he's not sure of this.  Recent ct/cxr neg, declines further eval., exam o/w benign

## 2014-03-27 DIAGNOSIS — R972 Elevated prostate specific antigen [PSA]: Secondary | ICD-10-CM | POA: Diagnosis not present

## 2014-05-12 ENCOUNTER — Other Ambulatory Visit: Payer: Self-pay

## 2014-06-19 DIAGNOSIS — R972 Elevated prostate specific antigen [PSA]: Secondary | ICD-10-CM | POA: Diagnosis not present

## 2014-06-26 DIAGNOSIS — R972 Elevated prostate specific antigen [PSA]: Secondary | ICD-10-CM | POA: Diagnosis not present

## 2015-01-22 ENCOUNTER — Other Ambulatory Visit: Payer: Self-pay

## 2015-02-15 ENCOUNTER — Other Ambulatory Visit: Payer: Self-pay | Admitting: Internal Medicine

## 2015-02-17 ENCOUNTER — Other Ambulatory Visit: Payer: Self-pay | Admitting: Internal Medicine

## 2015-02-19 ENCOUNTER — Other Ambulatory Visit: Payer: Self-pay

## 2015-02-19 MED ORDER — LOSARTAN POTASSIUM-HCTZ 100-12.5 MG PO TABS: 1.0000 | ORAL_TABLET | Freq: Every day | ORAL | Status: DC

## 2015-02-20 ENCOUNTER — Other Ambulatory Visit: Payer: Self-pay | Admitting: Internal Medicine

## 2015-02-20 NOTE — Telephone Encounter (Signed)
Patient need refill of   losartan-hydrochlorothiazide (HYZAAR) 100-12.5 MG per tablet 90 tablet 0 02/19/2015     Sig - Route: Take 1 tablet by mouth daily. - Oral   E-Prescribing Status: Receipt confirmed by pharmacy (02/19/2015 12:41 PM EDT)     [facility] - [place], [place] - [address]

## 2015-04-04 ENCOUNTER — Other Ambulatory Visit: Payer: Self-pay | Admitting: Internal Medicine

## 2015-04-05 ENCOUNTER — Telehealth: Payer: Self-pay | Admitting: *Deleted

## 2015-04-05 MED ORDER — PROPRANOLOL HCL 10 MG PO TABS: 10.0000 mg | ORAL_TABLET | Freq: Every day | ORAL | Status: DC

## 2015-04-05 NOTE — Telephone Encounter (Signed)
Receive call from pt wife stating pharmacy told her md denied pt propanolol, and he is completely out. They are leaving going out of town for couple weeks and he is needing med. Inform wife per chart the reason why med was denied he is over due for CPX. MD last saw md 01/2014. Made pt appt for cpx, and sent a 30 day to [facility] until pt see md on 05/01/15...Raechel Chute

## 2015-05-01 ENCOUNTER — Encounter: Payer: Medicare Other | Admitting: Internal Medicine

## 2015-05-01 DIAGNOSIS — Z0289 Encounter for other administrative examinations: Secondary | ICD-10-CM

## 2015-05-04 ENCOUNTER — Telehealth: Payer: Self-pay | Admitting: Internal Medicine

## 2015-05-04 MED ORDER — PROPRANOLOL HCL 10 MG PO TABS: 10.0000 mg | ORAL_TABLET | Freq: Every day | ORAL | Status: DC

## 2015-05-04 NOTE — Telephone Encounter (Signed)
Patient missed his physical recently and I got him rescheduled.  He does need propranolol (INDERAL) 10 MG tablet [[id number] refilled  Pharmacy is [facility] on [address]

## 2015-05-05 ENCOUNTER — Other Ambulatory Visit: Payer: Self-pay | Admitting: Internal Medicine

## 2015-05-10 ENCOUNTER — Telehealth: Payer: Self-pay

## 2015-05-10 NOTE — Telephone Encounter (Signed)
Patient called to educate on Medicare Wellness apt. LVM for the patient to call back to educate and schedule for wellness visit. (CPE scheduled 11/2 at 10:30

## 2015-05-11 NOTE — Telephone Encounter (Signed)
2nd outreach to schedule AWV and lvm for call back/ will try to schedule separate from the day of his apt; 11/2 at 10: 30 with Dr. Jonny RuizJohn

## 2015-05-18 ENCOUNTER — Telehealth: Payer: Self-pay

## 2015-05-18 NOTE — Telephone Encounter (Signed)
Patient called to educate on Medicare Wellness apt. LVM for the patient to call back to educate and schedule for wellness visit.  Apt 11/2 and calling to inquire if he can come in prior to apt for AWV.  Called wife and referred to work number; called work number and there was no answer.

## 2015-05-21 NOTE — Telephone Encounter (Signed)
Tried work number one more time and no aswer. Will not attempt another outreach since wife took information

## 2015-05-30 ENCOUNTER — Encounter: Payer: Self-pay | Admitting: Internal Medicine

## 2015-05-30 ENCOUNTER — Ambulatory Visit (INDEPENDENT_AMBULATORY_CARE_PROVIDER_SITE_OTHER): Payer: PPO | Admitting: Internal Medicine

## 2015-05-30 VITALS — BP 132/82 | HR 65 | Temp 97.8°F | Ht 68.0 in | Wt 212.0 lb

## 2015-05-30 DIAGNOSIS — R972 Elevated prostate specific antigen [PSA]: Secondary | ICD-10-CM

## 2015-05-30 DIAGNOSIS — Z23 Encounter for immunization: Secondary | ICD-10-CM

## 2015-05-30 DIAGNOSIS — E119 Type 2 diabetes mellitus without complications: Secondary | ICD-10-CM

## 2015-05-30 DIAGNOSIS — E785 Hyperlipidemia, unspecified: Secondary | ICD-10-CM

## 2015-05-30 DIAGNOSIS — I10 Essential (primary) hypertension: Secondary | ICD-10-CM | POA: Diagnosis not present

## 2015-05-30 MED ORDER — PROPRANOLOL HCL 10 MG PO TABS: 10.0000 mg | ORAL_TABLET | Freq: Every day | ORAL | Status: DC

## 2015-05-30 MED ORDER — LOSARTAN POTASSIUM-HCTZ 100-12.5 MG PO TABS: 1.0000 | ORAL_TABLET | Freq: Every day | ORAL | Status: DC

## 2015-05-30 NOTE — Patient Instructions (Addendum)

## 2015-05-30 NOTE — Progress Notes (Signed)
Subjective:    Patient ID: Ula LingoJames W Fann, male    DOB: 1941/01/29, 74 y.o.   MRN: [id number]  HPI  Here for yearly f/u;  Overall doing ok;  Pt denies Chest pain, worsening SOB, DOE, wheezing, orthopnea, PND, worsening LE edema, palpitations, dizziness or syncope.  Pt denies neurological change such as new headache, facial or extremity weakness.  Pt denies polydipsia, polyuria, or low sugar symptoms. Pt states overall good compliance with treatment and medications, good tolerability, and has been trying to follow appropriate diet.  Pt denies worsening depressive symptoms, suicidal ideation or panic. No fever, night sweats, wt loss, loss of appetite, or other constitutional symptoms.  Pt states good ability with ADL's, has low fall risk, home safety reviewed and adequate, no other significant changes in hearing or vision, and only occasionally active with exercise, no further falls after last yr fall with left rib fx.  Drinks 1-2 drinks per night. Still with signficant tremor little improved with meds but does not want changes at this time Past Medical History  Diagnosis Date  . BACK PAIN 01/12/2008  . COLONIC POLYPS, HX OF 01/12/2008  . Cough 05/28/2010  . HYPERLIPIDEMIA 01/12/2008  . HYPERTENSION 01/12/2008  . LEUKOCYTOSIS 03/02/2008  . PERIPHERAL EDEMA 06/28/2010  . SWELLING MASS OR LUMP IN HEAD AND NECK 05/28/2010  . Tremor 08/02/2012   Past Surgical History  Procedure Laterality Date  . Appendectomy    . Cholecystectomy    . Cataract extraction      reports that he has been smoking.  He does not have any smokeless tobacco history on file. He reports that he drinks alcohol. He reports that he does not use illicit drugs. family history includes Cancer in his other; Heart disease in his other; Hypertension in his other. No Known Allergies Current Outpatient Prescriptions on File Prior to Visit  Medication Sig Dispense Refill  . aspirin 81 MG tablet Take 81 mg by mouth daily.      . Glucosamine  500 MG CAPS Take 500 mg by mouth daily.      No current facility-administered medications on file prior to visit.   Review of Systems Constitutional: Negative for increased diaphoresis, other activity, appetite or siginficant weight change other than noted HENT: Negative for worsening hearing loss, ear pain, facial swelling, mouth sores and neck stiffness.   Eyes: Negative for other worsening pain, redness or visual disturbance.  Respiratory: Negative for shortness of breath and wheezing  Cardiovascular: Negative for chest pain and palpitations.  Gastrointestinal: Negative for diarrhea, blood in stool, abdominal distention or other pain Genitourinary: Negative for hematuria, flank pain or change in urine volume.  Musculoskeletal: Negative for myalgias or other joint complaints.  Skin: Negative for color change and wound or drainage.  Neurological: Negative for syncope and numbness. other than noted Hematological: Negative for adenopathy. or other swelling Psychiatric/Behavioral: Negative for hallucinations, SI, self-injury, decreased concentration or other worsening agitation.      Objective:   Physical Exam BP 132/82 mmHg  Pulse 65  Temp(Src) 97.8 F (36.6 C) (Oral)  Ht 5\' 8"  (1.727 m)  Wt 212 lb (96.163 kg)  BMI 32.24 kg/m2  SpO2 95% VS noted,  Constitutional: Pt is oriented to person, place, and time. Appears well-developed and well-nourished, in no significant distress Head: Normocephalic and atraumatic.  Right Ear: External ear normal.  Left Ear: External ear normal.  Nose: Nose normal.  Mouth/Throat: Oropharynx is clear and moist.  Eyes: Conjunctivae and EOM are normal. Pupils  are equal, round, and reactive to light.  Neck: Normal range of motion. Neck supple. No JVD present. No tracheal deviation present or significant neck LA or mass Cardiovascular: Normal rate, regular rhythm, normal heart sounds and intact distal pulses.   Pulmonary/Chest: Effort normal and breath  sounds without rales or wheezing  Abdominal: Soft. Bowel sounds are normal. NT. No HSM  Musculoskeletal: Normal range of motion. Exhibits no edema.  Lymphadenopathy:  Has no cervical adenopathy.  Neurological: Pt is alert and oriented to person, place, and time. Pt has normal reflexes. No cranial nerve deficit. Motor grossly intact Skin: Skin is warm and dry. No rash noted.  Psychiatric:  Has mild dysphoric mood and affect. Behavior is normal.      Assessment & Plan:

## 2015-05-30 NOTE — Progress Notes (Signed)
Pre visit review using our clinic review tool, if applicable. No additional management support is needed unless otherwise documented below in the visit note. 

## 2015-05-31 ENCOUNTER — Other Ambulatory Visit (INDEPENDENT_AMBULATORY_CARE_PROVIDER_SITE_OTHER): Payer: PPO

## 2015-05-31 DIAGNOSIS — E119 Type 2 diabetes mellitus without complications: Secondary | ICD-10-CM | POA: Diagnosis not present

## 2015-05-31 DIAGNOSIS — R972 Elevated prostate specific antigen [PSA]: Secondary | ICD-10-CM

## 2015-05-31 LAB — HEPATIC FUNCTION PANEL
ALT: 22 U/L (ref 0–53)
AST: 18 U/L (ref 0–37)
Albumin: 4 g/dL (ref 3.5–5.2)
Alkaline Phosphatase: 69 U/L (ref 39–117)
Bilirubin, Direct: 0.2 mg/dL (ref 0.0–0.3)
Total Bilirubin: 1.2 mg/dL (ref 0.2–1.2)
Total Protein: 7.1 g/dL (ref 6.0–8.3)

## 2015-05-31 LAB — URINALYSIS, ROUTINE W REFLEX MICROSCOPIC
Bilirubin Urine: NEGATIVE
Hgb urine dipstick: NEGATIVE
Ketones, ur: NEGATIVE
Leukocytes, UA: NEGATIVE
Nitrite: NEGATIVE
Specific Gravity, Urine: 1.02 (ref 1.000–1.030)
Total Protein, Urine: NEGATIVE
Urine Glucose: NEGATIVE
Urobilinogen, UA: 1 (ref 0.0–1.0)
pH: 6 (ref 5.0–8.0)

## 2015-05-31 LAB — LIPID PANEL
Cholesterol: 193 mg/dL (ref 0–200)
HDL: 59 mg/dL (ref >39.00)
LDL Cholesterol: 114 mg/dL — ABNORMAL HIGH (ref 0–99)
NonHDL: 134.1
Total CHOL/HDL Ratio: 3
Triglycerides: 100 mg/dL (ref 0.0–149.0)
VLDL: 20 mg/dL (ref 0.0–40.0)

## 2015-05-31 LAB — BASIC METABOLIC PANEL
BUN: 19 mg/dL (ref 6–23)
CO2: 34 mEq/L — ABNORMAL HIGH (ref 19–32)
Calcium: 9.8 mg/dL (ref 8.4–10.5)
Chloride: 97 mEq/L (ref 96–112)
Creatinine, Ser: 1.09 mg/dL (ref 0.40–1.50)
GFR: 70.23 mL/min (ref >60.00)
Glucose, Bld: 137 mg/dL — ABNORMAL HIGH (ref 70–99)
Potassium: 4.6 mEq/L (ref 3.5–5.1)
Sodium: 137 mEq/L (ref 135–145)

## 2015-05-31 LAB — CBC WITH DIFFERENTIAL/PLATELET
Basophils Absolute: 0.3 10*3/uL — ABNORMAL HIGH (ref 0.0–0.1)
Basophils Relative: 2.4 % (ref 0.0–3.0)
Eosinophils Absolute: 0.2 10*3/uL (ref 0.0–0.7)
Eosinophils Relative: 2 % (ref 0.0–5.0)
HCT: 49 % (ref 39.0–52.0)
Hemoglobin: 16.3 g/dL (ref 13.0–17.0)
Lymphocytes Relative: 19.4 % (ref 12.0–46.0)
Lymphs Abs: 2.3 10*3/uL (ref 0.7–4.0)
MCHC: 33.3 g/dL (ref 30.0–36.0)
MCV: 100.5 fl — ABNORMAL HIGH (ref 78.0–100.0)
Monocytes Absolute: 1.5 10*3/uL — ABNORMAL HIGH (ref 0.1–1.0)
Monocytes Relative: 12.3 % — ABNORMAL HIGH (ref 3.0–12.0)
Neutro Abs: 7.6 10*3/uL (ref 1.4–7.7)
Neutrophils Relative %: 63.9 % (ref 43.0–77.0)
Platelets: 302 10*3/uL (ref 150.0–400.0)
RBC: 4.87 Mil/uL (ref 4.22–5.81)
RDW: 15.2 % (ref 11.5–15.5)
WBC: 11.9 10*3/uL — ABNORMAL HIGH (ref 4.0–10.5)

## 2015-05-31 LAB — MICROALBUMIN / CREATININE URINE RATIO
Creatinine,U: 156.7 mg/dL
Microalb Creat Ratio: 0.4 mg/g (ref 0.0–30.0)
Microalb, Ur: <0.7 mg/dL (ref 0.0–1.9)

## 2015-05-31 LAB — TSH: TSH: 3.54 u[IU]/mL (ref 0.35–4.50)

## 2015-05-31 LAB — HEMOGLOBIN A1C: Hgb A1c MFr Bld: 6.8 % — ABNORMAL HIGH (ref 4.6–6.5)

## 2015-05-31 LAB — PSA: PSA: 1.13 ng/mL (ref 0.10–4.00)

## 2015-06-02 NOTE — Assessment & Plan Note (Signed)
Asympt, for f/u psa 

## 2015-06-02 NOTE — Assessment & Plan Note (Signed)
stable overall by history and exam, recent data reviewed with pt, and pt to continue medical treatment as before,  to f/u any worsening symptoms or concerns Lab Results  Component Value Date   HGBA1C 6.8* 05/31/2015   For fu labs

## 2015-06-02 NOTE — Assessment & Plan Note (Signed)
stable overall by history and exam, recent data reviewed with pt, and pt to continue medical treatment as before,  to f/u any worsening symptoms or concerns Lab Results  Component Value Date   LDLCALC 114* 05/31/2015

## 2015-06-02 NOTE — Assessment & Plan Note (Signed)
stable overall by history and exam, recent data reviewed with pt, and pt to continue medical treatment as before,  to f/u any worsening symptoms or concerns BP Readings from Last 3 Encounters:  05/30/15 132/82  02/22/14 112/78  11/28/13 143/88

## 2015-06-08 ENCOUNTER — Other Ambulatory Visit: Payer: Self-pay | Admitting: Internal Medicine

## 2015-06-08 ENCOUNTER — Telehealth: Payer: Self-pay | Admitting: Internal Medicine

## 2015-06-08 MED ORDER — PROPRANOLOL HCL 10 MG PO TABS: 10.0000 mg | ORAL_TABLET | Freq: Every day | ORAL | Status: DC

## 2015-06-08 MED ORDER — LOSARTAN POTASSIUM-HCTZ 100-12.5 MG PO TABS: 1.0000 | ORAL_TABLET | Freq: Every day | ORAL | Status: DC

## 2015-06-08 NOTE — Telephone Encounter (Signed)
Pt request refill for propranolol (INDERAL) 10 MG tablet and losartan-hydrochlorothiazide (HYZAAR) 100-12.5 MG [facility] [address] [facility] if possible. Please help, they need new Rx send in.

## 2015-06-08 NOTE — Telephone Encounter (Signed)
Left msg on triage stating husband need refills on his Propanolol & Losartan. Called wife back inform her refills has already been [facility] this am.../lmb

## 2016-05-30 ENCOUNTER — Other Ambulatory Visit: Payer: Self-pay | Admitting: Internal Medicine

## 2016-06-10 ENCOUNTER — Telehealth: Payer: Self-pay | Admitting: *Deleted

## 2016-06-10 MED ORDER — LOSARTAN POTASSIUM-HCTZ 100-12.5 MG PO TABS: 1.0000 | ORAL_TABLET | Freq: Every day | ORAL | 0 refills | Status: DC

## 2016-06-10 NOTE — Telephone Encounter (Signed)
Left smg on triage stating pt has made appt for 11/30, but is needing refill on his Losartan until appt. [facility] to [facility]...Raechel Chute/lmb

## 2016-06-26 ENCOUNTER — Other Ambulatory Visit (INDEPENDENT_AMBULATORY_CARE_PROVIDER_SITE_OTHER): Payer: PPO

## 2016-06-26 ENCOUNTER — Encounter: Payer: Self-pay | Admitting: Internal Medicine

## 2016-06-26 ENCOUNTER — Ambulatory Visit (INDEPENDENT_AMBULATORY_CARE_PROVIDER_SITE_OTHER): Payer: PPO | Admitting: Internal Medicine

## 2016-06-26 VITALS — BP 130/78 | HR 63 | Temp 98.3°F | Resp 20 | Wt 213.0 lb

## 2016-06-26 DIAGNOSIS — Z0000 Encounter for general adult medical examination without abnormal findings: Secondary | ICD-10-CM

## 2016-06-26 DIAGNOSIS — M25561 Pain in right knee: Secondary | ICD-10-CM | POA: Diagnosis not present

## 2016-06-26 DIAGNOSIS — M25562 Pain in left knee: Secondary | ICD-10-CM

## 2016-06-26 DIAGNOSIS — G8929 Other chronic pain: Secondary | ICD-10-CM

## 2016-06-26 DIAGNOSIS — E119 Type 2 diabetes mellitus without complications: Secondary | ICD-10-CM

## 2016-06-26 DIAGNOSIS — Z0001 Encounter for general adult medical examination with abnormal findings: Secondary | ICD-10-CM

## 2016-06-26 DIAGNOSIS — I10 Essential (primary) hypertension: Secondary | ICD-10-CM

## 2016-06-26 DIAGNOSIS — H9193 Unspecified hearing loss, bilateral: Secondary | ICD-10-CM | POA: Diagnosis not present

## 2016-06-26 DIAGNOSIS — Z23 Encounter for immunization: Secondary | ICD-10-CM | POA: Diagnosis not present

## 2016-06-26 LAB — LIPID PANEL
Cholesterol: 208 mg/dL — ABNORMAL HIGH (ref 0–200)
HDL: 48.9 mg/dL (ref >39.00)
LDL Cholesterol: 136 mg/dL — ABNORMAL HIGH (ref 0–99)
NonHDL: 158.96
Total CHOL/HDL Ratio: 4
Triglycerides: 115 mg/dL (ref 0.0–149.0)
VLDL: 23 mg/dL (ref 0.0–40.0)

## 2016-06-26 LAB — CBC WITH DIFFERENTIAL/PLATELET
Basophils Absolute: 0.1 10*3/uL (ref 0.0–0.1)
Basophils Relative: 0.5 % (ref 0.0–3.0)
Eosinophils Absolute: 0.4 10*3/uL (ref 0.0–0.7)
Eosinophils Relative: 3.4 % (ref 0.0–5.0)
HCT: 47.3 % (ref 39.0–52.0)
Hemoglobin: 16.1 g/dL (ref 13.0–17.0)
Lymphocytes Relative: 25.4 % (ref 12.0–46.0)
Lymphs Abs: 3 10*3/uL (ref 0.7–4.0)
MCHC: 34 g/dL (ref 30.0–36.0)
MCV: 99.7 fl (ref 78.0–100.0)
Monocytes Absolute: 1.8 10*3/uL — ABNORMAL HIGH (ref 0.1–1.0)
Monocytes Relative: 14.7 % — ABNORMAL HIGH (ref 3.0–12.0)
Neutro Abs: 6.7 10*3/uL (ref 1.4–7.7)
Neutrophils Relative %: 56 % (ref 43.0–77.0)
Platelets: 339 10*3/uL (ref 150.0–400.0)
RBC: 4.75 Mil/uL (ref 4.22–5.81)
RDW: 14.2 % (ref 11.5–15.5)
WBC: 12 10*3/uL — ABNORMAL HIGH (ref 4.0–10.5)

## 2016-06-26 LAB — URINALYSIS, ROUTINE W REFLEX MICROSCOPIC
Bilirubin Urine: NEGATIVE
Hgb urine dipstick: NEGATIVE
Ketones, ur: NEGATIVE
Leukocytes, UA: NEGATIVE
Nitrite: NEGATIVE
RBC / HPF: NONE SEEN (ref 0–?)
Specific Gravity, Urine: 1.01 (ref 1.000–1.030)
Total Protein, Urine: NEGATIVE
Urine Glucose: NEGATIVE
Urobilinogen, UA: 0.2 (ref 0.0–1.0)
pH: 5.5 (ref 5.0–8.0)

## 2016-06-26 LAB — MICROALBUMIN / CREATININE URINE RATIO
Creatinine,U: 79.7 mg/dL
Microalb Creat Ratio: 0.9 mg/g (ref 0.0–30.0)
Microalb, Ur: <0.7 mg/dL (ref 0.0–1.9)

## 2016-06-26 LAB — HEPATIC FUNCTION PANEL
ALT: 39 U/L (ref 0–53)
AST: 29 U/L (ref 0–37)
Albumin: 4.1 g/dL (ref 3.5–5.2)
Alkaline Phosphatase: 65 U/L (ref 39–117)
Bilirubin, Direct: 0.2 mg/dL (ref 0.0–0.3)
Total Bilirubin: 0.8 mg/dL (ref 0.2–1.2)
Total Protein: 7.1 g/dL (ref 6.0–8.3)

## 2016-06-26 LAB — BASIC METABOLIC PANEL
BUN: 11 mg/dL (ref 6–23)
CO2: 32 mEq/L (ref 19–32)
Calcium: 9.7 mg/dL (ref 8.4–10.5)
Chloride: 99 mEq/L (ref 96–112)
Creatinine, Ser: 1.01 mg/dL (ref 0.40–1.50)
GFR: 76.47 mL/min (ref >60.00)
Glucose, Bld: 115 mg/dL — ABNORMAL HIGH (ref 70–99)
Potassium: 4.5 mEq/L (ref 3.5–5.1)
Sodium: 139 mEq/L (ref 135–145)

## 2016-06-26 LAB — PSA: PSA: 1.7 ng/mL (ref 0.10–4.00)

## 2016-06-26 LAB — HEMOGLOBIN A1C: Hgb A1c MFr Bld: 7 % — ABNORMAL HIGH (ref 4.6–6.5)

## 2016-06-26 LAB — TSH: TSH: 4.89 u[IU]/mL — ABNORMAL HIGH (ref 0.35–4.50)

## 2016-06-26 NOTE — Patient Instructions (Addendum)

## 2016-06-26 NOTE — Progress Notes (Signed)
Pre visit review using our clinic review tool, if applicable. No additional management support is needed unless otherwise documented below in the visit note. 

## 2016-06-26 NOTE — Progress Notes (Signed)
Subjective:    Patient ID: Ula LingoJames W Cervi, male    DOB: [birth year], 75 y.o.   MRN: [id number]  HPI  Here for wellness and f/u;  Overall doing ok;  Pt denies Chest pain, worsening SOB, DOE, wheezing, orthopnea, PND, worsening LE edema, palpitations, dizziness or syncope.  Pt denies neurological change such as new headache, facial or extremity weakness.  Pt denies polydipsia, polyuria, or low sugar symptoms. Pt states overall good compliance with treatment and medications, good tolerability, and has been trying to follow appropriate diet.  Pt denies worsening depressive symptoms, suicidal ideation or panic. No fever, night sweats, wt loss, loss of appetite, or other constitutional symptoms.  Pt states good ability with ADL's, has low fall risk, home safety reviewed and adequate, no other significant changes in hearing or vision, and only occasionally active with exercise,   This is due to persistent months of bilat knee pain worsening to climb stairs, mild to mod without swelling, giveaways or falls. Does have some bilat hearing loss, acute wax related ?, on chronic mild loss. Wt Readings from Last 3 Encounters:  06/26/16 213 lb (96.6 kg)  05/30/15 212 lb (96.2 kg)  02/22/14 186 lb (84.4 kg)  Has optho appt next month, last visit about 2 yrs. Also has cut "way back" and acutally quit smoking for 2 mo, then started drinking then went back to cigarrettes, And "my wife has near damned turned me into a vegan." Past Medical History:  Diagnosis Date  . BACK PAIN 01/12/2008  . COLONIC POLYPS, HX OF 01/12/2008  . Cough 05/28/2010  . HYPERLIPIDEMIA 01/12/2008  . HYPERTENSION 01/12/2008  . LEUKOCYTOSIS 03/02/2008  . PERIPHERAL EDEMA 06/28/2010  . SWELLING MASS OR LUMP IN HEAD AND NECK 05/28/2010  . Tremor 08/02/2012   Past Surgical History:  Procedure Laterality Date  . APPENDECTOMY    . CATARACT EXTRACTION    . CHOLECYSTECTOMY      reports that he has been smoking.  He does not have any smokeless tobacco  history on file. He reports that he drinks alcohol. He reports that he does not use drugs. family history includes Cancer in his other; Heart disease in his other; Hypertension in his other. No Known Allergies Current Outpatient Prescriptions on File Prior to Visit  Medication Sig Dispense Refill  . aspirin 81 MG tablet Take 81 mg by mouth daily.      . Glucosamine 500 MG CAPS Take 500 mg by mouth daily.     Marland Kitchen. losartan-hydrochlorothiazide (HYZAAR) 100-12.5 MG tablet Take 1 tablet by mouth daily. 90 tablet 3  . losartan-hydrochlorothiazide (HYZAAR) 100-12.5 MG tablet Take 1 tablet by mouth daily. Must keep 06/26/16 appt for future refills 30 tablet 0  . propranolol (INDERAL) 10 MG tablet TAKE ONE TABLET BY MOUTH ONCE DAILY 90 tablet 3   No current facility-administered medications on file prior to visit.     Review of Systems Constitutional: Negative for increased diaphoresis, or other activity, appetite or siginficant weight change other than noted HENT: Negative for worsening hearing loss, ear pain, facial swelling, mouth sores and neck stiffness.   Eyes: Negative for other worsening pain, redness or visual disturbance.  Respiratory: Negative for choking or stridor Cardiovascular: Negative for other chest pain and palpitations.  Gastrointestinal: Negative for worsening diarrhea, blood in stool, or abdominal distention Genitourinary: Negative for hematuria, flank pain or change in urine volume.  Musculoskeletal: Negative for myalgias or other joint complaints.  Skin: Negative for other color change and wound  or drainage.  Neurological: Negative for syncope and numbness. other than noted Hematological: Negative for adenopathy. or other swelling Psychiatric/Behavioral: Negative for hallucinations, SI, self-injury, decreased concentration or other worsening agitation.  All other system neg per pt    Objective:   Physical Exam BP 130/78   Pulse 63   Temp 98.3 F (36.8 C) (Oral)   Resp  20   Wt 213 lb (96.6 kg)   SpO2 97%   BMI 32.39 kg/m  VS noted,  Constitutional: Pt is oriented to person, place, and time. Appears well-developed and well-nourished, in no significant distress Head: Normocephalic and atraumatic  Eyes: Conjunctivae and EOM are normal. Pupils are equal, round, and reactive to light Right Ear: External ear normal.  Left Ear: External ear normal Nose: Nose normal.  Bilat canals cleared of wax impactions after irrigation, hearing improved Mouth/Throat: Oropharynx is clear and moist  Neck: Normal range of motion. Neck supple. No JVD present. No tracheal deviation present or significant neck LA or mass Cardiovascular: Normal rate, regular rhythm, normal heart sounds and intact distal pulses.   Pulmonary/Chest: Effort normal and breath sounds without rales or wheezing  Abdominal: Soft. Bowel sounds are normal. NT. No HSM  Musculoskeletal: Normal range of motion. Exhibits trace bilat pedal edema Lymphadenopathy: Has no cervical adenopathy.  Neurological: Pt is alert and oriented to person, place, and time. Pt has normal reflexes. No cranial nerve deficit. Motor grossly intact Skin: Skin is warm and dry. No rash noted or new ulcers Psychiatric:  Has normal mood and affect. Behavior is normal.  Bilat knees with crepitus, reduced ROM, NT, no effusions noted No other new exam changes    Assessment & Plan:

## 2016-06-28 DIAGNOSIS — M25562 Pain in left knee: Secondary | ICD-10-CM

## 2016-06-28 DIAGNOSIS — H9193 Unspecified hearing loss, bilateral: Secondary | ICD-10-CM | POA: Insufficient documentation

## 2016-06-28 DIAGNOSIS — M25561 Pain in right knee: Secondary | ICD-10-CM | POA: Insufficient documentation

## 2016-06-28 NOTE — Assessment & Plan Note (Addendum)
Chronic persistent, for pain control, consider refer sport medicine

## 2016-06-28 NOTE — Assessment & Plan Note (Signed)
stable overall by history and exam, recent data reviewed with pt, and pt to continue medical treatment as before,  to f/u any worsening symptoms or concerns Lab Results  Component Value Date   HGBA1C 7.0 (H) 06/26/2016

## 2016-06-28 NOTE — Assessment & Plan Note (Addendum)
Improved after bilat wax impactions removed,  to f/u any worsening symptoms or concerns  In addition to the time spent performing CPE, I spent an additional 15 minutes face to face,in which greater than 50% of this time was spent in counseling and coordination of care for patient's illness as documented.

## 2016-06-28 NOTE — Assessment & Plan Note (Signed)

## 2016-06-28 NOTE — Assessment & Plan Note (Signed)
stable overall by history and exam, recent data reviewed with pt, and pt to continue medical treatment as before,  to f/u any worsening symptoms or concerns BP Readings from Last 3 Encounters:  06/26/16 130/78  05/30/15 132/82  02/22/14 112/78

## 2016-07-10 ENCOUNTER — Other Ambulatory Visit: Payer: Self-pay | Admitting: Internal Medicine

## 2016-08-09 ENCOUNTER — Other Ambulatory Visit: Payer: Self-pay | Admitting: Internal Medicine

## 2016-11-27 ENCOUNTER — Telehealth: Payer: Self-pay | Admitting: Internal Medicine

## 2016-11-27 NOTE — Telephone Encounter (Signed)
Called patient to schedule awv. Lvm for patient to call office to schedule appt.  °

## 2016-12-18 DIAGNOSIS — H40013 Open angle with borderline findings, low risk, bilateral: Secondary | ICD-10-CM | POA: Diagnosis not present

## 2016-12-25 ENCOUNTER — Ambulatory Visit: Payer: PPO | Admitting: Internal Medicine

## 2017-01-22 DIAGNOSIS — H40013 Open angle with borderline findings, low risk, bilateral: Secondary | ICD-10-CM | POA: Diagnosis not present

## 2017-02-03 ENCOUNTER — Telehealth: Payer: Self-pay | Admitting: Internal Medicine

## 2017-02-03 MED ORDER — LOSARTAN POTASSIUM-HCTZ 100-12.5 MG PO TABS: 1.0000 | ORAL_TABLET | Freq: Every day | ORAL | 1 refills | Status: DC

## 2017-02-03 NOTE — Telephone Encounter (Signed)
Pt needs refill on Losartan   Pharmacy - [facility] [address]

## 2017-02-03 NOTE — Telephone Encounter (Signed)
Done

## 2017-04-08 ENCOUNTER — Telehealth: Payer: Self-pay | Admitting: Internal Medicine

## 2017-04-08 NOTE — Telephone Encounter (Signed)
Called pt to schedule AWV. Left vm for pt to call office to schedule appt. Pt has not completed AWV. Appt can be scheduled at anytime. SF

## 2017-05-21 ENCOUNTER — Other Ambulatory Visit: Payer: Self-pay | Admitting: Internal Medicine

## 2017-05-21 NOTE — Telephone Encounter (Signed)
Pt wife called regarding this

## 2017-05-21 NOTE — Telephone Encounter (Signed)
Sent 30 day script until annual appt in Nov.../lmb

## 2017-07-02 ENCOUNTER — Telehealth: Payer: Self-pay | Admitting: Internal Medicine

## 2017-07-02 DIAGNOSIS — Z0000 Encounter for general adult medical examination without abnormal findings: Secondary | ICD-10-CM

## 2017-07-02 MED ORDER — PROPRANOLOL HCL 10 MG PO TABS: 10.0000 mg | ORAL_TABLET | Freq: Every day | ORAL | 0 refills | Status: DC

## 2017-07-02 NOTE — Telephone Encounter (Signed)
Left message letting patient know °

## 2017-07-02 NOTE — Telephone Encounter (Signed)
Done

## 2017-07-02 NOTE — Telephone Encounter (Signed)
Pt came by the office to schedule his appointment that was missed a couple months ago. He is scheduled to see Dr Jonny RuizJohn on 07/13/17. he stated that he is completely out of his propranolol (INDERAL) 10 MG tablet prescription and wanted to know [facility] to get him through until his appointment.

## 2017-07-02 NOTE — Telephone Encounter (Signed)
Patient is scheduled to see Dr Jonny RuizJohn on 07/13/17 for a year follow up and would like to have his labs done prior to this visit. Can these be put in for him? He would like a call to let him know when they are ready.

## 2017-07-02 NOTE — Telephone Encounter (Signed)
Per office policy sent 30 day to local pharmacy until appt.../lmb  

## 2017-07-13 ENCOUNTER — Ambulatory Visit: Payer: PPO | Admitting: Internal Medicine

## 2017-07-13 ENCOUNTER — Encounter: Payer: Self-pay | Admitting: Internal Medicine

## 2017-07-13 VITALS — BP 112/78 | HR 74 | Temp 97.6°F | Ht 68.0 in | Wt 209.0 lb

## 2017-07-13 DIAGNOSIS — R251 Tremor, unspecified: Secondary | ICD-10-CM

## 2017-07-13 DIAGNOSIS — E785 Hyperlipidemia, unspecified: Secondary | ICD-10-CM

## 2017-07-13 DIAGNOSIS — I10 Essential (primary) hypertension: Secondary | ICD-10-CM | POA: Diagnosis not present

## 2017-07-13 DIAGNOSIS — Z23 Encounter for immunization: Secondary | ICD-10-CM | POA: Diagnosis not present

## 2017-07-13 DIAGNOSIS — E119 Type 2 diabetes mellitus without complications: Secondary | ICD-10-CM | POA: Diagnosis not present

## 2017-07-13 DIAGNOSIS — Z0001 Encounter for general adult medical examination with abnormal findings: Secondary | ICD-10-CM | POA: Insufficient documentation

## 2017-07-13 DIAGNOSIS — Z0000 Encounter for general adult medical examination without abnormal findings: Secondary | ICD-10-CM

## 2017-07-13 MED ORDER — PROPRANOLOL HCL 10 MG PO TABS: 10.0000 mg | ORAL_TABLET | Freq: Two times a day (BID) | ORAL | 11 refills | Status: DC

## 2017-07-13 NOTE — Assessment & Plan Note (Signed)
Ok to increase the propranolol to 10 bid

## 2017-07-13 NOTE — Progress Notes (Signed)
Subjective:    Patient ID: Vincent Campbell, male    DOB: 1941/02/12, 76 y.o.   MRN: [id number]  HPI  Here for wellness and f/u;  Overall doing ok;  Pt denies Chest pain, worsening SOB, DOE, wheezing, orthopnea, PND, worsening LE edema, palpitations, dizziness or syncope.  Pt denies neurological change such as new headache, facial or extremity weakness.  Pt denies polydipsia, polyuria, or low sugar symptoms. Pt states overall good compliance with treatment and medications, good tolerability, and has been trying to follow appropriate diet.  Pt denies worsening depressive symptoms, suicidal ideation or panic. No fever, night sweats, wt loss, loss of appetite, or other constitutional symptoms.  Pt states good ability with ADL's, has low fall risk, home safety reviewed and adequate, no other significant changes in hearing or vision, and not active with exercise.  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  Has had persistent tremors better and worse, thinks it may be related to sleeping on his left side at night Past Medical History:  Diagnosis Date  . BACK PAIN 01/12/2008  . COLONIC POLYPS, HX OF 01/12/2008  . Cough 05/28/2010  . Encounter for well adult exam with abnormal findings 12/29/2010  . HYPERLIPIDEMIA 01/12/2008  . HYPERTENSION 01/12/2008  . LEUKOCYTOSIS 03/02/2008  . PERIPHERAL EDEMA 06/28/2010  . SWELLING MASS OR LUMP IN HEAD AND NECK 05/28/2010  . Tremor 08/02/2012   Past Surgical History:  Procedure Laterality Date  . APPENDECTOMY    . CATARACT EXTRACTION    . CHOLECYSTECTOMY      reports that he has been smoking.  he has never used smokeless tobacco. He reports that he drinks alcohol. He reports that he does not use drugs. family history includes Cancer in his other; Heart disease in his other; Hypertension in his other. No Known Allergies Current Outpatient Medications on File Prior to Visit  Medication Sig Dispense Refill  . aspirin 81 MG tablet  Take 81 mg by mouth daily.      . Glucosamine 500 MG CAPS Take 500 mg by mouth daily.     Marland Kitchen. losartan-hydrochlorothiazide (HYZAAR) 100-12.5 MG tablet Take 1 tablet by mouth daily. 90 tablet 1   No current facility-administered medications on file prior to visit.    Review of Systems Constitutional: Negative for other unusual diaphoresis, sweats, appetite or weight changes HENT: Negative for other worsening hearing loss, ear pain, facial swelling, mouth sores or neck stiffness.   Eyes: Negative for other worsening pain, redness or other visual disturbance.  Respiratory: Negative for other stridor or swelling Cardiovascular: Negative for other palpitations or other chest pain  Gastrointestinal: Negative for worsening diarrhea or loose stools, blood in stool, distention or other pain Genitourinary: Negative for hematuria, flank pain or other change in urine volume.  Musculoskeletal: Negative for myalgias or other joint swelling.  Skin: Negative for other color change, or other wound or worsening drainage.  Neurological: Negative for other syncope or numbness. Hematological: Negative for other adenopathy or swelling Psychiatric/Behavioral: Negative for hallucinations, other worsening agitation, SI, self-injury, or new decreased concentration All other system neg per pt    Objective:   Physical Exam BP 112/78   Pulse 74   Temp 97.6 F (36.4 C) (Oral)   Ht 5\' 8"  (1.727 m)   Wt 209 lb (94.8 kg)   SpO2 95%   BMI 31.78 kg/m  VS noted,  Constitutional: Pt is oriented to person, place, and time. Appears well-developed  and well-nourished, in no significant distress and comfortable Head: Normocephalic and atraumatic  Eyes: Conjunctivae and EOM are normal. Pupils are equal, round, and reactive to light Right Ear: External ear normal without discharge Left Ear: External ear normal without discharge Nose: Nose without discharge or deformity Mouth/Throat: Oropharynx is without other ulcerations  and moist  Neck: Normal range of motion. Neck supple. No JVD present. No tracheal deviation present or significant neck LA or mass Cardiovascular: Normal rate, regular rhythm, normal heart sounds and intact distal pulses.   Pulmonary/Chest: WOB normal and breath sounds without rales or wheezing  Abdominal: Soft. Bowel sounds are normal. NT. No HSM  Musculoskeletal: Normal range of motion. Exhibits no edema Lymphadenopathy: Has no other cervical adenopathy.  Neurological: Pt is alert and oriented to person, place, and time. Pt has normal reflexes. No cranial nerve deficit. Motor grossly intact, Gait intact, + tremor bilat hands Skin: Skin is warm and dry. No rash noted or new ulcerations Psychiatric:  Has normal mood and affect. Behavior is normal without agitation No other exam findings Lab Results  Component Value Date   WBC 12.0 (H) 06/26/2016   HGB 16.1 06/26/2016   HCT 47.3 06/26/2016   PLT 339.0 06/26/2016   GLUCOSE 115 (H) 06/26/2016   CHOL 208 (H) 06/26/2016   TRIG 115.0 06/26/2016   HDL 48.90 06/26/2016   LDLDIRECT 116.7 05/28/2010   LDLCALC 136 (H) 06/26/2016   ALT 39 06/26/2016   AST 29 06/26/2016   NA 139 06/26/2016   K 4.5 06/26/2016   CL 99 06/26/2016   CREATININE 1.01 06/26/2016   BUN 11 06/26/2016   CO2 32 06/26/2016   TSH 4.89 (H) 06/26/2016   PSA 1.70 06/26/2016   INR 0.93 11/28/2013   HGBA1C 7.0 (H) 06/26/2016   MICROALBUR <0.7 06/26/2016       Assessment & Plan:

## 2017-07-13 NOTE — Assessment & Plan Note (Signed)
Lab Results  Component Value Date   LDLCALC 136 (H) 06/26/2016  Not interested in statin after d/w pt today, will check lab today, consider zetia,  to f/u any worsening symptoms or concerns

## 2017-07-13 NOTE — Assessment & Plan Note (Signed)

## 2017-07-13 NOTE — Patient Instructions (Addendum)
OK to increase the propranolol to twice per day  Please continue all other medications as before, and refills have been done if requested.  Please have the pharmacy call with any other refills you may need.  Please continue your efforts at being more active, low cholesterol diet, and weight control.  You are otherwise up to date with prevention measures today.  Please keep your appointments with your specialists as you may have planned  Please go to the LAB in the Basement (turn left off the elevator) for the tests to be done today  You will be contacted by phone if any changes need to be made immediately.  Otherwise, you will receive a letter about your results with an explanation, but please check with MyChart first.  Please remember to sign up for MyChart if you have not done so, as this will be important to you in the future with finding out test results, communicating by private email, and scheduling acute appointments online when needed.  Please return in 6 months, or sooner if needed, with Lab testing done 3-5 days before

## 2017-07-13 NOTE — Assessment & Plan Note (Signed)
Lab Results  Component Value Date   HGBA1C 7.0 (H) 06/26/2016  stable overall by history and exam, recent data reviewed with pt, and pt to continue medical treatment as before,  to f/u any worsening symptoms or concerns

## 2017-07-13 NOTE — Assessment & Plan Note (Signed)
BP Readings from Last 3 Encounters:  07/13/17 112/78  06/26/16 130/78  05/30/15 132/82

## 2017-08-06 ENCOUNTER — Other Ambulatory Visit: Payer: Self-pay | Admitting: Internal Medicine

## 2017-08-06 ENCOUNTER — Telehealth: Payer: Self-pay | Admitting: Internal Medicine

## 2017-08-06 ENCOUNTER — Other Ambulatory Visit: Payer: Self-pay | Admitting: *Deleted

## 2017-08-06 MED ORDER — LOSARTAN POTASSIUM-HCTZ 100-12.5 MG PO TABS: 1.0000 | ORAL_TABLET | Freq: Every day | ORAL | 3 refills | Status: DC

## 2017-08-06 NOTE — Telephone Encounter (Signed)
Copied from CRM [id number]. Topic: Quick Communication - Rx Refill/Question >> Aug 06, 2017  9:17 AM Everardo PacificMoton, Kelly, NT wrote: Medication: Losartan   Has the patient contacted their pharmacy? Yes Patients wife stated that she has called the pharmacy and was told by them she needed to call the doctors office  Preferred Pharm[address] (with phone number or street name): [facility] [address] [place] [phone]   Agent: Please be advised that RX refills may take up to 3 business days. We ask that you follow-up with your pharmacy.

## 2017-10-28 ENCOUNTER — Other Ambulatory Visit: Payer: Self-pay | Admitting: Internal Medicine

## 2017-10-28 MED ORDER — LOSARTAN POTASSIUM 100 MG PO TABS: 100.0000 mg | ORAL_TABLET | Freq: Every day | ORAL | 3 refills | Status: DC

## 2017-10-28 MED ORDER — HYDROCHLOROTHIAZIDE 12.5 MG PO CAPS: 12.5000 mg | ORAL_CAPSULE | Freq: Every day | ORAL | 3 refills | Status: DC

## 2018-01-11 ENCOUNTER — Ambulatory Visit: Payer: PPO | Admitting: Internal Medicine

## 2018-01-11 DIAGNOSIS — Z0289 Encounter for other administrative examinations: Secondary | ICD-10-CM

## 2018-02-01 DIAGNOSIS — H903 Sensorineural hearing loss, bilateral: Secondary | ICD-10-CM | POA: Diagnosis not present

## 2018-02-04 ENCOUNTER — Other Ambulatory Visit: Payer: Self-pay | Admitting: Otolaryngology

## 2018-02-04 DIAGNOSIS — H903 Sensorineural hearing loss, bilateral: Secondary | ICD-10-CM

## 2018-02-15 ENCOUNTER — Ambulatory Visit
Admission: RE | Admit: 2018-02-15 | Discharge: 2018-02-15 | Disposition: A | Discharge status: Home / Self Care | Payer: PPO | Source: Ambulatory Visit | Attending: Otolaryngology | Admitting: Otolaryngology

## 2018-02-15 DIAGNOSIS — H903 Sensorineural hearing loss, bilateral: Secondary | ICD-10-CM

## 2018-02-15 MED ORDER — GADOBENATE DIMEGLUMINE 529 MG/ML IV SOLN
20.0000 mL | Freq: Once | INTRAVENOUS | Status: AC | PRN
  Administered 2018-02-15: 20 mL via INTRAVENOUS

## 2018-03-15 ENCOUNTER — Encounter: Payer: Self-pay | Admitting: Internal Medicine

## 2018-03-15 ENCOUNTER — Other Ambulatory Visit (INDEPENDENT_AMBULATORY_CARE_PROVIDER_SITE_OTHER): Payer: PPO

## 2018-03-15 ENCOUNTER — Ambulatory Visit (INDEPENDENT_AMBULATORY_CARE_PROVIDER_SITE_OTHER): Payer: PPO | Admitting: Internal Medicine

## 2018-03-15 ENCOUNTER — Other Ambulatory Visit: Payer: Self-pay | Admitting: Internal Medicine

## 2018-03-15 VITALS — BP 122/78 | HR 90 | Temp 98.5°F | Ht 68.0 in | Wt 203.0 lb

## 2018-03-15 DIAGNOSIS — Z0000 Encounter for general adult medical examination without abnormal findings: Secondary | ICD-10-CM

## 2018-03-15 DIAGNOSIS — E119 Type 2 diabetes mellitus without complications: Secondary | ICD-10-CM | POA: Diagnosis not present

## 2018-03-15 DIAGNOSIS — I10 Essential (primary) hypertension: Secondary | ICD-10-CM

## 2018-03-15 LAB — CBC WITH DIFFERENTIAL/PLATELET
Basophils Absolute: 0.1 10*3/uL (ref 0.0–0.1)
Basophils Relative: 1.2 % (ref 0.0–3.0)
Eosinophils Absolute: 0.3 10*3/uL (ref 0.0–0.7)
Eosinophils Relative: 3 % (ref 0.0–5.0)
HCT: 45.9 % (ref 39.0–52.0)
Hemoglobin: 15.5 g/dL (ref 13.0–17.0)
Lymphocytes Relative: 21 % (ref 12.0–46.0)
Lymphs Abs: 2.3 10*3/uL (ref 0.7–4.0)
MCHC: 33.8 g/dL (ref 30.0–36.0)
MCV: 99.7 fl (ref 78.0–100.0)
Monocytes Absolute: 1.6 10*3/uL — ABNORMAL HIGH (ref 0.1–1.0)
Monocytes Relative: 14.8 % — ABNORMAL HIGH (ref 3.0–12.0)
Neutro Abs: 6.5 10*3/uL (ref 1.4–7.7)
Neutrophils Relative %: 60 % (ref 43.0–77.0)
Platelets: 263 10*3/uL (ref 150.0–400.0)
RBC: 4.61 Mil/uL (ref 4.22–5.81)
RDW: 13.9 % (ref 11.5–15.5)
WBC: 10.9 10*3/uL — ABNORMAL HIGH (ref 4.0–10.5)

## 2018-03-15 LAB — URINALYSIS, ROUTINE W REFLEX MICROSCOPIC
Bilirubin Urine: NEGATIVE
Hgb urine dipstick: NEGATIVE
Ketones, ur: NEGATIVE
Leukocytes, UA: NEGATIVE
Nitrite: NEGATIVE
Specific Gravity, Urine: 1.01 (ref 1.000–1.030)
Total Protein, Urine: NEGATIVE
Urine Glucose: NEGATIVE
Urobilinogen, UA: 0.2 (ref 0.0–1.0)
pH: 5.5 (ref 5.0–8.0)

## 2018-03-15 LAB — BASIC METABOLIC PANEL
BUN: 16 mg/dL (ref 6–23)
CO2: 31 mEq/L (ref 19–32)
Calcium: 9.9 mg/dL (ref 8.4–10.5)
Chloride: 98 mEq/L (ref 96–112)
Creatinine, Ser: 1.1 mg/dL (ref 0.40–1.50)
GFR: 68.98 mL/min (ref >60.00)
Glucose, Bld: 118 mg/dL — ABNORMAL HIGH (ref 70–99)
Potassium: 4.1 mEq/L (ref 3.5–5.1)
Sodium: 137 mEq/L (ref 135–145)

## 2018-03-15 LAB — HEPATIC FUNCTION PANEL
ALT: 22 U/L (ref 0–53)
AST: 19 U/L (ref 0–37)
Albumin: 4.2 g/dL (ref 3.5–5.2)
Alkaline Phosphatase: 65 U/L (ref 39–117)
Bilirubin, Direct: 0.2 mg/dL (ref 0.0–0.3)
Total Bilirubin: 1 mg/dL (ref 0.2–1.2)
Total Protein: 7.2 g/dL (ref 6.0–8.3)

## 2018-03-15 LAB — LIPID PANEL
Cholesterol: 172 mg/dL (ref 0–200)
HDL: 59 mg/dL (ref >39.00)
LDL Cholesterol: 94 mg/dL (ref 0–99)
NonHDL: 112.97
Total CHOL/HDL Ratio: 3
Triglycerides: 93 mg/dL (ref 0.0–149.0)
VLDL: 18.6 mg/dL (ref 0.0–40.0)

## 2018-03-15 LAB — MICROALBUMIN / CREATININE URINE RATIO
Creatinine,U: 46.5 mg/dL
Microalb Creat Ratio: 1.5 mg/g (ref 0.0–30.0)
Microalb, Ur: <0.7 mg/dL (ref 0.0–1.9)

## 2018-03-15 LAB — PSA: PSA: 2.13 ng/mL (ref 0.10–4.00)

## 2018-03-15 LAB — HEMOGLOBIN A1C: Hgb A1c MFr Bld: 7 % — ABNORMAL HIGH (ref 4.6–6.5)

## 2018-03-15 LAB — TSH: TSH: 7.06 u[IU]/mL — ABNORMAL HIGH (ref 0.35–4.50)

## 2018-03-15 MED ORDER — LEVOTHYROXINE SODIUM 25 MCG PO TABS: 25.0000 ug | ORAL_TABLET | Freq: Every day | ORAL | 3 refills | Status: DC

## 2018-03-15 NOTE — Progress Notes (Signed)
Subjective:    Patient ID: Ula LingoJames W Botello, male    DOB: 06-Aug-1940, 77 y.o.   MRN: [id number]  HPI  Here for wellness and f/u;  Overall doing ok;  Pt denies Chest pain, worsening SOB, DOE, wheezing, orthopnea, PND, worsening LE edema, palpitations, dizziness or syncope.  Pt denies neurological change such as new headache, facial or extremity weakness.  Pt denies polydipsia, polyuria, or low sugar symptoms. Pt states overall good compliance with treatment and medications, good tolerability, and has been trying to follow appropriate diet.  Pt denies worsening depressive symptoms, suicidal ideation or panic. No fever, night sweats, wt loss, loss of appetite, or other constitutional symptoms.  Pt states good ability with ADL's, has low fall risk, home safety reviewed and adequate, no other significant changes in hearing or vision, and only occasionally active with exercise.  Has ongoing hearing loss x 4 wks, seen per ENT, MRI head neg.  No other new complaints Past Medical History:  Diagnosis Date  . BACK PAIN 01/12/2008  . COLONIC POLYPS, HX OF 01/12/2008  . Cough 05/28/2010  . Encounter for well adult exam with abnormal findings 12/29/2010  . HYPERLIPIDEMIA 01/12/2008  . HYPERTENSION 01/12/2008  . LEUKOCYTOSIS 03/02/2008  . PERIPHERAL EDEMA 06/28/2010  . SWELLING MASS OR LUMP IN HEAD AND NECK 05/28/2010  . Tremor 08/02/2012   Past Surgical History:  Procedure Laterality Date  . APPENDECTOMY    . CATARACT EXTRACTION    . CHOLECYSTECTOMY      reports that he has been smoking. He has never used smokeless tobacco. He reports that he drinks alcohol. He reports that he does not use drugs. family history includes Cancer in his other; Heart disease in his other; Hypertension in his other. No Known Allergies Current Outpatient Medications on File Prior to Visit  Medication Sig Dispense Refill  . aspirin 81 MG tablet Take 81 mg by mouth daily.      . Glucosamine 500 MG CAPS Take 500 mg by mouth daily.     .  hydrochlorothiazide (MICROZIDE) 12.5 MG capsule Take 1 capsule (12.5 mg total) by mouth daily. 90 capsule 3  . losartan (COZAAR) 100 MG tablet Take 1 tablet (100 mg total) by mouth daily. 90 tablet 3  . propranolol (INDERAL) 10 MG tablet Take 1 tablet (10 mg total) by mouth 2 (two) times daily. Must keep appt w/provider for future refills 60 tablet 11   No current facility-administered medications on file prior to visit.    Review of Systems Constitutional: Negative for other unusual diaphoresis, sweats, appetite or weight changes HENT: Negative for other worsening hearing loss, ear pain, facial swelling, mouth sores or neck stiffness.   Eyes: Negative for other worsening pain, redness or other visual disturbance.  Respiratory: Negative for other stridor or swelling Cardiovascular: Negative for other palpitations or other chest pain  Gastrointestinal: Negative for worsening diarrhea or loose stools, blood in stool, distention or other pain Genitourinary: Negative for hematuria, flank pain or other change in urine volume.  Musculoskeletal: Negative for myalgias or other joint swelling.  Skin: Negative for other color change, or other wound or worsening drainage.  Neurological: Negative for other syncope or numbness. Hematological: Negative for other adenopathy or swelling Psychiatric/Behavioral: Negative for hallucinations, other worsening agitation, SI, self-injury, or new decreased concentration All other system neg per pt    Objective:   Physical Exam BP 122/78   Pulse 90   Temp 98.5 F (36.9 C) (Oral)   Ht 5\' 8"  (  1.727 m)   Wt 203 lb (92.1 kg)   SpO2 96%   BMI 30.87 kg/m  VS noted,  Constitutional: Pt is oriented to person, place, and time. Appears well-developed and well-nourished, in no significant distress and comfortable Head: Normocephalic and atraumatic  Eyes: Conjunctivae and EOM are normal. Pupils are equal, round, and reactive to light Right Ear: External ear normal  without discharge Left Ear: External ear normal without discharge Nose: Nose without discharge or deformity Mouth/Throat: Oropharynx is without other ulcerations and moist  Neck: Normal range of motion. Neck supple. No JVD present. No tracheal deviation present or significant neck LA or mass Cardiovascular: Normal rate, regular rhythm, normal heart sounds and intact distal pulses.   Pulmonary/Chest: WOB normal and breath sounds without rales or wheezing  Abdominal: Soft. Bowel sounds are normal. NT. No HSM  Musculoskeletal: Normal range of motion. Exhibits chronic bilat 1+ leg  edema Lymphadenopathy: Has no other cervical adenopathy.  Neurological: Pt is alert and oriented to person, place, and time. Pt has normal reflexes. No cranial nerve deficit. Motor grossly intact, Gait intact Skin: Skin is warm and dry. No rash noted or new ulcerations Psychiatric:  Has normal mood and affect. Behavior is normal without agitation\ No other exam findings Lab Results  Component Value Date   WBC 10.9 (H) 03/15/2018   HGB 15.5 03/15/2018   HCT 45.9 03/15/2018   PLT 263.0 03/15/2018   GLUCOSE 118 (H) 03/15/2018   CHOL 172 03/15/2018   TRIG 93.0 03/15/2018   HDL 59.00 03/15/2018   LDLDIRECT 116.7 05/28/2010   LDLCALC 94 03/15/2018   ALT 22 03/15/2018   AST 19 03/15/2018   NA 137 03/15/2018   K 4.1 03/15/2018   CL 98 03/15/2018   CREATININE 1.10 03/15/2018   BUN 16 03/15/2018   CO2 31 03/15/2018   TSH 7.06 (H) 03/15/2018   PSA 2.13 03/15/2018   INR 0.93 11/28/2013   HGBA1C 7.0 (H) 03/15/2018   MICROALBUR <0.7 03/15/2018       Assessment & Plan:

## 2018-03-15 NOTE — Assessment & Plan Note (Signed)

## 2018-03-15 NOTE — Assessment & Plan Note (Signed)
stable overall by history and exam, recent data reviewed with pt, and pt to continue medical treatment as before,  to f/u any worsening symptoms or concerns  

## 2018-03-15 NOTE — Patient Instructions (Signed)
Please continue all other medications as before, and refills have been done if requested.  Please have the pharmacy call with any other refills you may need.  Please continue your efforts at being more active, low cholesterol diet, and weight control.  You are otherwise up to date with prevention measures today.  Please keep your appointments with your specialists as you may have planned  Please go to the LAB in the Basement (turn left off the elevator) for the tests to be done today  You will be contacted by phone if any changes need to be made immediately.  Otherwise, you will receive a letter about your results with an explanation, but please check with MyChart first.  Please return in 6 months, or sooner if needed, with Lab testing done 3-5 days before  

## 2018-08-19 ENCOUNTER — Other Ambulatory Visit: Payer: Self-pay | Admitting: Internal Medicine

## 2018-08-19 NOTE — Telephone Encounter (Signed)
Copied from CRM [id number]. Topic: Quick Communication - Rx Refill/Question >> Aug 19, 2018  9:57 AM Lynne Logan D wrote: Medication: propranolol (INDERAL) 10 MG tablet  Has the patient contacted their pharmacy? Yes.   (Agent: If no, request that the patient contact the pharmacy for the refill.) (Agent: If yes, when and what did the pharmacy advise?)  Preferred Pharmacy (with phone number or street name): [facility] [address], [place] - [address] [phone] (Phone) [phone] (Fax)  Agent: Please be advised that RX refills may take up to 3 business days. We ask that you follow-up with your pharmacy.

## 2018-09-15 ENCOUNTER — Encounter: Payer: Self-pay | Admitting: Internal Medicine

## 2018-09-15 ENCOUNTER — Other Ambulatory Visit (INDEPENDENT_AMBULATORY_CARE_PROVIDER_SITE_OTHER): Payer: Medicare HMO

## 2018-09-15 ENCOUNTER — Ambulatory Visit (INDEPENDENT_AMBULATORY_CARE_PROVIDER_SITE_OTHER): Payer: Medicare HMO | Admitting: Internal Medicine

## 2018-09-15 ENCOUNTER — Other Ambulatory Visit: Payer: Self-pay | Admitting: Internal Medicine

## 2018-09-15 VITALS — BP 124/86 | HR 100 | Temp 98.0°F | Ht 68.0 in | Wt 211.0 lb

## 2018-09-15 DIAGNOSIS — Z23 Encounter for immunization: Secondary | ICD-10-CM

## 2018-09-15 DIAGNOSIS — E119 Type 2 diabetes mellitus without complications: Secondary | ICD-10-CM

## 2018-09-15 DIAGNOSIS — E039 Hypothyroidism, unspecified: Secondary | ICD-10-CM | POA: Diagnosis not present

## 2018-09-15 DIAGNOSIS — Z0000 Encounter for general adult medical examination without abnormal findings: Secondary | ICD-10-CM

## 2018-09-15 HISTORY — DX: Hypothyroidism, unspecified: E03.9

## 2018-09-15 LAB — CBC WITH DIFFERENTIAL/PLATELET
Basophils Absolute: 0.2 10*3/uL — ABNORMAL HIGH (ref 0.0–0.1)
Basophils Relative: 1.4 % (ref 0.0–3.0)
Eosinophils Absolute: 0.3 10*3/uL (ref 0.0–0.7)
Eosinophils Relative: 2.8 % (ref 0.0–5.0)
HCT: 44.9 % (ref 39.0–52.0)
Hemoglobin: 14.9 g/dL (ref 13.0–17.0)
Lymphocytes Relative: 23.9 % (ref 12.0–46.0)
Lymphs Abs: 2.8 10*3/uL (ref 0.7–4.0)
MCHC: 33.3 g/dL (ref 30.0–36.0)
MCV: 99.2 fl (ref 78.0–100.0)
Monocytes Absolute: 1.7 10*3/uL — ABNORMAL HIGH (ref 0.1–1.0)
Monocytes Relative: 14.8 % — ABNORMAL HIGH (ref 3.0–12.0)
Neutro Abs: 6.7 10*3/uL (ref 1.4–7.7)
Neutrophils Relative %: 57.1 % (ref 43.0–77.0)
Platelets: 287 10*3/uL (ref 150.0–400.0)
RBC: 4.53 Mil/uL (ref 4.22–5.81)
RDW: 13.9 % (ref 11.5–15.5)
WBC: 11.8 10*3/uL — ABNORMAL HIGH (ref 4.0–10.5)

## 2018-09-15 LAB — LIPID PANEL
Cholesterol: 180 mg/dL (ref 0–200)
HDL: 53.8 mg/dL (ref >39.00)
LDL Cholesterol: 99 mg/dL (ref 0–99)
NonHDL: 126.26
Total CHOL/HDL Ratio: 3
Triglycerides: 134 mg/dL (ref 0.0–149.0)
VLDL: 26.8 mg/dL (ref 0.0–40.0)

## 2018-09-15 LAB — URINALYSIS, ROUTINE W REFLEX MICROSCOPIC
Bilirubin Urine: NEGATIVE
Hgb urine dipstick: NEGATIVE
Ketones, ur: NEGATIVE
Leukocytes,Ua: NEGATIVE
Nitrite: NEGATIVE
RBC / HPF: NONE SEEN (ref 0–?)
Specific Gravity, Urine: 1.025 (ref 1.000–1.030)
Total Protein, Urine: NEGATIVE
Urine Glucose: NEGATIVE
Urobilinogen, UA: 0.2 (ref 0.0–1.0)
pH: 5.5 (ref 5.0–8.0)

## 2018-09-15 LAB — MICROALBUMIN / CREATININE URINE RATIO
Creatinine,U: 79.9 mg/dL
Microalb Creat Ratio: 0.9 mg/g (ref 0.0–30.0)
Microalb, Ur: <0.7 mg/dL (ref 0.0–1.9)

## 2018-09-15 LAB — BASIC METABOLIC PANEL
BUN: 24 mg/dL — ABNORMAL HIGH (ref 6–23)
CO2: 26 mEq/L (ref 19–32)
Calcium: 9.3 mg/dL (ref 8.4–10.5)
Chloride: 98 mEq/L (ref 96–112)
Creatinine, Ser: 1.1 mg/dL (ref 0.40–1.50)
GFR: 64.81 mL/min (ref >60.00)
Glucose, Bld: 128 mg/dL — ABNORMAL HIGH (ref 70–99)
Potassium: 3.8 mEq/L (ref 3.5–5.1)
Sodium: 139 mEq/L (ref 135–145)

## 2018-09-15 LAB — HEPATIC FUNCTION PANEL
ALT: 16 U/L (ref 0–53)
AST: 14 U/L (ref 0–37)
Albumin: 4 g/dL (ref 3.5–5.2)
Alkaline Phosphatase: 62 U/L (ref 39–117)
Bilirubin, Direct: 0.1 mg/dL (ref 0.0–0.3)
Total Bilirubin: 0.6 mg/dL (ref 0.2–1.2)
Total Protein: 7 g/dL (ref 6.0–8.3)

## 2018-09-15 LAB — PSA: PSA: 2.14 ng/mL (ref 0.10–4.00)

## 2018-09-15 LAB — HEMOGLOBIN A1C: Hgb A1c MFr Bld: 7.4 % — ABNORMAL HIGH (ref 4.6–6.5)

## 2018-09-15 LAB — TSH: TSH: 3.81 u[IU]/mL (ref 0.35–4.50)

## 2018-09-15 MED ORDER — METFORMIN HCL ER 500 MG PO TB24: 500.0000 mg | ORAL_TABLET | Freq: Every day | ORAL | 3 refills | Status: DC

## 2018-09-15 NOTE — Assessment & Plan Note (Signed)

## 2018-09-15 NOTE — Patient Instructions (Signed)

## 2018-09-15 NOTE — Assessment & Plan Note (Signed)
On new low dose thryoid replacement, for f/u lab

## 2018-09-15 NOTE — Progress Notes (Signed)
Subjective:    Patient ID: Vincent Campbell, male    DOB: 12-Jun-1941, 78 y.o.   MRN: [id number]  HPI   Here for wellness and f/u;  Overall doing ok;  Pt denies Chest pain, worsening SOB, DOE, wheezing, orthopnea, PND, worsening LE edema, palpitations, dizziness or syncope.  Pt denies neurological change such as new headache, facial or extremity weakness.  Pt denies polydipsia, polyuria, or low sugar symptoms. Pt states overall good compliance with treatment and medications, good tolerability, and has been trying to follow appropriate diet.  Pt denies worsening depressive symptoms, suicidal ideation or panic. No fever, night sweats, wt loss, loss of appetite, or other constitutional symptoms.  Pt states good ability with ADL's, has low fall risk, home safety reviewed and adequate, no other significant changes in hearing or vision, and only occasionally active with exercise. Fell at the top of the stairs 2 days ago with catching the toe on the stair.  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 Denies hyper or hypo thyroid symptoms such as voice, skin or hair change. Past Medical History:  Diagnosis Date  . BACK PAIN 01/12/2008  . COLONIC POLYPS, HX OF 01/12/2008  . Cough 05/28/2010  . Encounter for well adult exam with abnormal findings 12/29/2010  . HYPERLIPIDEMIA 01/12/2008  . HYPERTENSION 01/12/2008  . LEUKOCYTOSIS 03/02/2008  . PERIPHERAL EDEMA 06/28/2010  . SWELLING MASS OR LUMP IN HEAD AND NECK 05/28/2010  . Tremor 08/02/2012   Past Surgical History:  Procedure Laterality Date  . APPENDECTOMY    . CATARACT EXTRACTION    . CHOLECYSTECTOMY      reports that he has been smoking. He has never used smokeless tobacco. He reports current alcohol use. He reports that he does not use drugs. family history includes Cancer in an other family member; Heart disease in an other family member; Hypertension in an other family member. No Known Allergies Current Outpatient  Medications on File Prior to Visit  Medication Sig Dispense Refill  . aspirin 81 MG tablet Take 81 mg by mouth daily.      . Glucosamine 500 MG CAPS Take 500 mg by mouth daily.     . hydrochlorothiazide (MICROZIDE) 12.5 MG capsule Take 1 capsule (12.5 mg total) by mouth daily. 90 capsule 3  . levothyroxine (SYNTHROID, LEVOTHROID) 25 MCG tablet Take 1 tablet (25 mcg total) by mouth daily before breakfast. 90 tablet 3  . losartan (COZAAR) 100 MG tablet Take 1 tablet (100 mg total) by mouth daily. 90 tablet 3  . propranolol (INDERAL) 10 MG tablet TAKE 1 TABLET BY MOUTH TWICE DAILY. MUST KEEP APPOINTMENT BEFORE FURTHER REFILLS. 60 tablet 0   No current facility-administered medications on file prior to visit.    Review of Systems Constitutional: Negative for other unusual diaphoresis, sweats, appetite or weight changes HENT: Negative for other worsening hearing loss, ear pain, facial swelling, mouth sores or neck stiffness.   Eyes: Negative for other worsening pain, redness or other visual disturbance.  Respiratory: Negative for other stridor or swelling Cardiovascular: Negative for other palpitations or other chest pain  Gastrointestinal: Negative for worsening diarrhea or loose stools, blood in stool, distention or other pain Genitourinary: Negative for hematuria, flank pain or other change in urine volume.  Musculoskeletal: Negative for myalgias or other joint swelling.  Skin: Negative for other color change, or other wound or worsening drainage.  Neurological: Negative for other syncope or numbness. Hematological: Negative for  other adenopathy or swelling Psychiatric/Behavioral: Negative for hallucinations, other worsening agitation, SI, self-injury, or new decreased concentration All other system neg per pt    Objective:   Physical Exam BP 124/86   Pulse 100   Temp 98 F (36.7 C) (Oral)   Ht 5\' 8"  (1.727 m)   Wt 211 lb (95.7 kg)   SpO2 98%   BMI 32.08 kg/m  VS noted,    Constitutional: Pt is oriented to person, place, and time. Appears well-developed and well-nourished, in no significant distress and comfortable Head: Normocephalic and atraumatic  Eyes: Conjunctivae and EOM are normal. Pupils are equal, round, and reactive to light Right Ear: External ear normal without discharge Left Ear: External ear normal without discharge Nose: Nose without discharge or deformity Mouth/Throat: Oropharynx is without other ulcerations and moist  Neck: Normal range of motion. Neck supple. No JVD present. No tracheal deviation present or significant neck LA or mass Cardiovascular: Normal rate, regular rhythm, normal heart sounds and intact distal pulses.   Pulmonary/Chest: WOB normal and breath sounds without rales or wheezing  Abdominal: Soft. Bowel sounds are normal. NT. No HSM  Musculoskeletal: Normal range of motion. Exhibits no edema Lymphadenopathy: Has no other cervical adenopathy.  Neurological: Pt is alert and oriented to person, place, and time. Pt has normal reflexes. No cranial nerve deficit. Motor grossly intact, Gait intact Skin: Skin is warm and dry. No rash noted or new ulcerations Psychiatric:  Has normal mood and affect. Behavior is normal without agitation\ No other exam findings Lab Results  Component Value Date   WBC 11.8 (H) 09/15/2018   HGB 14.9 09/15/2018   HCT 44.9 09/15/2018   PLT 287.0 09/15/2018   GLUCOSE 128 (H) 09/15/2018   CHOL 180 09/15/2018   TRIG 134.0 09/15/2018   HDL 53.80 09/15/2018   LDLDIRECT 116.7 05/28/2010   LDLCALC 99 09/15/2018   ALT 16 09/15/2018   AST 14 09/15/2018   NA 139 09/15/2018   K 3.8 09/15/2018   CL 98 09/15/2018   CREATININE 1.10 09/15/2018   BUN 24 (H) 09/15/2018   CO2 26 09/15/2018   TSH 3.81 09/15/2018   PSA 2.14 09/15/2018   INR 0.93 11/28/2013   HGBA1C 7.4 (H) 09/15/2018   MICROALBUR <0.7 09/15/2018       Assessment & Plan:

## 2018-09-15 NOTE — Assessment & Plan Note (Signed)
stable overall by history and exam, recent data reviewed with pt, and pt to continue medical treatment as before,  to f/u any worsening symptoms or concerns  

## 2018-09-16 ENCOUNTER — Telehealth: Payer: Self-pay

## 2018-09-16 NOTE — Telephone Encounter (Signed)
-----   Message from Corwin Levins, MD sent at 09/15/2018  8:02 PM EST ----- Left message on MyChart, pt to cont same tx except  The test results show that your current treatment is OK, except the A1c is mildly higher.  At this point, we need to start metformin ER 500 mg - 1 in the AM.  I will send the prescription, and you should hear from the office as well.Lendell Caprice to please inform pt, I will do rx

## 2018-09-16 NOTE — Telephone Encounter (Signed)
Pt has been informed of results and expressed understanding.  °

## 2018-10-21 ENCOUNTER — Other Ambulatory Visit: Payer: Self-pay | Admitting: Internal Medicine

## 2018-10-27 ENCOUNTER — Other Ambulatory Visit: Payer: Self-pay | Admitting: Internal Medicine

## 2019-01-22 ENCOUNTER — Other Ambulatory Visit: Payer: Self-pay | Admitting: Internal Medicine

## 2019-01-25 ENCOUNTER — Telehealth: Payer: Self-pay | Admitting: Internal Medicine

## 2019-01-25 MED ORDER — LOSARTAN POTASSIUM 100 MG PO TABS: 100.0000 mg | ORAL_TABLET | Freq: Every day | ORAL | 0 refills | Status: DC

## 2019-01-25 NOTE — Telephone Encounter (Signed)
Medication: losartan (COZAAR) 100 MG tablet   Has the patient contacted their pharmacy? No (Agent: If no, request that the patient contact the pharmacy for the refill.) (Agent: If yes, when and what did the pharmacy advise?)  Preferred Pharmacy (with phone number or street name): [facility] [place], [place]. [phone] (Phone) [phone] (Fax)     Agent: Please be advised that RX refills may take up to 3 business days. We ask that you follow-up with your pharmacy.

## 2019-02-08 ENCOUNTER — Encounter: Payer: Self-pay | Admitting: Internal Medicine

## 2019-02-08 ENCOUNTER — Ambulatory Visit (INDEPENDENT_AMBULATORY_CARE_PROVIDER_SITE_OTHER): Payer: Medicare HMO | Admitting: Internal Medicine

## 2019-02-08 ENCOUNTER — Telehealth: Payer: Self-pay | Admitting: *Deleted

## 2019-02-08 DIAGNOSIS — Z20822 Contact with and (suspected) exposure to covid-19: Secondary | ICD-10-CM

## 2019-02-08 DIAGNOSIS — R05 Cough: Secondary | ICD-10-CM | POA: Diagnosis not present

## 2019-02-08 DIAGNOSIS — E119 Type 2 diabetes mellitus without complications: Secondary | ICD-10-CM | POA: Diagnosis not present

## 2019-02-08 DIAGNOSIS — R059 Cough, unspecified: Secondary | ICD-10-CM

## 2019-02-08 DIAGNOSIS — E785 Hyperlipidemia, unspecified: Secondary | ICD-10-CM

## 2019-02-08 MED ORDER — HYDROCODONE-HOMATROPINE 5-1.5 MG/5ML PO SYRP: 5.0000 mL | ORAL_SOLUTION | Freq: Four times a day (QID) | ORAL | 0 refills | Status: AC | PRN

## 2019-02-08 MED ORDER — LEVOFLOXACIN 500 MG PO TABS: 500.0000 mg | ORAL_TABLET | Freq: Every day | ORAL | 0 refills | Status: AC

## 2019-02-08 NOTE — Progress Notes (Signed)
Patient ID: Vincent Campbell, male   DOB: 08/04/1940, 78 y.o.   MRN: [id number]  Virtual Visit via Video Note  I connected with Vincent Campbell on 02/08/19 at 11:00 AM EDT by a video enabled telemedicine application and verified that I am speaking with the correct person using two identifiers.  Location: Patient: at home with wife Provider: at office   I discussed the limitations of evaluation and management by telemedicine and the availability of in person appointments. The patient expressed understanding and agreed to proceed.  History of Present Illness: Here with acute onset mild to mod 2-3 days ST, HA, general weakness and malaise, with prod cough off colored non bloody sputum, but Pt denies chest pain, increased sob or doe, wheezing, orthopnea, PND, increased LE swelling, palpitations, dizziness or syncope.  Pt denies new neurological symptoms such as new headache, or facial or extremity weakness or numbness   Pt denies polydipsia, polyuria Past Medical History:  Diagnosis Date  . BACK PAIN 01/12/2008  . COLONIC POLYPS, HX OF 01/12/2008  . Cough 05/28/2010  . Encounter for well adult exam with abnormal findings 12/29/2010  . HYPERLIPIDEMIA 01/12/2008  . HYPERTENSION 01/12/2008  . Hypothyroidism 09/15/2018  . LEUKOCYTOSIS 03/02/2008  . PERIPHERAL EDEMA 06/28/2010  . SWELLING MASS OR LUMP IN HEAD AND NECK 05/28/2010  . Tremor 08/02/2012   Past Surgical History:  Procedure Laterality Date  . APPENDECTOMY    . CATARACT EXTRACTION    . CHOLECYSTECTOMY      reports that he has been smoking. He has never used smokeless tobacco. He reports current alcohol use. He reports that he does not use drugs. family history includes Cancer in an other family member; Heart disease in an other family member; Hypertension in an other family member. No Known Allergies Current Outpatient Medications on File Prior to Visit  Medication Sig Dispense Refill  . aspirin 81 MG tablet Take 81 mg by mouth daily.      .  Glucosamine 500 MG CAPS Take 500 mg by mouth daily.     . hydrochlorothiazide (MICROZIDE) 12.5 MG capsule Take 1 capsule by mouth once daily 90 capsule 0  . levothyroxine (SYNTHROID, LEVOTHROID) 25 MCG tablet Take 1 tablet (25 mcg total) by mouth daily before breakfast. 90 tablet 3  . losartan (COZAAR) 100 MG tablet Take 1 tablet (100 mg total) by mouth daily. 90 tablet 0  . metFORMIN (GLUCOPHAGE-XR) 500 MG 24 hr tablet Take 1 tablet (500 mg total) by mouth daily with breakfast. 90 tablet 3  . propranolol (INDERAL) 10 MG tablet Take 1 tablet (10 mg total) by mouth 2 (two) times daily. 60 tablet 5   No current facility-administered medications on file prior to visit.     Observations/Objective: Alert, NAD, appropriate mood and affect, resps normal, cn 2-12 intact, moves all 4s, no visible rash or swelling Lab Results  Component Value Date   WBC 11.8 (H) 09/15/2018   HGB 14.9 09/15/2018   HCT 44.9 09/15/2018   PLT 287.0 09/15/2018   GLUCOSE 128 (H) 09/15/2018   CHOL 180 09/15/2018   TRIG 134.0 09/15/2018   HDL 53.80 09/15/2018   LDLDIRECT 116.7 05/28/2010   LDLCALC 99 09/15/2018   ALT 16 09/15/2018   AST 14 09/15/2018   NA 139 09/15/2018   K 3.8 09/15/2018   CL 98 09/15/2018   CREATININE 1.10 09/15/2018   BUN 24 (H) 09/15/2018   CO2 26 09/15/2018   TSH 3.81 09/15/2018   PSA 2.14 09/15/2018  INR 0.93 11/28/2013   HGBA1C 7.4 (H) 09/15/2018   MICROALBUR <0.7 09/15/2018   Assessment and Plan: See notes  Follow Up Instructions: See notes   I discussed the assessment and treatment plan with the patient. The patient was provided an opportunity to ask questions and all were answered. The patient agreed with the plan and demonstrated an understanding of the instructions.   The patient was advised to call back or seek an in-person evaluation if the symptoms worsen or if the condition fails to improve as anticipated.   Oliver BarreJames John, MD

## 2019-02-08 NOTE — Telephone Encounter (Signed)
Scheduled patient for COVID 19 test 02/09/2019 at [facility] at 9:45 am.  Message sent to patient's wife's provider requesting an order for her to be tested as well.  Testing protocol reviewed.

## 2019-02-08 NOTE — Assessment & Plan Note (Signed)
stable overall by history and exam, recent data reviewed with pt, and pt to continue medical treatment as before,  to f/u any worsening symptoms or concerns  

## 2019-02-08 NOTE — Telephone Encounter (Signed)
-----   Message from Biagio Borg, MD sent at 02/08/2019 11:19 AM EDT ----- Regarding: covid testing Please to contact pt regarding URI symptoms suggestive of COVID for testing  Wife would like to be tested as well if possible

## 2019-02-08 NOTE — Patient Instructions (Signed)
Please take all new medication as prescribed - the antibiotic, and cough medicine as needed  You should receive a call about the COVID testing  Please continue all other medications as before, and refills have been done if requested.  Please have the pharmacy call with any other refills you may need.  Please continue your efforts at being more active, low cholesterol diet, and weight control.  Please keep your appointments with your specialists as you may have planned

## 2019-02-08 NOTE — Assessment & Plan Note (Signed)
Etiology unclear, cant r/o covid vs other bacterial pna - Mild to mod, for antibx course, cough med prn, refer for COVID testing,  to f/u any worsening symptoms or concerns

## 2019-02-09 ENCOUNTER — Other Ambulatory Visit: Payer: Medicare HMO

## 2019-02-09 DIAGNOSIS — R6889 Other general symptoms and signs: Secondary | ICD-10-CM | POA: Diagnosis not present

## 2019-02-09 DIAGNOSIS — Z20822 Contact with and (suspected) exposure to covid-19: Secondary | ICD-10-CM

## 2019-02-13 LAB — NOVEL CORONAVIRUS, NAA: SARS-CoV-2, NAA: NOT DETECTED

## 2019-03-16 ENCOUNTER — Other Ambulatory Visit: Payer: Self-pay

## 2019-03-16 ENCOUNTER — Ambulatory Visit (INDEPENDENT_AMBULATORY_CARE_PROVIDER_SITE_OTHER): Payer: Medicare HMO | Admitting: Internal Medicine

## 2019-03-16 ENCOUNTER — Other Ambulatory Visit (INDEPENDENT_AMBULATORY_CARE_PROVIDER_SITE_OTHER): Payer: Medicare HMO

## 2019-03-16 ENCOUNTER — Encounter: Payer: Self-pay | Admitting: Internal Medicine

## 2019-03-16 ENCOUNTER — Other Ambulatory Visit: Payer: Self-pay | Admitting: Internal Medicine

## 2019-03-16 VITALS — BP 122/84 | HR 73 | Temp 98.7°F | Ht 68.0 in | Wt 194.0 lb

## 2019-03-16 DIAGNOSIS — E039 Hypothyroidism, unspecified: Secondary | ICD-10-CM | POA: Diagnosis not present

## 2019-03-16 DIAGNOSIS — E785 Hyperlipidemia, unspecified: Secondary | ICD-10-CM

## 2019-03-16 DIAGNOSIS — E119 Type 2 diabetes mellitus without complications: Secondary | ICD-10-CM

## 2019-03-16 DIAGNOSIS — I10 Essential (primary) hypertension: Secondary | ICD-10-CM | POA: Diagnosis not present

## 2019-03-16 DIAGNOSIS — Z23 Encounter for immunization: Secondary | ICD-10-CM

## 2019-03-16 LAB — BASIC METABOLIC PANEL
BUN: 16 mg/dL (ref 6–23)
CO2: 30 mEq/L (ref 19–32)
Calcium: 9.4 mg/dL (ref 8.4–10.5)
Chloride: 96 mEq/L (ref 96–112)
Creatinine, Ser: 0.93 mg/dL (ref 0.40–1.50)
GFR: 78.57 mL/min (ref >60.00)
Glucose, Bld: 127 mg/dL — ABNORMAL HIGH (ref 70–99)
Potassium: 3.5 mEq/L (ref 3.5–5.1)
Sodium: 134 mEq/L — ABNORMAL LOW (ref 135–145)

## 2019-03-16 LAB — HEPATIC FUNCTION PANEL
ALT: 14 U/L (ref 0–53)
AST: 14 U/L (ref 0–37)
Albumin: 3.9 g/dL (ref 3.5–5.2)
Alkaline Phosphatase: 82 U/L (ref 39–117)
Bilirubin, Direct: 0.1 mg/dL (ref 0.0–0.3)
Total Bilirubin: 0.8 mg/dL (ref 0.2–1.2)
Total Protein: 7.7 g/dL (ref 6.0–8.3)

## 2019-03-16 LAB — HEMOGLOBIN A1C: Hgb A1c MFr Bld: 7.6 % — ABNORMAL HIGH (ref 4.6–6.5)

## 2019-03-16 LAB — LIPID PANEL
Cholesterol: 184 mg/dL (ref 0–200)
HDL: 54.3 mg/dL (ref >39.00)
LDL Cholesterol: 110 mg/dL — ABNORMAL HIGH (ref 0–99)
NonHDL: 130.05
Total CHOL/HDL Ratio: 3
Triglycerides: 101 mg/dL (ref 0.0–149.0)
VLDL: 20.2 mg/dL (ref 0.0–40.0)

## 2019-03-16 MED ORDER — PROPRANOLOL HCL 10 MG PO TABS: 10.0000 mg | ORAL_TABLET | Freq: Two times a day (BID) | ORAL | 3 refills | Status: DC

## 2019-03-16 MED ORDER — METFORMIN HCL ER 500 MG PO TB24: 1000.0000 mg | ORAL_TABLET | Freq: Every day | ORAL | 3 refills | Status: DC

## 2019-03-16 MED ORDER — LOSARTAN POTASSIUM 100 MG PO TABS: 100.0000 mg | ORAL_TABLET | Freq: Every day | ORAL | 3 refills | Status: DC

## 2019-03-16 MED ORDER — HYDROCHLOROTHIAZIDE 12.5 MG PO CAPS: 12.5000 mg | ORAL_CAPSULE | Freq: Every day | ORAL | 3 refills | Status: DC

## 2019-03-16 MED ORDER — PRAVASTATIN SODIUM 20 MG PO TABS: 20.0000 mg | ORAL_TABLET | Freq: Every day | ORAL | 3 refills | Status: DC

## 2019-03-16 NOTE — Progress Notes (Signed)
Subjective:    Patient ID: Vincent Campbell, male    DOB: 1940/08/28, 78 y.o.   MRN: [id number]  HPI  Here to f/u; overall doing ok,  Pt denies chest pain, increasing sob or doe, wheezing, orthopnea, PND, increased LE swelling, palpitations, dizziness or syncope.  Pt denies new neurological symptoms such as new headache, or facial or extremity weakness or numbness.  Pt denies polydipsia, polyuria, or low sugar episode.  Pt states overall good compliance with meds, mostly trying to follow appropriate diet, with wt overall stable,  but little exercise however.  Very HOH, essenatially dear even wearing the hearing aids  No new complaints  Denies hyper or hypo thyroid symptoms such as voice, skin or hair change. Past Medical History:  Diagnosis Date  . BACK PAIN 01/12/2008  . COLONIC POLYPS, HX OF 01/12/2008  . Cough 05/28/2010  . Encounter for well adult exam with abnormal findings 12/29/2010  . HYPERLIPIDEMIA 01/12/2008  . HYPERTENSION 01/12/2008  . Hypothyroidism 09/15/2018  . LEUKOCYTOSIS 03/02/2008  . PERIPHERAL EDEMA 06/28/2010  . SWELLING MASS OR LUMP IN HEAD AND NECK 05/28/2010  . Tremor 08/02/2012   Past Surgical History:  Procedure Laterality Date  . APPENDECTOMY    . CATARACT EXTRACTION    . CHOLECYSTECTOMY      reports that he has been smoking. He has never used smokeless tobacco. He reports current alcohol use. He reports that he does not use drugs. family history includes Cancer in an other family member; Heart disease in an other family member; Hypertension in an other family member. No Known Allergies Current Outpatient Medications on File Prior to Visit  Medication Sig Dispense Refill  . aspirin 81 MG tablet Take 81 mg by mouth daily.      . Glucosamine 500 MG CAPS Take 500 mg by mouth daily.     Marland Kitchen levothyroxine (SYNTHROID, LEVOTHROID) 25 MCG tablet Take 1 tablet (25 mcg total) by mouth daily before breakfast. 90 tablet 3  . metFORMIN (GLUCOPHAGE-XR) 500 MG 24 hr tablet Take 1 tablet  (500 mg total) by mouth daily with breakfast. 90 tablet 3   No current facility-administered medications on file prior to visit.    Review of Systems  Constitutional: Negative for other unusual diaphoresis or sweats HENT: Negative for ear discharge or swelling Eyes: Negative for other worsening visual disturbances Respiratory: Negative for stridor or other swelling  Gastrointestinal: Negative for worsening distension or other blood Genitourinary: Negative for retention or other urinary change Musculoskeletal: Negative for other MSK pain or swelling Skin: Negative for color change or other new lesions Neurological: Negative for worsening tremors and other numbness  Psychiatric/Behavioral: Negative for worsening agitation or other fatigue All other system neg per pt    Objective:   Physical Exam BP 122/84   Pulse 73   Temp 98.7 F (37.1 C) (Oral)   Ht 5\' 8"  (1.727 m)   Wt 194 lb (88 kg)   SpO2 92%   BMI 29.50 kg/m  VS noted,  Constitutional: Pt appears in NAD HENT: Head: NCAT.  Right Ear: External ear normal.  Left Ear: External ear normal.  Eyes: . Pupils are equal, round, and reactive to light. Conjunctivae and EOM are normal Nose: without d/c or deformity Neck: Neck supple. Gross normal ROM Cardiovascular: Normal rate and regular rhythm.   Pulmonary/Chest: Effort normal and breath sounds without rales or wheezing.  Abd:  Soft, NT, ND, + BS, no organomegaly Neurological: Pt is alert. At baseline orientation, motor  grossly intact Skin: Skin is warm. No rashes, other new lesions, no LE edema Psychiatric: Pt behavior is normal without agitation  No other exam findings  Lab Results  Component Value Date   WBC 11.8 (H) 09/15/2018   HGB 14.9 09/15/2018   HCT 44.9 09/15/2018   PLT 287.0 09/15/2018   GLUCOSE 128 (H) 09/15/2018   CHOL 180 09/15/2018   TRIG 134.0 09/15/2018   HDL 53.80 09/15/2018   LDLDIRECT 116.7 05/28/2010   LDLCALC 99 09/15/2018   ALT 16 09/15/2018    AST 14 09/15/2018   NA 139 09/15/2018   K 3.8 09/15/2018   CL 98 09/15/2018   CREATININE 1.10 09/15/2018   BUN 24 (H) 09/15/2018   CO2 26 09/15/2018   TSH 3.81 09/15/2018   PSA 2.14 09/15/2018   INR 0.93 11/28/2013   HGBA1C 7.4 (H) 09/15/2018   MICROALBUR <0.7 09/15/2018        Assessment & Plan:

## 2019-03-16 NOTE — Assessment & Plan Note (Signed)
stable overall by history and exam, recent data reviewed with pt, and pt to continue medical treatment as before,  to f/u any worsening symptoms or concerns  

## 2019-03-16 NOTE — Patient Instructions (Signed)

## 2019-03-19 ENCOUNTER — Other Ambulatory Visit: Payer: Self-pay | Admitting: Internal Medicine

## 2019-03-21 ENCOUNTER — Telehealth: Payer: Self-pay | Admitting: Internal Medicine

## 2019-03-21 NOTE — Telephone Encounter (Signed)
°  Relation to pt: self  Call back number: [phone] Pharmacy: [facility], [place]. [phone] (Phone) [phone] (Fax)     Reason for call:  Patient requesting levothyroxine (SYNTHROID, LEVOTHROID) 25 MCG tablet, informed patient please allow 48 to 72 hour turn around, patient states he's cpompletly out .

## 2019-03-21 NOTE — Telephone Encounter (Signed)
Refill was submitted this morning.

## 2019-09-20 ENCOUNTER — Other Ambulatory Visit: Payer: Self-pay

## 2019-09-20 ENCOUNTER — Encounter: Payer: Self-pay | Admitting: Internal Medicine

## 2019-09-20 ENCOUNTER — Ambulatory Visit (INDEPENDENT_AMBULATORY_CARE_PROVIDER_SITE_OTHER): Payer: Medicare HMO | Admitting: Internal Medicine

## 2019-09-20 VITALS — BP 128/74 | HR 64 | Temp 98.2°F | Ht 68.0 in | Wt 203.4 lb

## 2019-09-20 DIAGNOSIS — E559 Vitamin D deficiency, unspecified: Secondary | ICD-10-CM

## 2019-09-20 DIAGNOSIS — Z0000 Encounter for general adult medical examination without abnormal findings: Secondary | ICD-10-CM

## 2019-09-20 DIAGNOSIS — E119 Type 2 diabetes mellitus without complications: Secondary | ICD-10-CM

## 2019-09-20 DIAGNOSIS — E538 Deficiency of other specified B group vitamins: Secondary | ICD-10-CM

## 2019-09-20 DIAGNOSIS — E611 Iron deficiency: Secondary | ICD-10-CM

## 2019-09-20 LAB — BASIC METABOLIC PANEL
BUN: 19 mg/dL (ref 6–23)
CO2: 29 mEq/L (ref 19–32)
Calcium: 9.4 mg/dL (ref 8.4–10.5)
Chloride: 95 mEq/L — ABNORMAL LOW (ref 96–112)
Creatinine, Ser: 1.06 mg/dL (ref 0.40–1.50)
GFR: 67.47 mL/min (ref >60.00)
Glucose, Bld: 150 mg/dL — ABNORMAL HIGH (ref 70–99)
Potassium: 3.9 mEq/L (ref 3.5–5.1)
Sodium: 134 mEq/L — ABNORMAL LOW (ref 135–145)

## 2019-09-20 LAB — URINALYSIS, ROUTINE W REFLEX MICROSCOPIC
Bilirubin Urine: NEGATIVE
Hgb urine dipstick: NEGATIVE
Ketones, ur: NEGATIVE
Leukocytes,Ua: NEGATIVE
Nitrite: NEGATIVE
RBC / HPF: NONE SEEN (ref 0–?)
Specific Gravity, Urine: 1.02 (ref 1.000–1.030)
Total Protein, Urine: NEGATIVE
Urine Glucose: NEGATIVE
Urobilinogen, UA: 0.2 (ref 0.0–1.0)
pH: 5.5 (ref 5.0–8.0)

## 2019-09-20 LAB — VITAMIN B12: Vitamin B-12: 505 pg/mL (ref 211–911)

## 2019-09-20 LAB — IBC PANEL
Iron: 145 ug/dL (ref 42–165)
Saturation Ratios: 45 % (ref 20.0–50.0)
Transferrin: 230 mg/dL (ref 212.0–360.0)

## 2019-09-20 LAB — CBC WITH DIFFERENTIAL/PLATELET
Basophils Absolute: 0.1 10*3/uL (ref 0.0–0.1)
Basophils Relative: 0.8 % (ref 0.0–3.0)
Eosinophils Absolute: 0.4 10*3/uL (ref 0.0–0.7)
Eosinophils Relative: 3.6 % (ref 0.0–5.0)
HCT: 45.5 % (ref 39.0–52.0)
Hemoglobin: 15.3 g/dL (ref 13.0–17.0)
Lymphocytes Relative: 24.9 % (ref 12.0–46.0)
Lymphs Abs: 2.9 10*3/uL (ref 0.7–4.0)
MCHC: 33.7 g/dL (ref 30.0–36.0)
MCV: 97.5 fl (ref 78.0–100.0)
Monocytes Absolute: 1.8 10*3/uL — ABNORMAL HIGH (ref 0.1–1.0)
Monocytes Relative: 15.7 % — ABNORMAL HIGH (ref 3.0–12.0)
Neutro Abs: 6.4 10*3/uL (ref 1.4–7.7)
Neutrophils Relative %: 55 % (ref 43.0–77.0)
Platelets: 291 10*3/uL (ref 150.0–400.0)
RBC: 4.67 Mil/uL (ref 4.22–5.81)
RDW: 13.7 % (ref 11.5–15.5)
WBC: 11.7 10*3/uL — ABNORMAL HIGH (ref 4.0–10.5)

## 2019-09-20 LAB — LIPID PANEL
Cholesterol: 202 mg/dL — ABNORMAL HIGH (ref 0–200)
HDL: 54.8 mg/dL (ref >39.00)
LDL Cholesterol: 129 mg/dL — ABNORMAL HIGH (ref 0–99)
NonHDL: 147.63
Total CHOL/HDL Ratio: 4
Triglycerides: 95 mg/dL (ref 0.0–149.0)
VLDL: 19 mg/dL (ref 0.0–40.0)

## 2019-09-20 LAB — HEPATIC FUNCTION PANEL
ALT: 14 U/L (ref 0–53)
AST: 16 U/L (ref 0–37)
Albumin: 4.2 g/dL (ref 3.5–5.2)
Alkaline Phosphatase: 62 U/L (ref 39–117)
Bilirubin, Direct: 0.2 mg/dL (ref 0.0–0.3)
Total Bilirubin: 0.9 mg/dL (ref 0.2–1.2)
Total Protein: 7.2 g/dL (ref 6.0–8.3)

## 2019-09-20 LAB — MICROALBUMIN / CREATININE URINE RATIO
Creatinine,U: 53.5 mg/dL
Microalb Creat Ratio: 1.3 mg/g (ref 0.0–30.0)
Microalb, Ur: <0.7 mg/dL (ref 0.0–1.9)

## 2019-09-20 LAB — TSH: TSH: 3.04 u[IU]/mL (ref 0.35–4.50)

## 2019-09-20 LAB — VITAMIN D 25 HYDROXY (VIT D DEFICIENCY, FRACTURES): VITD: 54.71 ng/mL (ref 30.00–100.00)

## 2019-09-20 LAB — PSA: PSA: 2.7 ng/mL (ref 0.10–4.00)

## 2019-09-20 MED ORDER — PRAVASTATIN SODIUM 20 MG PO TABS: 20.0000 mg | ORAL_TABLET | Freq: Every day | ORAL | 3 refills | Status: DC

## 2019-09-20 MED ORDER — METFORMIN HCL ER 500 MG PO TB24: 1000.0000 mg | ORAL_TABLET | Freq: Every day | ORAL | 3 refills | Status: DC

## 2019-09-20 MED ORDER — LEVOTHYROXINE SODIUM 25 MCG PO TABS: ORAL_TABLET | ORAL | 3 refills | Status: DC

## 2019-09-20 NOTE — Assessment & Plan Note (Signed)

## 2019-09-20 NOTE — Patient Instructions (Signed)
Please take all new medication as prescribed - the metformin for sugar, and the pravastatin for cholesterol  Please continue all other medications as before, and refills have been done if requested.  Please have the pharmacy call with any other refills you may need.  Please continue your efforts at being more active, low cholesterol diet, and weight control.  You are otherwise up to date with prevention measures today.  Please keep your appointments with your specialists as you may have planned  Please go to the LAB at the blood drawing area for the tests to be done  You will be contacted by phone if any changes need to be made immediately.  Otherwise, you will receive a letter about your results with an explanation, but please check with MyChart first.  Please remember to sign up for MyChart if you have not done so, as this will be important to you in the future with finding out test results, communicating by private email, and scheduling acute appointments online when needed.  Please make an Appointment to return in 6 months, or sooner if needed

## 2019-09-20 NOTE — Progress Notes (Signed)
Subjective:    Patient ID: Vincent Campbell, male    DOB: [birth year], 79 y.o.   MRN: [id number]  HPI  Here for wellness and f/u;  Overall doing ok;  Pt denies Chest pain, worsening SOB, DOE, wheezing, orthopnea, PND, worsening LE edema, palpitations, dizziness or syncope.  Pt denies neurological change such as new headache, facial or extremity weakness.  Pt denies polydipsia, polyuria, or low sugar symptoms. Pt states overall good compliance with treatment and medications, good tolerability, and has been trying to follow appropriate diet.  Pt denies worsening depressive symptoms, suicidal ideation or panic. No fever, night sweats, wt loss, loss of appetite, or other constitutional symptoms.  Pt states good ability with ADL's, has low fall risk, home safety reviewed and adequate, no other significant changes in hearing or vision, and only occasionally active with exercise. Plans to make eye exam appt soon.  Not taking the metformin at all nor the statin as had some difficulty with the pharmacy and just let it go. Past Medical History:  Diagnosis Date  . BACK PAIN 01/12/2008  . COLONIC POLYPS, HX OF 01/12/2008  . Cough 05/28/2010  . Encounter for well adult exam with abnormal findings 12/29/2010  . HYPERLIPIDEMIA 01/12/2008  . HYPERTENSION 01/12/2008  . Hypothyroidism 09/15/2018  . LEUKOCYTOSIS 03/02/2008  . PERIPHERAL EDEMA 06/28/2010  . SWELLING MASS OR LUMP IN HEAD AND NECK 05/28/2010  . Tremor 08/02/2012   Past Surgical History:  Procedure Laterality Date  . APPENDECTOMY    . CATARACT EXTRACTION    . CHOLECYSTECTOMY      reports that he has been smoking. He has never used smokeless tobacco. He reports current alcohol use. He reports that he does not use drugs. family history includes Cancer in an other family member; Heart disease in an other family member; Hypertension in an other family member. No Known Allergies Current Outpatient Medications on File Prior to Visit  Medication Sig Dispense  Refill  . aspirin 81 MG tablet Take 81 mg by mouth daily.      . Glucosamine 500 MG CAPS Take 500 mg by mouth daily.     . hydrochlorothiazide (MICROZIDE) 12.5 MG capsule Take 1 capsule (12.5 mg total) by mouth daily. 90 capsule 3  . losartan (COZAAR) 100 MG tablet Take 1 tablet (100 mg total) by mouth daily. 90 tablet 3  . propranolol (INDERAL) 10 MG tablet Take 1 tablet (10 mg total) by mouth 2 (two) times daily. 180 tablet 3   No current facility-administered medications on file prior to visit.   Review of Systems All otherwise neg per pt     Objective:   Physical Exam BP 128/74   Pulse 64   Temp 98.2 F (36.8 C)   Ht 5\' 8"  (1.727 m)   Wt 203 lb 6.4 oz (92.3 kg)   SpO2 98%   BMI 30.93 kg/m  VS noted,  Constitutional: Pt appears in NAD HENT: Head: NCAT.  Right Ear: External ear normal.  Left Ear: External ear normal.  Eyes: . Pupils are equal, round, and reactive to light. Conjunctivae and EOM are normal Nose: without d/c or deformity Neck: Neck supple. Gross normal ROM Cardiovascular: Normal rate and regular rhythm.   Pulmonary/Chest: Effort normal and breath sounds without rales or wheezing.  Abd:  Soft, NT, ND, + BS, no organomegaly Neurological: Pt is alert. At baseline orientation, motor grossly intact Skin: Skin is warm. No rashes, other new lesions, no LE edema Psychiatric: Pt behavior is  normal without agitation  All otherwise neg per pt Lab Results  Component Value Date   WBC 11.8 (H) 09/15/2018   HGB 14.9 09/15/2018   HCT 44.9 09/15/2018   PLT 287.0 09/15/2018   GLUCOSE 127 (H) 03/16/2019   CHOL 184 03/16/2019   TRIG 101.0 03/16/2019   HDL 54.30 03/16/2019   LDLDIRECT 116.7 05/28/2010   LDLCALC 110 (H) 03/16/2019   ALT 14 03/16/2019   AST 14 03/16/2019   NA 134 (L) 03/16/2019   K 3.5 03/16/2019   CL 96 03/16/2019   CREATININE 0.93 03/16/2019   BUN 16 03/16/2019   CO2 30 03/16/2019   TSH 3.81 09/15/2018   PSA 2.14 09/15/2018   INR 0.93  11/28/2013   HGBA1C 7.6 (H) 03/16/2019   MICROALBUR <0.7 09/15/2018       Assessment & Plan:

## 2019-09-20 NOTE — Assessment & Plan Note (Signed)
stable overall by history and exam, recent data reviewed with pt, and pt to continue medical treatment as before,  to f/u any worsening symptoms or concerns  

## 2019-09-22 LAB — HEMOGLOBIN A1C: Hgb A1c MFr Bld: 7.9 % — ABNORMAL HIGH (ref 4.6–6.5)

## 2019-09-24 ENCOUNTER — Other Ambulatory Visit: Payer: Self-pay | Admitting: Internal Medicine

## 2019-09-24 MED ORDER — PRAVASTATIN SODIUM 40 MG PO TABS: 40.0000 mg | ORAL_TABLET | Freq: Every day | ORAL | 3 refills | Status: DC

## 2019-09-24 MED ORDER — METFORMIN HCL ER 500 MG PO TB24: 1500.0000 mg | ORAL_TABLET | Freq: Every day | ORAL | 3 refills | Status: DC

## 2019-09-27 ENCOUNTER — Telehealth: Payer: Self-pay | Admitting: Internal Medicine

## 2019-09-27 MED ORDER — PRAVASTATIN SODIUM 40 MG PO TABS: 40.0000 mg | ORAL_TABLET | Freq: Every day | ORAL | 3 refills | Status: DC

## 2019-09-27 NOTE — Telephone Encounter (Signed)
New message: ° ° ° ° ° °Pt's wife is returning a call for lab results. °

## 2019-09-27 NOTE — Telephone Encounter (Signed)
Called spouse and pt back and reviewed the new medications. Informed to take 1 tablet three times a day with food. Informed that the pravastatin was increased, however the pharmacy dispensed the lower dose. Informed pt to take 2 until gone and resent the erx and requested the pharmacy to Discontinue all previous refills for this medication. Hold/Fill when appropriate according to pt rx fill schedule.

## 2020-04-12 ENCOUNTER — Other Ambulatory Visit: Payer: Self-pay | Admitting: Internal Medicine

## 2020-04-12 MED ORDER — PROPRANOLOL HCL 10 MG PO TABS
10.0000 mg | ORAL_TABLET | Freq: Two times a day (BID) | ORAL | 1 refills | Status: DC
Start: 2020-04-12 — End: 2020-09-27

## 2020-04-12 MED ORDER — HYDROCHLOROTHIAZIDE 12.5 MG PO CAPS: 12.5000 mg | ORAL_CAPSULE | Freq: Every day | ORAL | 1 refills | Status: DC

## 2020-04-12 MED ORDER — PRAVASTATIN SODIUM 40 MG PO TABS
40.0000 mg | ORAL_TABLET | Freq: Every day | ORAL | 1 refills | Status: DC
Start: 2020-04-12 — End: 2020-11-22

## 2020-04-12 NOTE — Telephone Encounter (Signed)
hydrochlorothiazide (MICROZIDE) 12.5 MG capsule  propranolol (INDERAL) 10 MG tablet losartan (COZAAR) 100 MG tablet  [facility] - [place], [place] - [address] Phone:  [phone]  Fax:  [phone]     Last appt: 2.23.21  Next appt: not scheduled

## 2020-04-24 ENCOUNTER — Other Ambulatory Visit: Payer: Self-pay | Admitting: Internal Medicine

## 2020-04-24 NOTE — Telephone Encounter (Signed)
Please refill as per office routine med refill policy (all routine meds refilled for 3 mo or monthly per pt preference up to one year from last visit, then month to month grace period for 3 mo, then further med refills will have to be denied)  

## 2020-06-14 ENCOUNTER — Ambulatory Visit (INDEPENDENT_AMBULATORY_CARE_PROVIDER_SITE_OTHER): Payer: Medicare HMO | Admitting: Internal Medicine

## 2020-06-14 ENCOUNTER — Other Ambulatory Visit: Payer: Self-pay

## 2020-06-14 ENCOUNTER — Encounter: Payer: Self-pay | Admitting: Internal Medicine

## 2020-06-14 VITALS — BP 170/60 | HR 76 | Temp 98.2°F | Ht 68.0 in | Wt 199.0 lb

## 2020-06-14 DIAGNOSIS — I10 Essential (primary) hypertension: Secondary | ICD-10-CM

## 2020-06-14 DIAGNOSIS — E785 Hyperlipidemia, unspecified: Secondary | ICD-10-CM | POA: Diagnosis not present

## 2020-06-14 DIAGNOSIS — Z1159 Encounter for screening for other viral diseases: Secondary | ICD-10-CM | POA: Diagnosis not present

## 2020-06-14 DIAGNOSIS — E039 Hypothyroidism, unspecified: Secondary | ICD-10-CM | POA: Diagnosis not present

## 2020-06-14 DIAGNOSIS — Z23 Encounter for immunization: Secondary | ICD-10-CM

## 2020-06-14 DIAGNOSIS — E1165 Type 2 diabetes mellitus with hyperglycemia: Secondary | ICD-10-CM | POA: Diagnosis not present

## 2020-06-14 NOTE — Patient Instructions (Addendum)
Please check your BP at home daily for 10 days and call with results  Please remember to call for your yearly eye exam  Please continue all other medications as before, and refills have been done if requested.  Please have the pharmacy call with any other refills you may need.  Please continue your efforts at being more active, low cholesterol diet, and weight control.  You are otherwise up to date with prevention measures today.  Please keep your appointments with your specialists as you may have planned  Please go to the LAB at the blood drawing area for the tests to be done - tomorrow  You will be contacted by phone if any changes need to be made immediately.  Otherwise, you will receive a letter about your results with an explanation, but please check with MyChart first.  Please remember to sign up for MyChart if you have not done so, as this will be important to you in the future with finding out test results, communicating by private email, and scheduling acute appointments online when needed.  Please make an Appointment to return in 6 months, or sooner if needed

## 2020-06-14 NOTE — Progress Notes (Signed)
Subjective:    Patient ID: Vincent Campbell, male    DOB: July 25, 1941, 79 y.o.   MRN: [id number]  HPI  Here to f/u with wife as his hearing is much worse; overall doing ok,  Pt denies chest pain, increasing sob or doe, wheezing, orthopnea, PND, increased LE swelling, palpitations, dizziness or syncope.  Pt denies new neurological symptoms such as new headache, or facial or extremity weakness or numbness.  Pt denies polydipsia, polyuria, or low sugar episode.  Pt states overall good compliance with meds, mostly trying to follow appropriate diet, with wt overall stable, Not sure if hes going to get the booster.  Did have fall with skin tear to left elbow but now scabbed and healing well.  Also had some sympathetic left lateral cp soreness for several days, mild pleuritic, now resolved.  Denies hyper or hypo thyroid symptoms such as voice, skin or hair change. Past Medical History:  Diagnosis Date  . BACK PAIN 01/12/2008  . COLONIC POLYPS, HX OF 01/12/2008  . Cough 05/28/2010  . Encounter for well adult exam with abnormal findings 12/29/2010  . HYPERLIPIDEMIA 01/12/2008  . HYPERTENSION 01/12/2008  . Hypothyroidism 09/15/2018  . LEUKOCYTOSIS 03/02/2008  . PERIPHERAL EDEMA 06/28/2010  . SWELLING MASS OR LUMP IN HEAD AND NECK 05/28/2010  . Tremor 08/02/2012   Past Surgical History:  Procedure Laterality Date  . APPENDECTOMY    . CATARACT EXTRACTION    . CHOLECYSTECTOMY      reports that he has been smoking. He has never used smokeless tobacco. He reports current alcohol use. He reports that he does not use drugs. family history includes Cancer in an other family member; Heart disease in an other family member; Hypertension in an other family member. No Known Allergies Current Outpatient Medications on File Prior to Visit  Medication Sig Dispense Refill  . aspirin 81 MG tablet Take 81 mg by mouth daily.      . Glucosamine 500 MG CAPS Take 500 mg by mouth daily.     . hydrochlorothiazide (MICROZIDE) 12.5  MG capsule Take 1 capsule (12.5 mg total) by mouth daily. 90 capsule 1  . levothyroxine (SYNTHROID) 25 MCG tablet TAKE 1 TABLET BY MOUTH ONCE DAILY BEFORE BREAKFAST 90 tablet 3  . losartan (COZAAR) 100 MG tablet Take 1 tablet by mouth once daily 90 tablet 0  . metFORMIN (GLUCOPHAGE-XR) 500 MG 24 hr tablet Take 3 tablets (1,500 mg total) by mouth daily with breakfast. 270 tablet 3  . pravastatin (PRAVACHOL) 40 MG tablet Take 1 tablet (40 mg total) by mouth daily. 90 tablet 1  . propranolol (INDERAL) 10 MG tablet Take 1 tablet (10 mg total) by mouth 2 (two) times daily. 180 tablet 1   No current facility-administered medications on file prior to visit.   Review of Systems All otherwise neg per pt    Objective:   Physical Exam BP (!) 170/60 (BP Location: Left Arm, Patient Position: Sitting, Cuff Size: Large)   Pulse 76   Temp 98.2 F (36.8 C) (Oral)   Ht 5\' 8"  (1.727 m)   Wt 199 lb (90.3 kg)   SpO2 95%   BMI 30.26 kg/m  VS noted,  Constitutional: Pt appears in NAD HENT: Head: NCAT.  Right Ear: External ear normal.  Left Ear: External ear normal.  Eyes: . Pupils are equal, round, and reactive to light. Conjunctivae and EOM are normal Nose: without d/c or deformity Neck: Neck supple. Gross normal ROM Cardiovascular: Normal rate and  regular rhythm.   Pulmonary/Chest: Effort normal and breath sounds without rales or wheezing.  Abd:  Soft, NT, ND, + BS, no organomegaly Neurological: Pt is alert. At baseline orientation, motor grossly intact Skin: Skin is warm. No rashes, other new lesions, no LE edema Psychiatric: Pt behavior is normal without agitation  All otherwise neg per pt Lab Results  Component Value Date   WBC 11.7 (H) 09/20/2019   HGB 15.3 09/20/2019   HCT 45.5 09/20/2019   PLT 291.0 09/20/2019   GLUCOSE 150 (H) 09/20/2019   CHOL 202 (H) 09/20/2019   TRIG 95.0 09/20/2019   HDL 54.80 09/20/2019   LDLDIRECT 116.7 05/28/2010   LDLCALC 129 (H) 09/20/2019   ALT 14  09/20/2019   AST 16 09/20/2019   NA 134 (L) 09/20/2019   K 3.9 09/20/2019   CL 95 (L) 09/20/2019   CREATININE 1.06 09/20/2019   BUN 19 09/20/2019   CO2 29 09/20/2019   TSH 3.04 09/20/2019   PSA 2.70 09/20/2019   INR 0.93 11/28/2013   HGBA1C 7.9 (H) 09/20/2019   MICROALBUR <0.7 09/20/2019      Assessment & Plan:

## 2020-06-16 ENCOUNTER — Encounter: Payer: Self-pay | Admitting: Internal Medicine

## 2020-06-16 NOTE — Assessment & Plan Note (Signed)
?   Recent control, to check bp at home for 10 days and call with average

## 2020-06-16 NOTE — Assessment & Plan Note (Signed)
stable overall by history and exam, recent data reviewed with pt, and pt to continue medical treatment as before,  to f/u any worsening symptoms or concerns  

## 2020-06-16 NOTE — Assessment & Plan Note (Addendum)

## 2020-06-18 ENCOUNTER — Other Ambulatory Visit: Payer: Self-pay

## 2020-06-18 ENCOUNTER — Encounter: Payer: Self-pay | Admitting: Internal Medicine

## 2020-06-18 ENCOUNTER — Other Ambulatory Visit (INDEPENDENT_AMBULATORY_CARE_PROVIDER_SITE_OTHER): Payer: Medicare HMO

## 2020-06-18 DIAGNOSIS — E1165 Type 2 diabetes mellitus with hyperglycemia: Secondary | ICD-10-CM | POA: Diagnosis not present

## 2020-06-18 DIAGNOSIS — Z1159 Encounter for screening for other viral diseases: Secondary | ICD-10-CM | POA: Diagnosis not present

## 2020-06-18 DIAGNOSIS — E039 Hypothyroidism, unspecified: Secondary | ICD-10-CM

## 2020-06-18 LAB — BASIC METABOLIC PANEL
BUN: 14 mg/dL (ref 6–23)
CO2: 28 mEq/L (ref 19–32)
Calcium: 8.9 mg/dL (ref 8.4–10.5)
Chloride: 98 mEq/L (ref 96–112)
Creatinine, Ser: 1.07 mg/dL (ref 0.40–1.50)
GFR: 66.08 mL/min (ref >60.00)
Glucose, Bld: 149 mg/dL — ABNORMAL HIGH (ref 70–99)
Potassium: 4.2 mEq/L (ref 3.5–5.1)
Sodium: 134 mEq/L — ABNORMAL LOW (ref 135–145)

## 2020-06-18 LAB — HEPATIC FUNCTION PANEL
ALT: 14 U/L (ref 0–53)
AST: 16 U/L (ref 0–37)
Albumin: 3.8 g/dL (ref 3.5–5.2)
Alkaline Phosphatase: 82 U/L (ref 39–117)
Bilirubin, Direct: 0.2 mg/dL (ref 0.0–0.3)
Total Bilirubin: 0.7 mg/dL (ref 0.2–1.2)
Total Protein: 6.9 g/dL (ref 6.0–8.3)

## 2020-06-18 LAB — LIPID PANEL
Cholesterol: 134 mg/dL (ref 0–200)
HDL: 54.7 mg/dL (ref >39.00)
LDL Cholesterol: 63 mg/dL (ref 0–99)
NonHDL: 79.15
Total CHOL/HDL Ratio: 2
Triglycerides: 83 mg/dL (ref 0.0–149.0)
VLDL: 16.6 mg/dL (ref 0.0–40.0)

## 2020-06-18 LAB — HEMOGLOBIN A1C: Hgb A1c MFr Bld: 7.2 % — ABNORMAL HIGH (ref 4.6–6.5)

## 2020-06-18 LAB — TSH: TSH: 3.38 u[IU]/mL (ref 0.35–4.50)

## 2020-06-18 NOTE — Addendum Note (Signed)
Addended by: Miguel Aschoff on: 06/18/2020 10:22 AM   Modules accepted: Orders

## 2020-06-19 LAB — HEPATITIS C ANTIBODY
Hepatitis C Ab: NONREACTIVE
SIGNAL TO CUT-OFF: 0.03 (ref <1.00)

## 2020-07-02 ENCOUNTER — Ambulatory Visit: Payer: Medicare HMO

## 2020-07-17 ENCOUNTER — Ambulatory Visit: Payer: Medicare HMO

## 2020-07-19 ENCOUNTER — Telehealth: Payer: Self-pay | Admitting: Internal Medicine

## 2020-07-19 NOTE — Telephone Encounter (Signed)
Please refill as per office routine med refill policy (all routine meds refilled for 3 mo or monthly per pt preference up to one year from last visit, then month to month grace period for 3 mo, then further med refills will have to be denied)  

## 2020-07-26 NOTE — Telephone Encounter (Signed)
   Losartan 100mg  on back order They do have enough of the 25mg  tablet Please call pharmacy to request 30 day supply, Losartan 4x daily or new order for alternative medication

## 2020-07-30 MED ORDER — LOSARTAN POTASSIUM 25 MG PO TABS: 100.0000 mg | ORAL_TABLET | Freq: Every day | ORAL | 0 refills | Status: DC

## 2020-07-30 NOTE — Telephone Encounter (Signed)
Sent to Dr. John. 

## 2020-07-30 NOTE — Addendum Note (Signed)
Addended by: Corwin Levins on: 07/30/2020 12:57 PM   Modules accepted: Orders

## 2020-07-30 NOTE — Telephone Encounter (Signed)
Ok this is done 

## 2020-09-13 ENCOUNTER — Telehealth: Payer: Self-pay | Admitting: Internal Medicine

## 2020-09-13 NOTE — Telephone Encounter (Signed)
LVM fpr pt to rtn my call to schedule awv with nha. Please schedule this appt if pt calls the office.

## 2020-09-19 ENCOUNTER — Other Ambulatory Visit: Payer: Self-pay | Admitting: Internal Medicine

## 2020-09-19 DIAGNOSIS — Z974 Presence of external hearing-aid: Secondary | ICD-10-CM | POA: Diagnosis not present

## 2020-09-19 DIAGNOSIS — H9193 Unspecified hearing loss, bilateral: Secondary | ICD-10-CM | POA: Diagnosis not present

## 2020-09-19 DIAGNOSIS — H6123 Impacted cerumen, bilateral: Secondary | ICD-10-CM | POA: Insufficient documentation

## 2020-09-19 NOTE — Telephone Encounter (Signed)
Please refill as per office routine med refill policy (all routine meds refilled for 3 mo or monthly per pt preference up to one year from last visit, then month to month grace period for 3 mo, then further med refills will have to be denied)  

## 2020-09-20 ENCOUNTER — Other Ambulatory Visit: Payer: Self-pay

## 2020-09-20 ENCOUNTER — Emergency Department (HOSPITAL_COMMUNITY): Payer: Medicare Other

## 2020-09-20 ENCOUNTER — Encounter (HOSPITAL_COMMUNITY): Payer: Self-pay

## 2020-09-20 ENCOUNTER — Inpatient Hospital Stay (HOSPITAL_COMMUNITY)
Admission: EM | Admit: 2020-09-20 | Discharge: 2020-09-27 | DRG: 034 | Disposition: A | Discharge status: Home Health | Payer: Medicare Other | Attending: Internal Medicine | Admitting: Internal Medicine

## 2020-09-20 DIAGNOSIS — I444 Left anterior fascicular block: Secondary | ICD-10-CM | POA: Diagnosis present

## 2020-09-20 DIAGNOSIS — N182 Chronic kidney disease, stage 2 (mild): Secondary | ICD-10-CM | POA: Diagnosis present

## 2020-09-20 DIAGNOSIS — N179 Acute kidney failure, unspecified: Secondary | ICD-10-CM | POA: Diagnosis not present

## 2020-09-20 DIAGNOSIS — R404 Transient alteration of awareness: Secondary | ICD-10-CM | POA: Diagnosis not present

## 2020-09-20 DIAGNOSIS — J189 Pneumonia, unspecified organism: Secondary | ICD-10-CM | POA: Diagnosis present

## 2020-09-20 DIAGNOSIS — Z781 Physical restraint status: Secondary | ICD-10-CM

## 2020-09-20 DIAGNOSIS — R262 Difficulty in walking, not elsewhere classified: Secondary | ICD-10-CM | POA: Diagnosis present

## 2020-09-20 DIAGNOSIS — Z7982 Long term (current) use of aspirin: Secondary | ICD-10-CM

## 2020-09-20 DIAGNOSIS — I129 Hypertensive chronic kidney disease with stage 1 through stage 4 chronic kidney disease, or unspecified chronic kidney disease: Secondary | ICD-10-CM | POA: Diagnosis not present

## 2020-09-20 DIAGNOSIS — R471 Dysarthria and anarthria: Secondary | ICD-10-CM | POA: Diagnosis present

## 2020-09-20 DIAGNOSIS — I10 Essential (primary) hypertension: Secondary | ICD-10-CM | POA: Diagnosis present

## 2020-09-20 DIAGNOSIS — E1165 Type 2 diabetes mellitus with hyperglycemia: Secondary | ICD-10-CM | POA: Diagnosis present

## 2020-09-20 DIAGNOSIS — Z7984 Long term (current) use of oral hypoglycemic drugs: Secondary | ICD-10-CM

## 2020-09-20 DIAGNOSIS — F1010 Alcohol abuse, uncomplicated: Secondary | ICD-10-CM | POA: Diagnosis present

## 2020-09-20 DIAGNOSIS — Z800 Family history of malignant neoplasm of digestive organs: Secondary | ICD-10-CM

## 2020-09-20 DIAGNOSIS — I6521 Occlusion and stenosis of right carotid artery: Secondary | ICD-10-CM | POA: Diagnosis present

## 2020-09-20 DIAGNOSIS — R17 Unspecified jaundice: Secondary | ICD-10-CM | POA: Diagnosis not present

## 2020-09-20 DIAGNOSIS — G8324 Monoplegia of upper limb affecting left nondominant side: Secondary | ICD-10-CM | POA: Diagnosis present

## 2020-09-20 DIAGNOSIS — I6330 Cerebral infarction due to thrombosis of unspecified cerebral artery: Secondary | ICD-10-CM

## 2020-09-20 DIAGNOSIS — F05 Delirium due to known physiological condition: Secondary | ICD-10-CM | POA: Diagnosis present

## 2020-09-20 DIAGNOSIS — H9190 Unspecified hearing loss, unspecified ear: Secondary | ICD-10-CM | POA: Diagnosis present

## 2020-09-20 DIAGNOSIS — I639 Cerebral infarction, unspecified: Secondary | ICD-10-CM | POA: Diagnosis not present

## 2020-09-20 DIAGNOSIS — Z79890 Hormone replacement therapy (postmenopausal): Secondary | ICD-10-CM

## 2020-09-20 DIAGNOSIS — Y900 Blood alcohol level of less than 20 mg/100 ml: Secondary | ICD-10-CM | POA: Diagnosis present

## 2020-09-20 DIAGNOSIS — G9340 Encephalopathy, unspecified: Secondary | ICD-10-CM | POA: Diagnosis present

## 2020-09-20 DIAGNOSIS — Z79899 Other long term (current) drug therapy: Secondary | ICD-10-CM

## 2020-09-20 DIAGNOSIS — R0902 Hypoxemia: Secondary | ICD-10-CM | POA: Diagnosis not present

## 2020-09-20 DIAGNOSIS — Z743 Need for continuous supervision: Secondary | ICD-10-CM | POA: Diagnosis not present

## 2020-09-20 DIAGNOSIS — Z8249 Family history of ischemic heart disease and other diseases of the circulatory system: Secondary | ICD-10-CM

## 2020-09-20 DIAGNOSIS — E669 Obesity, unspecified: Secondary | ICD-10-CM | POA: Diagnosis present

## 2020-09-20 DIAGNOSIS — F0390 Unspecified dementia without behavioral disturbance: Secondary | ICD-10-CM | POA: Diagnosis present

## 2020-09-20 DIAGNOSIS — I6389 Other cerebral infarction: Secondary | ICD-10-CM | POA: Diagnosis not present

## 2020-09-20 DIAGNOSIS — R410 Disorientation, unspecified: Secondary | ICD-10-CM | POA: Diagnosis not present

## 2020-09-20 DIAGNOSIS — E039 Hypothyroidism, unspecified: Secondary | ICD-10-CM | POA: Diagnosis present

## 2020-09-20 DIAGNOSIS — R29701 NIHSS score 1: Secondary | ICD-10-CM | POA: Diagnosis not present

## 2020-09-20 DIAGNOSIS — Z789 Other specified health status: Secondary | ICD-10-CM

## 2020-09-20 DIAGNOSIS — E785 Hyperlipidemia, unspecified: Secondary | ICD-10-CM | POA: Diagnosis present

## 2020-09-20 DIAGNOSIS — R0602 Shortness of breath: Secondary | ICD-10-CM

## 2020-09-20 DIAGNOSIS — I6340 Cerebral infarction due to embolism of unspecified cerebral artery: Secondary | ICD-10-CM | POA: Diagnosis present

## 2020-09-20 DIAGNOSIS — R531 Weakness: Secondary | ICD-10-CM | POA: Diagnosis not present

## 2020-09-20 DIAGNOSIS — E876 Hypokalemia: Secondary | ICD-10-CM | POA: Diagnosis not present

## 2020-09-20 DIAGNOSIS — Z20822 Contact with and (suspected) exposure to covid-19: Secondary | ICD-10-CM | POA: Diagnosis not present

## 2020-09-20 DIAGNOSIS — G9341 Metabolic encephalopathy: Secondary | ICD-10-CM | POA: Diagnosis not present

## 2020-09-20 DIAGNOSIS — Z87891 Personal history of nicotine dependence: Secondary | ICD-10-CM

## 2020-09-20 DIAGNOSIS — Z6830 Body mass index (BMI) 30.0-30.9, adult: Secondary | ICD-10-CM | POA: Diagnosis not present

## 2020-09-20 DIAGNOSIS — I63231 Cerebral infarction due to unspecified occlusion or stenosis of right carotid arteries: Secondary | ICD-10-CM | POA: Diagnosis not present

## 2020-09-20 DIAGNOSIS — D72829 Elevated white blood cell count, unspecified: Secondary | ICD-10-CM | POA: Diagnosis not present

## 2020-09-20 DIAGNOSIS — Z7289 Other problems related to lifestyle: Secondary | ICD-10-CM | POA: Diagnosis not present

## 2020-09-20 DIAGNOSIS — E1122 Type 2 diabetes mellitus with diabetic chronic kidney disease: Secondary | ICD-10-CM

## 2020-09-20 DIAGNOSIS — E119 Type 2 diabetes mellitus without complications: Secondary | ICD-10-CM

## 2020-09-20 LAB — COMPREHENSIVE METABOLIC PANEL
ALT: 19 U/L (ref 0–44)
AST: 23 U/L (ref 15–41)
Albumin: 4.2 g/dL (ref 3.5–5.0)
Alkaline Phosphatase: 53 U/L (ref 38–126)
Anion gap: 11 (ref 5–15)
BUN: 18 mg/dL (ref 8–23)
CO2: 25 mmol/L (ref 22–32)
Calcium: 9.2 mg/dL (ref 8.9–10.3)
Chloride: 101 mmol/L (ref 98–111)
Creatinine, Ser: 1.23 mg/dL (ref 0.61–1.24)
GFR, Estimated: 60 mL/min — ABNORMAL LOW (ref >60)
Glucose, Bld: 137 mg/dL — ABNORMAL HIGH (ref 70–99)
Potassium: 3.6 mmol/L (ref 3.5–5.1)
Sodium: 137 mmol/L (ref 135–145)
Total Bilirubin: 1.2 mg/dL (ref 0.3–1.2)
Total Protein: 7.8 g/dL (ref 6.5–8.1)

## 2020-09-20 LAB — CBC WITH DIFFERENTIAL/PLATELET
Abs Immature Granulocytes: 0.08 10*3/uL — ABNORMAL HIGH (ref 0.00–0.07)
Basophils Absolute: 0.1 10*3/uL (ref 0.0–0.1)
Basophils Relative: 0 %
Eosinophils Absolute: 0.2 10*3/uL (ref 0.0–0.5)
Eosinophils Relative: 1 %
HCT: 41.8 % (ref 39.0–52.0)
Hemoglobin: 13.9 g/dL (ref 13.0–17.0)
Immature Granulocytes: 1 %
Lymphocytes Relative: 12 %
Lymphs Abs: 1.9 10*3/uL (ref 0.7–4.0)
MCH: 32.9 pg (ref 26.0–34.0)
MCHC: 33.3 g/dL (ref 30.0–36.0)
MCV: 99.1 fL (ref 80.0–100.0)
Monocytes Absolute: 2.1 10*3/uL — ABNORMAL HIGH (ref 0.1–1.0)
Monocytes Relative: 13 %
Neutro Abs: 11.6 10*3/uL — ABNORMAL HIGH (ref 1.7–7.7)
Neutrophils Relative %: 73 %
Platelets: 260 10*3/uL (ref 150–400)
RBC: 4.22 MIL/uL (ref 4.22–5.81)
RDW: 13.9 % (ref 11.5–15.5)
WBC: 15.9 10*3/uL — ABNORMAL HIGH (ref 4.0–10.5)
nRBC: 0 % (ref 0.0–0.2)

## 2020-09-20 LAB — PROTIME-INR
INR: 1.3 — ABNORMAL HIGH (ref 0.8–1.2)
Prothrombin Time: 15.4 seconds — ABNORMAL HIGH (ref 11.4–15.2)

## 2020-09-20 LAB — APTT: aPTT: 46 seconds — ABNORMAL HIGH (ref 24–36)

## 2020-09-20 LAB — LACTIC ACID, PLASMA: Lactic Acid, Venous: 1.9 mmol/L (ref 0.5–1.9)

## 2020-09-20 NOTE — ED Provider Notes (Signed)
[facility]-EMERGENCY DEPT Provider Note   CSN: [id number] Arrival date & time: 09/20/20  2251     History No chief complaint on file.   Vincent Campbell is a 80 y.o. male.  Patient brought to the emergency department for confusion.  He lives independently with his wife.  Wife reports that for the last 2 days he has been weaker than usual, sleeping most of the day and has been confused.  Wife found him on the floor next to the bed tonight.  Confused but able to answer questions.  He does not remember falling.  He denies any pain.  Level 5 caveat due to altered mental status.        Past Medical History:  Diagnosis Date  . BACK PAIN 01/12/2008  . COLONIC POLYPS, HX OF 01/12/2008  . Cough 05/28/2010  . Encounter for well adult exam with abnormal findings 12/29/2010  . HYPERLIPIDEMIA 01/12/2008  . HYPERTENSION 01/12/2008  . Hypothyroidism 09/15/2018  . LEUKOCYTOSIS 03/02/2008  . PERIPHERAL EDEMA 06/28/2010  . SWELLING MASS OR LUMP IN HEAD AND NECK 05/28/2010  . Tremor 08/02/2012    Patient Active Problem List   Diagnosis Date Noted  . Acute encephalopathy 09/21/2020  . Cough 02/08/2019  . Hypothyroidism 09/15/2018  . Wellness examination 07/13/2017  . Bilateral hearing loss 06/28/2016  . Bilateral knee pain 06/28/2016  . Loss of weight 02/24/2014  . Increased prostate specific antigen (PSA) velocity 02/22/2014  . Allergic rhinitis, cause unspecified 02/22/2014  . Smoker 08/11/2013  . Dysphoric mood 08/11/2013  . Tremor 08/02/2012  . Diabetes (HCC) 01/03/2011  . PERIPHERAL EDEMA 06/28/2010  . LEUKOCYTOSIS 03/02/2008  . Hyperlipidemia 01/12/2008  . Essential hypertension 01/12/2008  . BACK PAIN 01/12/2008  . COLONIC POLYPS, HX OF 01/12/2008    Past Surgical History:  Procedure Laterality Date  . APPENDECTOMY    . CATARACT EXTRACTION    . CHOLECYSTECTOMY         Family History  Problem Relation Age of Onset  . Cancer Other        esophageal cancer   . Heart disease Other   . Hypertension Other     Social History   Tobacco Use  . Smoking status: Current Every Day Smoker  . Smokeless tobacco: Never Used  Substance Use Topics  . Alcohol use: Yes  . Drug use: No    Home Medications Prior to Admission medications   Medication Sig Start Date End Date Taking? Authorizing Provider  aspirin 81 MG tablet Take 81 mg by mouth daily.   Yes [provider]  Glucosamine 500 MG CAPS Take 500 mg by mouth daily.   Yes [provider]  hydrochlorothiazide (MICROZIDE) 12.5 MG capsule Take 1 capsule (12.5 mg total) by mouth daily. 04/12/20  Yes Corwin Levins, MD  losartan (COZAAR) 25 MG tablet Take 4 tablets (100 mg total) by mouth daily. 07/30/20  Yes Corwin Levins, MD  metFORMIN (GLUCOPHAGE-XR) 500 MG 24 hr tablet Take 3 tablets (1,500 mg total) by mouth daily with breakfast. 09/24/19  Yes Corwin Levins, MD  pravastatin (PRAVACHOL) 40 MG tablet Take 1 tablet (40 mg total) by mouth daily. 04/12/20  Yes Corwin Levins, MD  VITAMIN D PO Take 2 capsules by mouth daily.   Yes [provider]  ZINC CITRATE PO Take 1 tablet by mouth daily at 6 (six) AM.   Yes [provider]  levothyroxine (SYNTHROID) 25 MCG tablet TAKE 1 TABLET BY MOUTH  ONCE DAILY BEFORE BREAKFAST Patient not taking: No sig reported 09/20/19   Corwin Levins, MD  losartan (COZAAR) 100 MG tablet Take 1 tablet by mouth once daily Patient not taking: Reported on 09/21/2020 07/23/20   Corwin Levins, MD  propranolol (INDERAL) 10 MG tablet Take 1 tablet (10 mg total) by mouth 2 (two) times daily. Patient not taking: Reported on 09/21/2020 04/12/20   Corwin Levins, MD    Allergies    Patient has no known allergies.  Review of Systems   Review of Systems  Unable to perform ROS: Mental status change    Physical Exam Updated Vital Signs BP (!) 145/66   Pulse 82   Temp 98.1 F (36.7 C) (Oral)   Resp 12   Ht 5\' 8"  (1.727 m)   Wt 90.3 kg   SpO2 93%   BMI  30.27 kg/m   Physical Exam Vitals and nursing note reviewed.  Constitutional:      General: He is not in acute distress.    Appearance: Normal appearance. He is well-developed and well-nourished.  HENT:     Head: Normocephalic and atraumatic.     Right Ear: Hearing normal.     Left Ear: Hearing normal.     Nose: Nose normal.     Mouth/Throat:     Mouth: Oropharynx is clear and moist and mucous membranes are normal.  Eyes:     Extraocular Movements: EOM normal.     Conjunctiva/sclera: Conjunctivae normal.     Pupils: Pupils are equal, round, and reactive to light.  Cardiovascular:     Rate and Rhythm: Regular rhythm.     Heart sounds: S1 normal and S2 normal. No murmur heard. No friction rub. No gallop.   Pulmonary:     Effort: Pulmonary effort is normal. No respiratory distress.     Breath sounds: Normal breath sounds.  Chest:     Chest wall: No tenderness.  Abdominal:     General: Bowel sounds are normal.     Palpations: Abdomen is soft. There is no hepatosplenomegaly.     Tenderness: There is no abdominal tenderness. There is no guarding or rebound. Negative signs include Murphy's sign and McBurney's sign.     Hernia: No hernia is present.  Musculoskeletal:        General: Normal range of motion.     Cervical back: Normal range of motion and neck supple.     Right lower leg: Edema present.     Left lower leg: Edema present.  Skin:    General: Skin is warm, dry and intact.     Findings: No rash.     Nails: There is no cyanosis.  Neurological:     General: No focal deficit present.     Mental Status: He is alert. He is disoriented.     GCS: GCS eye subscore is 4. GCS verbal subscore is 5. GCS motor subscore is 6.     Cranial Nerves: No cranial nerve deficit.     Sensory: No sensory deficit.     Coordination: Coordination normal.     Deep Tendon Reflexes: Strength normal.  Psychiatric:        Mood and Affect: Mood and affect normal.        Speech: Speech is delayed.      ED Results / Procedures / Treatments   Labs (all labs ordered are listed, but only abnormal results are displayed) Labs Reviewed  COMPREHENSIVE METABOLIC PANEL - Abnormal; Notable  for the following components:      Result Value   Glucose, Bld 137 (*)    GFR, Estimated 60 (*)    All other components within normal limits  CBC WITH DIFFERENTIAL/PLATELET - Abnormal; Notable for the following components:   WBC 15.9 (*)    Neutro Abs 11.6 (*)    Monocytes Absolute 2.1 (*)    Abs Immature Granulocytes 0.08 (*)    All other components within normal limits  PROTIME-INR - Abnormal; Notable for the following components:   Prothrombin Time 15.4 (*)    INR 1.3 (*)    All other components within normal limits  APTT - Abnormal; Notable for the following components:   aPTT 46 (*)    All other components within normal limits  URINALYSIS, ROUTINE W REFLEX MICROSCOPIC - Abnormal; Notable for the following components:   Hgb urine dipstick SMALL (*)    Ketones, ur 20 (*)    All other components within normal limits  RESP PANEL BY RT-PCR (FLU A&B, COVID) ARPGX2  CULTURE, BLOOD (SINGLE)  URINE CULTURE  LACTIC ACID, PLASMA  LACTIC ACID, PLASMA    EKG EKG Interpretation  Date/Time:  Thursday September 20 2020 23:32:57 EST Ventricular Rate:  81 PR Interval:    QRS Duration: 103 QT Interval:  370 QTC Calculation: 430 R Axis:   -72 Text Interpretation: Ectopic atrial rhythm Atrial premature complex Left anterior fascicular block Abnormal R-wave progression, early transition Confirmed by Gilda Crease [phone]) on 09/20/2020 11:48:14 PM   Radiology CT HEAD WO CONTRAST  Result Date: 09/21/2020 CLINICAL DATA:  Increasing weakness and altered mental status, status post fall. EXAM: CT HEAD WITHOUT CONTRAST TECHNIQUE: Contiguous axial images were obtained from the base of the skull through the vertex without intravenous contrast. COMPARISON:  May 16, 2006 FINDINGS: Brain: There is  mild cerebral atrophy with widening of the extra-axial spaces and ventricular dilatation. There are areas of decreased attenuation within the white matter tracts of the supratentorial brain, consistent with microvascular disease changes. Vascular: No hyperdense vessel or unexpected calcification. Skull: Normal. Negative for fracture or focal lesion. Sinuses/Orbits: No acute finding. Other: None. IMPRESSION: 1. Generalized cerebral atrophy. 2. No acute intracranial abnormality. Electronically Signed   By: Aram Candela M.D.   On: 09/21/2020 01:35   CT CERVICAL SPINE WO CONTRAST  Result Date: 09/21/2020 CLINICAL DATA:  Status post fall. EXAM: CT CERVICAL SPINE WITHOUT CONTRAST TECHNIQUE: Multidetector CT imaging of the cervical spine was performed without intravenous contrast. Multiplanar CT image reconstructions were also generated. COMPARISON:  None. FINDINGS: Alignment: Normal. Skull base and vertebrae: No acute fracture. No primary bone lesion or focal pathologic process. Soft tissues and spinal canal: No prevertebral fluid or swelling. No visible canal hematoma. Disc levels: Moderate severity endplate sclerosis is seen at the levels of C5-C6 and C6-C7. Moderate to marked severity intervertebral disc space narrowing is also seen at the levels of C5-C6 and C6-C7. Bilateral moderate severity multilevel facet joint hypertrophy is noted. Upper chest: Negative. Other: None. IMPRESSION: 1. Moderate to marked severity degenerative changes at the levels of C5-C6 and C6-C7. 2. No evidence of an acute fracture or subluxation. Electronically Signed   By: Aram Candela M.D.   On: 09/21/2020 01:38   DG Chest Port 1 View  Result Date: 09/20/2020 CLINICAL DATA:  Weakness and confusion. EXAM: PORTABLE CHEST 1 VIEW COMPARISON:  February 22, 2014 FINDINGS: Mild areas of atelectasis and/or infiltrate are seen within the bilateral lung bases. There is no evidence  of a pleural effusion or pneumothorax. The heart size and  mediastinal contours are within normal limits. The visualized skeletal structures are unremarkable. IMPRESSION: Mild bibasilar atelectasis and/or infiltrate. Electronically Signed   By: Aram Candelahaddeus  Houston M.D.   On: 09/20/2020 23:38    Procedures Procedures   Medications Ordered in ED Medications  cefTRIAXone (ROCEPHIN) 1 g in sodium chloride 0.9 % 100 mL IVPB (has no administration in time range)  azithromycin (ZITHROMAX) 500 mg in sodium chloride 0.9 % 250 mL IVPB (has no administration in time range)    ED Course  I have reviewed the triage vital signs and the nursing notes.  Pertinent labs & imaging results that were available during my care of the patient were reviewed by me and considered in my medical decision making (see chart for details).    MDM Rules/Calculators/A&P                          Patient presents to the emergency department after a fall.  Patient's wife is now present in the department and gives additional information.  He woke up this morning and seemed altered.  He forgot how to use the microwave and how to change the channels on the TV.  He seemed to be wandering around the department staring at things, not comprehending.  This is a change for him today.  EMS reported a fever prehospital, but rectal temp at arrival was normal.  He does have a leukocytosis, remainder of blood work unremarkable.  Urinalysis does not suggest infection.  Patient is not in respiratory distress but his oxygen saturations are dropping, now 91 to 92% on room air.  He does not use oxygen normally.  Chest x-ray with bibasilar infiltrates.  Wife reports he seems to be clearing his throat a lot and I wonder if he is potentially aspirating.  He had a fall tonight at home and she was unable to get him back up.  He is agitated, confused and cannot sit himself up in the bed.  I doubt he could walk, have not attempted because I do not want him to fall again.  Will empirically cover for pneumonia,  hospitalist to evaluate for observation.  Final Clinical Impression(s) / ED Diagnoses Final diagnoses:  Encephalopathy acute  Community acquired pneumonia, unspecified laterality    Rx / DC Orders ED Discharge Orders    None       Dyke Weible, Canary Brimhristopher J, MD 09/21/20 [phone]

## 2020-09-20 NOTE — ED Notes (Signed)
Pt. Condom cath placed on patient for urine specimen.

## 2020-09-20 NOTE — ED Triage Notes (Signed)
From home. Lives with wife, wife reports pt feeling unwell for 2 days, lethargy. Wife found him on the floor tonight and reports increased weakness and AMS.

## 2020-09-21 ENCOUNTER — Emergency Department (HOSPITAL_COMMUNITY): Payer: Medicare Other

## 2020-09-21 ENCOUNTER — Encounter (HOSPITAL_COMMUNITY): Payer: Self-pay | Admitting: Family Medicine

## 2020-09-21 DIAGNOSIS — I6340 Cerebral infarction due to embolism of unspecified cerebral artery: Secondary | ICD-10-CM | POA: Diagnosis present

## 2020-09-21 DIAGNOSIS — R471 Dysarthria and anarthria: Secondary | ICD-10-CM | POA: Diagnosis present

## 2020-09-21 DIAGNOSIS — E1122 Type 2 diabetes mellitus with diabetic chronic kidney disease: Secondary | ICD-10-CM

## 2020-09-21 DIAGNOSIS — E785 Hyperlipidemia, unspecified: Secondary | ICD-10-CM | POA: Diagnosis present

## 2020-09-21 DIAGNOSIS — Z7289 Other problems related to lifestyle: Secondary | ICD-10-CM

## 2020-09-21 DIAGNOSIS — J189 Pneumonia, unspecified organism: Secondary | ICD-10-CM | POA: Insufficient documentation

## 2020-09-21 DIAGNOSIS — F0390 Unspecified dementia without behavioral disturbance: Secondary | ICD-10-CM | POA: Diagnosis present

## 2020-09-21 DIAGNOSIS — I6389 Other cerebral infarction: Secondary | ICD-10-CM | POA: Diagnosis not present

## 2020-09-21 DIAGNOSIS — I639 Cerebral infarction, unspecified: Secondary | ICD-10-CM | POA: Diagnosis not present

## 2020-09-21 DIAGNOSIS — F1010 Alcohol abuse, uncomplicated: Secondary | ICD-10-CM | POA: Diagnosis present

## 2020-09-21 DIAGNOSIS — I10 Essential (primary) hypertension: Secondary | ICD-10-CM | POA: Diagnosis not present

## 2020-09-21 DIAGNOSIS — Z20822 Contact with and (suspected) exposure to covid-19: Secondary | ICD-10-CM | POA: Diagnosis present

## 2020-09-21 DIAGNOSIS — I444 Left anterior fascicular block: Secondary | ICD-10-CM | POA: Diagnosis present

## 2020-09-21 DIAGNOSIS — Z6830 Body mass index (BMI) 30.0-30.9, adult: Secondary | ICD-10-CM | POA: Diagnosis not present

## 2020-09-21 DIAGNOSIS — G9341 Metabolic encephalopathy: Secondary | ICD-10-CM | POA: Diagnosis present

## 2020-09-21 DIAGNOSIS — H9190 Unspecified hearing loss, unspecified ear: Secondary | ICD-10-CM | POA: Diagnosis present

## 2020-09-21 DIAGNOSIS — E876 Hypokalemia: Secondary | ICD-10-CM | POA: Diagnosis not present

## 2020-09-21 DIAGNOSIS — Z87891 Personal history of nicotine dependence: Secondary | ICD-10-CM | POA: Diagnosis not present

## 2020-09-21 DIAGNOSIS — E039 Hypothyroidism, unspecified: Secondary | ICD-10-CM

## 2020-09-21 DIAGNOSIS — Y900 Blood alcohol level of less than 20 mg/100 ml: Secondary | ICD-10-CM | POA: Diagnosis present

## 2020-09-21 DIAGNOSIS — E1165 Type 2 diabetes mellitus with hyperglycemia: Secondary | ICD-10-CM | POA: Diagnosis present

## 2020-09-21 DIAGNOSIS — N179 Acute kidney failure, unspecified: Secondary | ICD-10-CM | POA: Diagnosis present

## 2020-09-21 DIAGNOSIS — N182 Chronic kidney disease, stage 2 (mild): Secondary | ICD-10-CM | POA: Diagnosis present

## 2020-09-21 DIAGNOSIS — I6521 Occlusion and stenosis of right carotid artery: Secondary | ICD-10-CM | POA: Diagnosis present

## 2020-09-21 DIAGNOSIS — G9340 Encephalopathy, unspecified: Secondary | ICD-10-CM | POA: Diagnosis present

## 2020-09-21 DIAGNOSIS — R17 Unspecified jaundice: Secondary | ICD-10-CM | POA: Diagnosis not present

## 2020-09-21 DIAGNOSIS — E669 Obesity, unspecified: Secondary | ICD-10-CM | POA: Diagnosis present

## 2020-09-21 DIAGNOSIS — I129 Hypertensive chronic kidney disease with stage 1 through stage 4 chronic kidney disease, or unspecified chronic kidney disease: Secondary | ICD-10-CM | POA: Diagnosis present

## 2020-09-21 DIAGNOSIS — F05 Delirium due to known physiological condition: Secondary | ICD-10-CM | POA: Diagnosis present

## 2020-09-21 DIAGNOSIS — Z789 Other specified health status: Secondary | ICD-10-CM

## 2020-09-21 LAB — BASIC METABOLIC PANEL
Anion gap: 9 (ref 5–15)
BUN: 15 mg/dL (ref 8–23)
CO2: 26 mmol/L (ref 22–32)
Calcium: 8.7 mg/dL — ABNORMAL LOW (ref 8.9–10.3)
Chloride: 102 mmol/L (ref 98–111)
Creatinine, Ser: 1.03 mg/dL (ref 0.61–1.24)
GFR, Estimated: >60 mL/min (ref >60)
Glucose, Bld: 140 mg/dL — ABNORMAL HIGH (ref 70–99)
Potassium: 3.5 mmol/L (ref 3.5–5.1)
Sodium: 137 mmol/L (ref 135–145)

## 2020-09-21 LAB — CBC
HCT: 39.2 % (ref 39.0–52.0)
Hemoglobin: 13.2 g/dL (ref 13.0–17.0)
MCH: 33.1 pg (ref 26.0–34.0)
MCHC: 33.7 g/dL (ref 30.0–36.0)
MCV: 98.2 fL (ref 80.0–100.0)
Platelets: 223 10*3/uL (ref 150–400)
RBC: 3.99 MIL/uL — ABNORMAL LOW (ref 4.22–5.81)
RDW: 13.9 % (ref 11.5–15.5)
WBC: 14.6 10*3/uL — ABNORMAL HIGH (ref 4.0–10.5)
nRBC: 0 % (ref 0.0–0.2)

## 2020-09-21 LAB — URINALYSIS, ROUTINE W REFLEX MICROSCOPIC
Bacteria, UA: NONE SEEN
Bilirubin Urine: NEGATIVE
Glucose, UA: NEGATIVE mg/dL
Ketones, ur: 20 mg/dL — AB
Leukocytes,Ua: NEGATIVE
Nitrite: NEGATIVE
Protein, ur: NEGATIVE mg/dL
Specific Gravity, Urine: 1.019 (ref 1.005–1.030)
pH: 6 (ref 5.0–8.0)

## 2020-09-21 LAB — BLOOD GAS, VENOUS
Acid-Base Excess: 1.4 mmol/L (ref 0.0–2.0)
Bicarbonate: 26.1 mmol/L (ref 20.0–28.0)
O2 Saturation: 95 %
Patient temperature: 98.6
pCO2, Ven: 43.6 mmHg — ABNORMAL LOW (ref 44.0–60.0)
pH, Ven: 7.394 (ref 7.250–7.430)
pO2, Ven: 78.6 mmHg — ABNORMAL HIGH (ref 32.0–45.0)

## 2020-09-21 LAB — FOLATE: Folate: 32.1 ng/mL (ref >5.9)

## 2020-09-21 LAB — RESP PANEL BY RT-PCR (FLU A&B, COVID) ARPGX2
Influenza A by PCR: NEGATIVE
Influenza B by PCR: NEGATIVE
SARS Coronavirus 2 by RT PCR: NEGATIVE

## 2020-09-21 LAB — TSH: TSH: 3.326 u[IU]/mL (ref 0.350–4.500)

## 2020-09-21 LAB — LACTIC ACID, PLASMA: Lactic Acid, Venous: 1.7 mmol/L (ref 0.5–1.9)

## 2020-09-21 LAB — RPR: RPR Ser Ql: NONREACTIVE

## 2020-09-21 LAB — CBG MONITORING, ED: Glucose-Capillary: 136 mg/dL — ABNORMAL HIGH (ref 70–99)

## 2020-09-21 LAB — GLUCOSE, CAPILLARY
Glucose-Capillary: 148 mg/dL — ABNORMAL HIGH (ref 70–99)
Glucose-Capillary: 133 mg/dL — ABNORMAL HIGH (ref 70–99)
Glucose-Capillary: 98 mg/dL (ref 70–99)
Glucose-Capillary: 121 mg/dL — ABNORMAL HIGH (ref 70–99)

## 2020-09-21 LAB — ETHANOL: Alcohol, Ethyl (B): <10 mg/dL (ref <10)

## 2020-09-21 LAB — PROCALCITONIN: Procalcitonin: <0.1 ng/mL

## 2020-09-21 LAB — VITAMIN B12: Vitamin B-12: 467 pg/mL (ref 180–914)

## 2020-09-21 LAB — AMMONIA: Ammonia: 13 umol/L (ref 9–35)

## 2020-09-21 MED ORDER — ONDANSETRON HCL 4 MG/2ML IJ SOLN: 4.0000 mg | Freq: Four times a day (QID) | INTRAMUSCULAR | Status: DC | PRN

## 2020-09-21 MED ORDER — IPRATROPIUM-ALBUTEROL 0.5-2.5 (3) MG/3ML IN SOLN: 3.0000 mL | Freq: Four times a day (QID) | RESPIRATORY_TRACT | Status: DC | PRN

## 2020-09-21 MED ORDER — ASPIRIN EC 81 MG PO TBEC
81.0000 mg | DELAYED_RELEASE_TABLET | Freq: Every day | ORAL | Status: DC
  Administered 2020-09-21 – 2020-09-27 (×6): 81 mg via ORAL
  Filled 2020-09-21 (×6): qty 1

## 2020-09-21 MED ORDER — LORAZEPAM 1 MG PO TABS: 1.0000 mg | ORAL_TABLET | ORAL | Status: AC | PRN

## 2020-09-21 MED ORDER — SODIUM CHLORIDE 0.9 % IV SOLN
500.0000 mg | INTRAVENOUS | Status: AC
  Administered 2020-09-22 – 2020-09-25 (×4): 500 mg via INTRAVENOUS
  Filled 2020-09-21 (×4): qty 500

## 2020-09-21 MED ORDER — PRAVASTATIN SODIUM 40 MG PO TABS
40.0000 mg | ORAL_TABLET | Freq: Every day | ORAL | Status: DC
  Administered 2020-09-21 – 2020-09-27 (×6): 40 mg via ORAL
  Filled 2020-09-21: qty 2
  Filled 2020-09-21: qty 1
  Filled 2020-09-21: qty 2
  Filled 2020-09-21 (×2): qty 1
  Filled 2020-09-21: qty 2

## 2020-09-21 MED ORDER — THIAMINE HCL 100 MG PO TABS
100.0000 mg | ORAL_TABLET | Freq: Every day | ORAL | Status: DC
  Administered 2020-09-21 – 2020-09-27 (×6): 100 mg via ORAL
  Filled 2020-09-21 (×6): qty 1

## 2020-09-21 MED ORDER — ACETAMINOPHEN 650 MG RE SUPP: 650.0000 mg | Freq: Four times a day (QID) | RECTAL | Status: DC | PRN

## 2020-09-21 MED ORDER — LORAZEPAM 2 MG/ML IJ SOLN: 0.0000 mg | Freq: Four times a day (QID) | INTRAMUSCULAR | Status: AC

## 2020-09-21 MED ORDER — FOLIC ACID 1 MG PO TABS
1.0000 mg | ORAL_TABLET | Freq: Every day | ORAL | Status: DC
  Administered 2020-09-21 – 2020-09-27 (×6): 1 mg via ORAL
  Filled 2020-09-21 (×6): qty 1

## 2020-09-21 MED ORDER — LORAZEPAM 2 MG/ML IJ SOLN
0.0000 mg | Freq: Two times a day (BID) | INTRAMUSCULAR | Status: AC
  Administered 2020-09-24: 2 mg via INTRAVENOUS
  Filled 2020-09-21: qty 1

## 2020-09-21 MED ORDER — ONDANSETRON HCL 4 MG PO TABS: 4.0000 mg | ORAL_TABLET | Freq: Four times a day (QID) | ORAL | Status: DC | PRN

## 2020-09-21 MED ORDER — SODIUM CHLORIDE 0.9 % IV SOLN
2.0000 g | INTRAVENOUS | Status: AC
  Administered 2020-09-22 – 2020-09-25 (×4): 2 g via INTRAVENOUS
  Filled 2020-09-21: qty 20
  Filled 2020-09-21 (×3): qty 2

## 2020-09-21 MED ORDER — ADULT MULTIVITAMIN W/MINERALS CH
1.0000 | ORAL_TABLET | Freq: Every day | ORAL | Status: DC
  Administered 2020-09-21 – 2020-09-27 (×6): 1 via ORAL
  Filled 2020-09-21 (×6): qty 1

## 2020-09-21 MED ORDER — SODIUM CHLORIDE 0.9 % IV SOLN
1.0000 g | Freq: Once | INTRAVENOUS | Status: AC
  Administered 2020-09-21: 1 g via INTRAVENOUS
  Filled 2020-09-21: qty 10

## 2020-09-21 MED ORDER — SODIUM CHLORIDE 0.9 % IV SOLN: INTRAVENOUS | Status: DC

## 2020-09-21 MED ORDER — SODIUM CHLORIDE 0.9 % IV SOLN: INTRAVENOUS | Status: AC

## 2020-09-21 MED ORDER — LORAZEPAM 2 MG/ML IJ SOLN: 1.0000 mg | INTRAMUSCULAR | Status: AC | PRN

## 2020-09-21 MED ORDER — SODIUM CHLORIDE 0.9 % IV SOLN
500.0000 mg | Freq: Once | INTRAVENOUS | Status: AC
  Administered 2020-09-21: 500 mg via INTRAVENOUS
  Filled 2020-09-21: qty 500

## 2020-09-21 MED ORDER — THIAMINE HCL 100 MG/ML IJ SOLN
100.0000 mg | Freq: Every day | INTRAMUSCULAR | Status: DC
  Filled 2020-09-21: qty 2

## 2020-09-21 MED ORDER — GUAIFENESIN ER 600 MG PO TB12
1200.0000 mg | ORAL_TABLET | Freq: Two times a day (BID) | ORAL | Status: DC
  Administered 2020-09-21 – 2020-09-27 (×11): 1200 mg via ORAL
  Filled 2020-09-21 (×11): qty 2

## 2020-09-21 MED ORDER — ACETAMINOPHEN 325 MG PO TABS: 650.0000 mg | ORAL_TABLET | Freq: Four times a day (QID) | ORAL | Status: DC | PRN

## 2020-09-21 MED ORDER — ENOXAPARIN SODIUM 40 MG/0.4ML ~~LOC~~ SOLN
40.0000 mg | SUBCUTANEOUS | Status: DC
  Administered 2020-09-21 – 2020-09-25 (×5): 40 mg via SUBCUTANEOUS
  Filled 2020-09-21 (×5): qty 0.4

## 2020-09-21 MED ORDER — INSULIN ASPART 100 UNIT/ML ~~LOC~~ SOLN
0.0000 [IU] | SUBCUTANEOUS | Status: DC
  Administered 2020-09-21 – 2020-09-22 (×5): 1 [IU] via SUBCUTANEOUS
  Administered 2020-09-22: 3 [IU] via SUBCUTANEOUS
  Administered 2020-09-22: 1 [IU] via SUBCUTANEOUS
  Administered 2020-09-23 (×2): 2 [IU] via SUBCUTANEOUS
  Administered 2020-09-23 – 2020-09-24 (×4): 1 [IU] via SUBCUTANEOUS
  Administered 2020-09-24 – 2020-09-25 (×2): 2 [IU] via SUBCUTANEOUS
  Administered 2020-09-25 – 2020-09-27 (×3): 1 [IU] via SUBCUTANEOUS
  Filled 2020-09-21: qty 0.09

## 2020-09-21 NOTE — Progress Notes (Signed)
3 attempts made to notify spouse of patients admission and bed placement. Attempts have been unsuccesful, mailbox is full, unable to leave message.

## 2020-09-21 NOTE — Progress Notes (Signed)
Care started prior to midnight in the emergency room and patient was admitted early this morning after midnight by Dr. Marcial Pacas Opyd and I am in current agreement with his assessment and plan.  Additional changes to the plan of care been made accordingly.  The patient is a 80 year old obese Caucasian male with a past medical history significant for but not limited to type 2 diabetes mellitus, hypertension, mild renal insufficiency, hypothyroidism, history of peripheral edema as well as other comorbidities who presented with confusion and lethargy.  He was accompanied by his wife who provided most of the history and reported that he had trouble sleeping the night of 09/19/2020 and seemed to be confused the following morning.  He was able to ambulate about the house without any obvious weakness but was much more forgetful than usual and was even having trouble using the microwave and television remote.  She noticed that he became progressively more lethargic and confused throughout the day and was found at night to be lying on the floor up against a wall and was unable to say if he fell or how he got there.  At baseline he has been more forgetful in recent months to years but continues to drive walk unassisted and perform ADLs without assistance.  Per wife he drinks liquor every other day and is not actually forthcoming about how much he drinks and she noted that he often tries to hide his drinking more.  He has had a chronic cough has not had not seem to change recently.  Upon arrival to ED he is afebrile with saturation in the low 90s.  EKG showed ectopic atrial rhythms with PACs and left anterior fascicular block.  Chest x-ray showed bibasilar opacities.  Head CT was negative for any acute findings.  He did have a leukocytosis 15.9 admission lactic acid was normal.  In the ED he was given IV Rocephin and azithromycin.  Currently he is being admitted for and treated for the following but not limited to:  Acute  Encephalopathy  - Presents with progressive lethargy and confusion over 1-2 days, had no acute findings on head CT and no appreciable focal neurologic deficit  -Possibly related to infection, excessive alcohol use, or poor sleep recently  -Check TSH, ammonia, B12, folate, thiamine, blood gas, RPR, and ethanol levels  -Continue supportive care, gentle IVF hydration with normal saline at 75 MLS per hour for another 1 day, empiric Rocephin and azithromycin   for suspected pneumonia as below -May pursue MRI if not improving and or EEG as well as discussion with neurology -Urinalysis was unrevealing but did show 21-50 RBCs per high-power field and urine culture still pending -We will obtain PT OT evaluation  ?Pneumonia  - Presents with lethargy and confusion and is found to have WBC 15,900 with bibasilar opacities on CXR  -VBG:    Component Value Date/Time   HCO3 26.1 09/21/2020 0441   O2SAT 95.0 09/21/2020 0441   - Blood culture collected in ED and Rocephin and azithromycin started  - Check procalcitonin and this was less than 0.10, check strep pneumo and legionella antigens, follow cultures, and continue Rocephin and azithromycin for now   -WBC is improving went from 15.9 is now 14.6 -We will add guaifenesin, flutter valve, incentive spirometer and if necessary will add as needed breathing treatment -Repeat chest x-ray in the a.m.  Alcohol use - Patient drinks liquor everyday but wife unsure of quantity, noting that he hides it from her  -Alcohol level  is less than 10 - Monitor for withdrawal, supplement vitamins after B12, thiamine, and folate levels drawn   -He is initiated on a CIWA protocol with p.o./IV lorazepam  CKD II  - SCr is 1.23 in ED on admission, slightly up from apparent baseline -Now BUNs/creatinine is 15/1.03 -Avoid nephrotoxic medications, contrast dyes, hypotension and renally adjust medications -Repeat CMP in a.m.  Type II DM  - A1c was 7.7% in November  -  Check CBGs and use low-intensity SSI for now  -CBGs ranging from 136-1 48 next-continue monitor blood sugars per protocol and adjust insulin regimen as necessary  Obesity -Complicates overall prognosis and care -Estimated body mass index is 30.27 kg/m as calculated from the following:   Height as of this encounter: 5\' 8"  (1.727 m).   Weight as of this encounter: 90.3 kg. -Weight Loss and Dietary Counseling given   We will continue to monitor the patient's clinical response to intervention and repeat blood work and imaging in the a.m.  We will follow up on therapy recommendations as well and continue to monitor the patient closely.

## 2020-09-21 NOTE — ED Notes (Signed)
Attempted to call spouse and notify of admission and bed assignment, no response and mailbox was full so unable to leave VM.

## 2020-09-21 NOTE — ED Notes (Signed)
Report given to Margaretha Seeds, RN

## 2020-09-21 NOTE — Evaluation (Signed)
Occupational Therapy Evaluation Patient Details Name: Vincent Campbell MRN: [id number] DOB: 1941/05/05 Today's Date: 09/21/2020    History of Present Illness The patient is a 80 year old obese Caucasian male with a past medical history significant for but not limited to type 2 diabetes mellitus, hypertension, mild renal insufficiency, hypothyroidism, history of peripheral edema as well as other comorbidities who presented with confusion and lethargy. At baseline he has been more forgetful in recent months to years but continues to drive and walk unassisted and perform ADLs without assistance.  Per wife he drinks liquor every other day and is not actually forthcoming about how much he drinks and she noted that he often tries to hide his drinking more.   Clinical Impression   [name] is a 80 year old man normally independent with ADLs and ambulation who lives at home with his wife. He is very hard of hearing and communication impaired during evaluation and detailed information in regards to deficits hard to assess. 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 On evaluation patient exhibits decreased ROM of left shoulder compared to right, bradykinesia of left arm movement - gross and fine. Finger to thumb movement difficult and slow, finger to nose able to grossly be performed but ROM decreased, movement slow, positioning of hand awkward compared to right. Unable to perform rapid alternating movement (diadochokinesis) with left hand. Appears to have some decreased sensation of left upper extremity - he is unable to answer when asked "Can you feel this?" With pinching along the arm - patient does not respond until last pinch on the last arm and then states "I don't like to be pinched" but he doesn't flinch. When therapist pinched the back of his calf - patient yelled. During testing  patient always initiates movement with the right arm and has to be cued to the left. Wife says he doesn't use his left arm much because of the tremors. Patient needed assistance to don underwear (attempted to don leaned back in bed), manage tooth brush and toothpaste (Unable to take cap off, use left hand effectively, and then tried to place cap on top of tooth brush) and unable to manipulate socks successfully to fold them. In questioning patient's spouse she is unable to be detailed in how he does his ADLs - when asked "is this normal" she is unable to clarify. Patient min guard to ambulate in room holding onto IV pole but needs cues to reach correct destination. Patient alert and oriented in regards to self, place and time. He exhibits some mild word finding difficulties and became frustrated with therapist. Patient will benefit from skilled OT services while in hospital to improve deficits in order to return to PLOF and return home with spouse.        Follow Up Recommendations  Home health OT;Supervision/Assistance - 24 hour    Equipment Recommendations  Tub/shower seat    Recommendations for Other Services       Precautions / Restrictions Precautions Precautions: Fall      Mobility Bed Mobility Overal bed mobility: Needs Assistance Bed Mobility: Supine to Sit     Supine to sit: HOB elevated     General bed mobility comments: Use of bed rails and a little bit of a struggle to transfer to side of bed - therapist applied counterpressure to RLE to assist with coming into sitting.  Transfers Overall transfer level: Needs assistance Equipment used: 1 person hand held assist Transfers: Sit to/from Stand Sit to Stand: Min guard         General transfer comment: MIn guard for safety and tactile cues for destiation to ambulate to bathroom and return to edge of bed then recliner.    Balance Overall balance assessment: Mild deficits observed, not formally tested                                          ADL either performed or assessed with clinical judgement   ADL Overall ADL's : Needs assistance/impaired Eating/Feeding: Set up   Grooming: Set up;Supervision/safety;Oral care Grooming Details (indicate cue type and reason): Unable to get cap off of toothpaste - needed therapist assistance, difficulty controllilng left hand but able to stabilize toothbrush in left hand, after placing toothpaste on toothbrush - tried to put cap on end of tooth brush. Upper Body Bathing: Set up;Cueing for sequencing;Sitting;Supervision/ safety   Lower Body Bathing: Set up;Min guard;Sit to/from stand;Supervison/ safety;Cueing for sequencing   Upper Body Dressing : Minimal assistance;Sitting;Supervision/safety   Lower Body Dressing: Moderate assistance;Set up;Supervision/safety;Cueing for sequencing;Sit to/from stand Lower Body Dressing Details (indicate cue type and reason): for socks and underwear. When donning underwear leaned back in back to get legs through and then attempting to pull up. Therapist had patient stand up to perform which was easier - difficulty getting underwear up on left hip. Toilet Transfer: Ambulance person and Hygiene: Minimal assistance;Sit to/from stand       Functional mobility during ADLs: Minimal assistance;Cueing for safety General ADL Comments: Moving the wrong way, verbal cues for correct destination.     Vision Baseline Vision/History: Cataracts Additional Comments: Patient states "I'm blind" - wife reports hx of cataract surgery but states "He can see the tv"     Perception     Praxis      Pertinent Vitals/Pain Pain Assessment: No/denies pain     Hand Dominance Left (but switched to right after new onset of tremors)   Extremity/Trunk Assessment Upper Extremity Assessment Upper Extremity Assessment: RUE deficits/detail;LUE deficits/detail RUE Deficits / Details: WFL ROM  (able to reach behind head), 5/5 strength RUE Sensation: WNL RUE Coordination: WNL (able to perform finger to nose, finger to thumb) LUE Deficits / Details: Able to reach top of head, but not behind, decreased shoulder ROM. Strength functional. hx of left hand tremors. Wife reports disuse of left arm. LUE Sensation: decreased light touch (Unable to answer question in regards to "can you feel me," when asked if arm numb he said "yes it's numb that's kind of new" but could say when it started. With painful stimuli - therapist pinched arm with patiently barely responding.) LUE Coordination: decreased fine motor;decreased gross motor (decreased ability to perform finger to thumb -slow and impaired. Bradykinetic movements, grossly able to perform finger to nose but not smoothly, not extending as far as RUE.)   Lower Extremity Assessment Lower Extremity Assessment: Defer to PT evaluation   Cervical / Trunk Assessment Cervical / Trunk Assessment: Normal   Communication Communication Communication: HOH   Cognition Arousal/Alertness: Awake/alert Behavior During Therapy: WFL for tasks assessed/performed Overall Cognitive Status: Within Functional Limits for tasks assessed  General Comments: Alert to self, knows he is at a [facility] hospital, knows the year, the month and very close to the date. Needs a lot of verbal/visual cues to follow commands for MMT/neuro testing. Per chart - increased memory deficits in the last year.   General Comments       Exercises     Shoulder Instructions      Home Living Family/patient expects to be discharged to:: Private residence Living Arrangements: Spouse/significant other Available Help at Discharge: Family Type of Home: House       Home Layout: One level     Bathroom Shower/Tub: Tub/shower unit;Walk-in shower (wife has walk in, patient has tub/shower)                    Prior Functioning/Environment  Level of Independence: Independent                 OT Problem List: Decreased range of motion;Decreased activity tolerance;Impaired balance (sitting and/or standing);Decreased coordination;Decreased safety awareness;Decreased knowledge of use of DME or AE;Impaired sensation;Impaired UE functional use;Decreased cognition;Impaired vision/perception      OT Treatment/Interventions: Self-care/ADL training;Therapeutic exercise;Neuromuscular education;DME and/or AE instruction;Therapeutic activities;Balance training;Patient/family education;Cognitive remediation/compensation;Visual/perceptual remediation/compensation    OT Goals(Current goals can be found in the care plan section) Acute Rehab OT Goals Patient Stated Goal: to return to baseline OT Goal Formulation: With family Time For Goal Achievement: 10/04/20 Potential to Achieve Goals: Good  OT Frequency: Min 2X/week   Barriers to D/C:            Co-evaluation              AM-PAC OT "6 Clicks" Daily Activity     Outcome Measure Help from another person eating meals?: A Little Help from another person taking care of personal grooming?: A Little Help from another person toileting, which includes using toliet, bedpan, or urinal?: A Little Help from another person bathing (including washing, rinsing, drying)?: A Little Help from another person to put on and taking off regular upper body clothing?: A Little Help from another person to put on and taking off regular lower body clothing?: A Lot 6 Click Score: 17   End of Session Nurse Communication: Mobility status (LUE deficits)  Activity Tolerance: Patient tolerated treatment well Patient left: in chair;with chair alarm set;with family/visitor present  OT Visit Diagnosis: Unsteadiness on feet (R26.81);History of falling (Z91.81);Other symptoms and signs involving the nervous system (R29.898);Low vision, both eyes (H54.2)                Time: 1696-7893 OT Time Calculation  (min): 34 min Charges:  OT General Charges $OT Visit: 1 Visit OT Evaluation $OT Eval Moderate Complexity: 1 Mod OT Treatments $Self Care/Home Management : 8-22 mins  Jayliah Benett, OTR/L Acute Care Rehab Services  Office [phone] Pager: [phone]   Kelli Churn 09/21/2020, 3:33 PM

## 2020-09-21 NOTE — ED Notes (Signed)
Vincent Campbell took husband gold necklace home with her.

## 2020-09-21 NOTE — H&P (Signed)
History and Physical    Vincent Campbell WUJ:[id number] DOB: 01/10/1941 DOA: 09/20/2020  PCP: Corwin Levins, MD   Patient coming from: Home   Chief Complaint: Confusion, lethargy   HPI: Vincent Campbell is a 80 y.o. male with medical history significant for type 2 diabetes mellitus, hypertension, mild renal insufficiency, hypothyroidism, presenting to the emergency department with lethargy and confusion.  The patient is accompanied by his wife who provides most of the history.  Patient reported that he had trouble sleeping the night of 09/19/2020, seemed to be confused the following morning, was able to ambulate about the house with out any obvious weakness, but was much more forgetful than usual and was even having trouble using the microwave and television remote.  He was progressively more lethargic and confused throughout the day, and was found last night to be lying on the floor up against a wall and unable to say if he fell or how he got there.  At baseline, he has been more forgetful in recent months to years but continues to drive, walk unassisted, and perform his ADLs without assistance.  He drinks liquor every day, is not forthcoming with his wife about how much he drinks, and she notes that he often tries to hide his drinking from her.  He has a chronic cough that has not seemed to change recently, has not been complaining of any thing in particular other than trouble sleeping the night before last.  ED Course: Upon arrival to the ED, patient is found to be afebrile, saturating low 90s on room air, and with stable blood pressure.  EKG features ectopic atrial rhythm with PAC and LAFB.  Chest x-ray with bibasilar opacities.  Head CT negative for acute findings.  Cervical spine CT without acute fracture or subluxation.  Chemistry panel features a creatinine 1.23.  CBC notable for leukocytosis to 15,900.  Lactic acid is normal.  Blood and urine culture were collected in the emergency department and  the patient was given Rocephin and azithromycin.  Review of Systems:  All other systems reviewed and apart from HPI, are negative.  Past Medical History:  Diagnosis Date  . BACK PAIN 01/12/2008  . COLONIC POLYPS, HX OF 01/12/2008  . Cough 05/28/2010  . Encounter for well adult exam with abnormal findings 12/29/2010  . HYPERLIPIDEMIA 01/12/2008  . HYPERTENSION 01/12/2008  . Hypothyroidism 09/15/2018  . LEUKOCYTOSIS 03/02/2008  . PERIPHERAL EDEMA 06/28/2010  . SWELLING MASS OR LUMP IN HEAD AND NECK 05/28/2010  . Tremor 08/02/2012    Past Surgical History:  Procedure Laterality Date  . APPENDECTOMY    . CATARACT EXTRACTION    . CHOLECYSTECTOMY      Social History:   reports that he has been smoking. He has never used smokeless tobacco. He reports current alcohol use. He reports that he does not use drugs.  No Known Allergies  Family History  Problem Relation Age of Onset  . Cancer Other        esophageal cancer  . Heart disease Other   . Hypertension Other      Prior to Admission medications   Medication Sig Start Date End Date Taking? Authorizing Provider  aspirin 81 MG tablet Take 81 mg by mouth daily.   Yes [provider]  Glucosamine 500 MG CAPS Take 500 mg by mouth daily.   Yes [provider]  hydrochlorothiazide (MICROZIDE) 12.5 MG capsule Take 1 capsule (12.5 mg total) by mouth daily. 04/12/20  Yes John,  Len BlalockJames W, MD  losartan (COZAAR) 25 MG tablet Take 4 tablets (100 mg total) by mouth daily. 07/30/20  Yes Corwin LevinsJohn, Trentyn W, MD  metFORMIN (GLUCOPHAGE-XR) 500 MG 24 hr tablet Take 3 tablets (1,500 mg total) by mouth daily with breakfast. 09/24/19  Yes Corwin LevinsJohn, Jahson W, MD  pravastatin (PRAVACHOL) 40 MG tablet Take 1 tablet (40 mg total) by mouth daily. 04/12/20  Yes Corwin LevinsJohn, Johnie W, MD  VITAMIN D PO Take 2 capsules by mouth daily.   Yes [provider]  ZINC CITRATE PO Take 1 tablet by mouth daily at 6 (six) AM.   Yes [provider]  levothyroxine  (SYNTHROID) 25 MCG tablet TAKE 1 TABLET BY MOUTH ONCE DAILY BEFORE BREAKFAST Patient not taking: No sig reported 09/20/19   Corwin LevinsJohn, Anwar W, MD  losartan (COZAAR) 100 MG tablet Take 1 tablet by mouth once daily Patient not taking: Reported on 09/21/2020 07/23/20   Corwin LevinsJohn, Franciscojavier W, MD  propranolol (INDERAL) 10 MG tablet Take 1 tablet (10 mg total) by mouth 2 (two) times daily. Patient not taking: Reported on 09/21/2020 04/12/20   Corwin LevinsJohn, Mingo W, MD    Physical Exam: Vitals:   09/21/20 0130 09/21/20 0200 09/21/20 0230 09/21/20 0300  BP: (!) 146/80 (!) 146/77 (!) 152/98 (!) 145/66  Pulse: 81 85 90 82  Resp: 12 12 12 12   Temp:      TempSrc:      SpO2: 95% 94% 94% 93%  Weight:      Height:        Constitutional: NAD, somnolent   Eyes: PERTLA, lids and conjunctivae normal ENMT: Mucous membranes are moist. Posterior pharynx clear of any exudate or lesions.   Neck: normal, supple, no masses, no thyromegaly Respiratory: no wheezing, no crackles. No accessory muscle use.  Cardiovascular: S1 & S2 heard, regular rate and rhythm. Pretibial pitting edema bilaterally.  Abdomen: No distension, no tenderness, soft. Bowel sounds active.  Musculoskeletal: no clubbing / cyanosis. No joint deformity upper and lower extremities.   Skin: no significant rashes, lesions, ulcers. Warm, dry, well-perfused. Neurologic: CN 2-12 grossly intact. Sensation intact. Moving all extremities.  Psychiatric: Sleeping, wakes briefly to loud voice but does not stay awake long enough to answer questions or follow commands.    Labs and Imaging on Admission: I have personally reviewed following labs and imaging studies  CBC: Recent Labs  Lab 09/20/20 2325  WBC 15.9*  NEUTROABS 11.6*  HGB 13.9  HCT 41.8  MCV 99.1  PLT 260   Basic Metabolic Panel: Recent Labs  Lab 09/20/20 2325  NA 137  K 3.6  CL 101  CO2 25  GLUCOSE 137*  BUN 18  CREATININE 1.23  CALCIUM 9.2   GFR: Estimated Creatinine Clearance: 53.2 mL/min  (by C-G formula based on SCr of 1.23 mg/dL). Liver Function Tests: Recent Labs  Lab 09/20/20 2325  AST 23  ALT 19  ALKPHOS 53  BILITOT 1.2  PROT 7.8  ALBUMIN 4.2   No results for input(s): LIPASE, AMYLASE in the last 168 hours. No results for input(s): AMMONIA in the last 168 hours. Coagulation Profile: Recent Labs  Lab 09/20/20 2325  INR 1.3*   Cardiac Enzymes: No results for input(s): CKTOTAL, CKMB, CKMBINDEX, TROPONINI in the last 168 hours. BNP (last 3 results) No results for input(s): PROBNP in the last 8760 hours. HbA1C: No results for input(s): HGBA1C in the last 72 hours. CBG: No results for input(s): GLUCAP in the last 168 hours. Lipid Profile: No  results for input(s): CHOL, HDL, LDLCALC, TRIG, CHOLHDL, LDLDIRECT in the last 72 hours. Thyroid Function Tests: No results for input(s): TSH, T4TOTAL, FREET4, T3FREE, THYROIDAB in the last 72 hours. Anemia Panel: No results for input(s): VITAMINB12, FOLATE, FERRITIN, TIBC, IRON, RETICCTPCT in the last 72 hours. Urine analysis:    Component Value Date/Time   COLORURINE YELLOW 09/21/2020 0200   APPEARANCEUR CLEAR 09/21/2020 0200   LABSPEC 1.019 09/21/2020 0200   PHURINE 6.0 09/21/2020 0200   GLUCOSEU NEGATIVE 09/21/2020 0200   GLUCOSEU NEGATIVE 09/20/2019 1146   HGBUR SMALL (A) 09/21/2020 0200   BILIRUBINUR NEGATIVE 09/21/2020 0200   KETONESUR 20 (A) 09/21/2020 0200   PROTEINUR NEGATIVE 09/21/2020 0200   UROBILINOGEN 0.2 09/20/2019 1146   NITRITE NEGATIVE 09/21/2020 0200   LEUKOCYTESUR NEGATIVE 09/21/2020 0200   Sepsis Labs: @LABRCNTIP (procalcitonin:4,lacticidven:4) ) Recent Results (from the past 240 hour(s))  Resp Panel by RT-PCR (Flu A&B, Covid) Nasopharyngeal Swab     Status: None   Collection Time: 09/20/20 11:57 PM   Specimen: Nasopharyngeal Swab; Nasopharyngeal(NP) swabs in vial transport medium  Result Value Ref Range Status   SARS Coronavirus 2 by RT PCR NEGATIVE NEGATIVE Final    Comment:  (NOTE) SARS-CoV-2 target nucleic acids are NOT DETECTED.  The SARS-CoV-2 RNA is generally detectable in upper respiratory specimens during the acute phase of infection. The lowest concentration of SARS-CoV-2 viral copies this assay can detect is 138 copies/mL. A negative result does not preclude SARS-Cov-2 infection and should not be used as the sole basis for treatment or other patient management decisions. A negative result may occur with  improper specimen collection/handling, submission of specimen other than nasopharyngeal swab, presence of viral mutation(s) within the areas targeted by this assay, and inadequate number of viral copies(<138 copies/mL). A negative result must be combined with clinical observations, patient history, and epidemiological information. The expected result is Negative.  Fact Sheet for Patients:  09/22/20  Fact Sheet for Healthcare Providers:  BloggerCourse.com  This test is no t yet approved or cleared by the [employer] FDA and  has been authorized for detection and/or diagnosis of SARS-CoV-2 by FDA under an Emergency Use Authorization (EUA). This EUA will remain  in effect (meaning this test can be used) for the duration of the COVID-19 declaration under Section 564(b)(1) of the Act, 21 U.S.C.section 360bbb-3(b)(1), unless the authorization is terminated  or revoked sooner.       Influenza A by PCR NEGATIVE NEGATIVE Final   Influenza B by PCR NEGATIVE NEGATIVE Final    Comment: (NOTE) The Xpert Xpress SARS-CoV-2/FLU/RSV plus assay is intended as an aid in the diagnosis of influenza from Nasopharyngeal swab specimens and should not be used as a sole basis for treatment. Nasal washings and aspirates are unacceptable for Xpert Xpress SARS-CoV-2/FLU/RSV testing.  Fact Sheet for Patients: [place]  Fact Sheet for Healthcare  Providers: BloggerCourse.com  This test is not yet approved or cleared by the [employer] FDA and has been authorized for detection and/or diagnosis of SARS-CoV-2 by FDA under an Emergency Use Authorization (EUA). This EUA will remain in effect (meaning this test can be used) for the duration of the COVID-19 declaration under Section 564(b)(1) of the Act, 21 U.S.C. section 360bbb-3(b)(1), unless the authorization is terminated or revoked.  Performed at [facility], [address] [place], [place]      Radiological Exams on Admission: CT HEAD WO CONTRAST  Result Date: 09/21/2020 CLINICAL DATA:  Increasing weakness and altered mental status,  status post fall. EXAM: CT HEAD WITHOUT CONTRAST TECHNIQUE: Contiguous axial images were obtained from the base of the skull through the vertex without intravenous contrast. COMPARISON:  May 16, 2006 FINDINGS: Brain: There is mild cerebral atrophy with widening of the extra-axial spaces and ventricular dilatation. There are areas of decreased attenuation within the white matter tracts of the supratentorial brain, consistent with microvascular disease changes. Vascular: No hyperdense vessel or unexpected calcification. Skull: Normal. Negative for fracture or focal lesion. Sinuses/Orbits: No acute finding. Other: None. IMPRESSION: 1. Generalized cerebral atrophy. 2. No acute intracranial abnormality. Electronically Signed   By: Aram Candela M.D.   On: 09/21/2020 01:35   CT CERVICAL SPINE WO CONTRAST  Result Date: 09/21/2020 CLINICAL DATA:  Status post fall. EXAM: CT CERVICAL SPINE WITHOUT CONTRAST TECHNIQUE: Multidetector CT imaging of the cervical spine was performed without intravenous contrast. Multiplanar CT image reconstructions were also generated. COMPARISON:  None. FINDINGS: Alignment: Normal. Skull base and vertebrae: No acute fracture. No primary bone lesion or focal pathologic  process. Soft tissues and spinal canal: No prevertebral fluid or swelling. No visible canal hematoma. Disc levels: Moderate severity endplate sclerosis is seen at the levels of C5-C6 and C6-C7. Moderate to marked severity intervertebral disc space narrowing is also seen at the levels of C5-C6 and C6-C7. Bilateral moderate severity multilevel facet joint hypertrophy is noted. Upper chest: Negative. Other: None. IMPRESSION: 1. Moderate to marked severity degenerative changes at the levels of C5-C6 and C6-C7. 2. No evidence of an acute fracture or subluxation. Electronically Signed   By: Aram Candela M.D.   On: 09/21/2020 01:38   DG Chest Port 1 View  Result Date: 09/20/2020 CLINICAL DATA:  Weakness and confusion. EXAM: PORTABLE CHEST 1 VIEW COMPARISON:  February 22, 2014 FINDINGS: Mild areas of atelectasis and/or infiltrate are seen within the bilateral lung bases. There is no evidence of a pleural effusion or pneumothorax. The heart size and mediastinal contours are within normal limits. The visualized skeletal structures are unremarkable. IMPRESSION: Mild bibasilar atelectasis and/or infiltrate. Electronically Signed   By: Aram Candela M.D.   On: 09/20/2020 23:38    EKG: Independently reviewed. Ectopic atrial rhythm, PAC, LAFB.   Assessment/Plan   1. Acute encephalopathy  - Presents with progressive lethargy and confusion over 1-2 days, had no acute findings on head CT and no appreciable focal neurologic deficit  - Possibly related to infection, excessive alcohol use, or poor sleep recently  - Check TSH, ammonia, B12, folate, thiamine, blood gas, RPR, and ethanol levels  - Continue supportive care, gentle IVF hydration, empiric Rocephin and azithromycin    2. ?Pneumonia  - Presents with lethargy and confusion and is found to have WBC 15,900 with bibasilar opacities on CXR  - Blood culture collected in ED and Rocephin and azithromycin started  - Check procalcitonin, check strep pneumo and  legionella antigens, follow cultures, and continue Rocephin and azithromycin for now    3. Alcohol use - Patient drinks liquor everyday but wife unsure of quantity, noting that he hides it from her  - Monitor for withdrawal, supplement vitamins after B12, thiamine, and folate levels drawn    4. CKD II  - SCr is 1.23 in ED, slightly up from apparent baseline  - Renally-dose medications, monitor    5. Type II DM  - A1c was 7.7% in November  - Check CBGs and use low-intensity SSI for now    DVT prophylaxis: Lovenox  Code Status: Full  Level of Care: Level  of care: Med-Surg Family Communication: Wife updated at bedside Disposition Plan:  Patient is from: Home  Anticipated d/c is to: TBD Anticipated d/c date is: 2/26 or 09/23/20 Patient currently: Pending additional laboratory work-up, improvement in mental status  Consults called: None  Admission status: Observation     Briscoe Deutscher, MD [facility]  09/21/2020, 4:14 AM

## 2020-09-21 NOTE — Evaluation (Signed)
Clinical/Bedside Swallow Evaluation Patient Details  Name: Vincent Campbell MRN: [id number] Date of Birth: [birth year]  Today's Date: 09/21/2020 Time: SLP Start Time (ACUTE ONLY): 1320 SLP Stop Time (ACUTE ONLY): 1328 SLP Time Calculation (min) (ACUTE ONLY): 8 min  Past Medical History:  Past Medical History:  Diagnosis Date  . BACK PAIN 01/12/2008  . COLONIC POLYPS, HX OF 01/12/2008  . Cough 05/28/2010  . Encounter for well adult exam with abnormal findings 12/29/2010  . HYPERLIPIDEMIA 01/12/2008  . HYPERTENSION 01/12/2008  . Hypothyroidism 09/15/2018  . LEUKOCYTOSIS 03/02/2008  . PERIPHERAL EDEMA 06/28/2010  . SWELLING MASS OR LUMP IN HEAD AND NECK 05/28/2010  . Tremor 08/02/2012   Past Surgical History:  Past Surgical History:  Procedure Laterality Date  . APPENDECTOMY    . CATARACT EXTRACTION    . CHOLECYSTECTOMY     HPI:  The patient is a 80 year old obese Caucasian male with a past medical history significant for but not limited to type 2 diabetes mellitus, hypertension, mild renal insufficiency, hypothyroidism, history of peripheral edema as well as other comorbidities.   Presents with progressive lethargy and confusion over 1-2 days, had no acute findings on head CT and no appreciable focal neurologic deficit   -Possibly related to infection, excessive alcohol use, or poor sleep recently per MD.   At baseline he has been more forgetful in recent months to years but continues to drive walk unassisted and perform ADLs without assistance.  Per wife he drinks liquor every other day and is not actually forthcoming about how much he drinks and she noted that he often tries to hide his drinking more.  He has had a chronic cough has not had not seem to change recently.   Assessment / Plan / Recommendation Clinical Impression  Pt demonstrates no signs of aspiration or dysphagia. He prefers soft foods given that he often does not wear his partial during meals. Recommend pt continue a soft diet with  thin liquids. SLP will sign off. SLP Visit Diagnosis: Dysphagia, unspecified (R13.10)    Aspiration Risk  Mild aspiration risk    Diet Recommendation Dysphagia 3 (Mech soft);Thin liquid   Liquid Administration via: Straw;Cup Medication Administration: Whole meds with liquid    Other  Recommendations     Follow up Recommendations None      Frequency and Duration            Prognosis        Swallow Study   General HPI: The patient is a 80 year old obese Caucasian male with a past medical history significant for but not limited to type 2 diabetes mellitus, hypertension, mild renal insufficiency, hypothyroidism, history of peripheral edema as well as other comorbidities.   Presents with progressive lethargy and confusion over 1-2 days, had no acute findings on head CT and no appreciable focal neurologic deficit   -Possibly related to infection, excessive alcohol use, or poor sleep recently per MD.   At baseline he has been more forgetful in recent months to years but continues to drive walk unassisted and perform ADLs without assistance.  Per wife he drinks liquor every other day and is not actually forthcoming about how much he drinks and she noted that he often tries to hide his drinking more.  He has had a chronic cough has not had not seem to change recently. Type of Study: Bedside Swallow Evaluation Diet Prior to this Study: Thin liquids;Dysphagia 3 (soft) Temperature Spikes Noted: No Respiratory Status: Room air History of Recent Intubation:  No Behavior/Cognition: Alert;Cooperative;Pleasant mood Oral Cavity Assessment: Within Functional Limits Oral Care Completed by SLP: No Oral Cavity - Dentition: Other (Comment) (partial, not in place) Self-Feeding Abilities: Able to feed self Patient Positioning: Upright in bed Baseline Vocal Quality: Normal Volitional Cough: Strong    Oral/Motor/Sensory Function Overall Oral Motor/Sensory Function: Within functional limits   Ice Chips      Thin Liquid Thin Liquid: Within functional limits Presentation: Straw    Nectar Thick Nectar Thick Liquid: Not tested   Honey Thick Honey Thick Liquid: Not tested   Puree Puree: Within functional limits   Solid     Solid: Within functional limits     Harlon Ditty, MA CCC-SLP  Acute Rehabilitation Services Pager [phone] Office [phone]  Dyanne Iha, Riley Nearing 09/21/2020,1:55 PM

## 2020-09-22 DIAGNOSIS — E1122 Type 2 diabetes mellitus with diabetic chronic kidney disease: Secondary | ICD-10-CM | POA: Diagnosis not present

## 2020-09-22 DIAGNOSIS — Z7289 Other problems related to lifestyle: Secondary | ICD-10-CM | POA: Diagnosis not present

## 2020-09-22 DIAGNOSIS — I10 Essential (primary) hypertension: Secondary | ICD-10-CM | POA: Diagnosis not present

## 2020-09-22 DIAGNOSIS — N182 Chronic kidney disease, stage 2 (mild): Secondary | ICD-10-CM | POA: Diagnosis not present

## 2020-09-22 LAB — CBC WITH DIFFERENTIAL/PLATELET
Abs Immature Granulocytes: 0.05 10*3/uL (ref 0.00–0.07)
Basophils Absolute: 0.1 10*3/uL (ref 0.0–0.1)
Basophils Relative: 1 %
Eosinophils Absolute: 0.3 10*3/uL (ref 0.0–0.5)
Eosinophils Relative: 2 %
HCT: 39.4 % (ref 39.0–52.0)
Hemoglobin: 13.1 g/dL (ref 13.0–17.0)
Immature Granulocytes: 0 %
Lymphocytes Relative: 17 %
Lymphs Abs: 2.2 10*3/uL (ref 0.7–4.0)
MCH: 33.2 pg (ref 26.0–34.0)
MCHC: 33.2 g/dL (ref 30.0–36.0)
MCV: 99.7 fL (ref 80.0–100.0)
Monocytes Absolute: 2 10*3/uL — ABNORMAL HIGH (ref 0.1–1.0)
Monocytes Relative: 15 %
Neutro Abs: 8.2 10*3/uL — ABNORMAL HIGH (ref 1.7–7.7)
Neutrophils Relative %: 65 %
Platelets: 207 10*3/uL (ref 150–400)
RBC: 3.95 MIL/uL — ABNORMAL LOW (ref 4.22–5.81)
RDW: 13.9 % (ref 11.5–15.5)
WBC: 12.8 10*3/uL — ABNORMAL HIGH (ref 4.0–10.5)
nRBC: 0 % (ref 0.0–0.2)

## 2020-09-22 LAB — COMPREHENSIVE METABOLIC PANEL
ALT: 18 U/L (ref 0–44)
AST: 22 U/L (ref 15–41)
Albumin: 3.7 g/dL (ref 3.5–5.0)
Alkaline Phosphatase: 54 U/L (ref 38–126)
Anion gap: 11 (ref 5–15)
BUN: 12 mg/dL (ref 8–23)
CO2: 23 mmol/L (ref 22–32)
Calcium: 8.3 mg/dL — ABNORMAL LOW (ref 8.9–10.3)
Chloride: 104 mmol/L (ref 98–111)
Creatinine, Ser: 0.9 mg/dL (ref 0.61–1.24)
GFR, Estimated: >60 mL/min (ref >60)
Glucose, Bld: 107 mg/dL — ABNORMAL HIGH (ref 70–99)
Potassium: 3.4 mmol/L — ABNORMAL LOW (ref 3.5–5.1)
Sodium: 138 mmol/L (ref 135–145)
Total Bilirubin: 1.4 mg/dL — ABNORMAL HIGH (ref 0.3–1.2)
Total Protein: 6.7 g/dL (ref 6.5–8.1)

## 2020-09-22 LAB — PROCALCITONIN: Procalcitonin: <0.1 ng/mL

## 2020-09-22 LAB — MAGNESIUM: Magnesium: 2.1 mg/dL (ref 1.7–2.4)

## 2020-09-22 LAB — GLUCOSE, CAPILLARY
Glucose-Capillary: 103 mg/dL — ABNORMAL HIGH (ref 70–99)
Glucose-Capillary: 104 mg/dL — ABNORMAL HIGH (ref 70–99)
Glucose-Capillary: 120 mg/dL — ABNORMAL HIGH (ref 70–99)
Glucose-Capillary: 122 mg/dL — ABNORMAL HIGH (ref 70–99)
Glucose-Capillary: 130 mg/dL — ABNORMAL HIGH (ref 70–99)
Glucose-Capillary: 131 mg/dL — ABNORMAL HIGH (ref 70–99)
Glucose-Capillary: 236 mg/dL — ABNORMAL HIGH (ref 70–99)

## 2020-09-22 LAB — URINE CULTURE: Culture: NO GROWTH

## 2020-09-22 LAB — PHOSPHORUS: Phosphorus: 2.7 mg/dL (ref 2.5–4.6)

## 2020-09-22 LAB — STREP PNEUMONIAE URINARY ANTIGEN: Strep Pneumo Urinary Antigen: NEGATIVE

## 2020-09-22 LAB — VITAMIN B1

## 2020-09-22 MED ORDER — POTASSIUM CHLORIDE CRYS ER 20 MEQ PO TBCR
40.0000 meq | EXTENDED_RELEASE_TABLET | Freq: Two times a day (BID) | ORAL | Status: AC
  Administered 2020-09-22 (×2): 40 meq via ORAL
  Filled 2020-09-22 (×2): qty 2

## 2020-09-22 NOTE — Progress Notes (Signed)
PROGRESS NOTE    Vincent Campbell  KGU:[id number] DOB: [birth year] DOA: 09/20/2020 PCP: Corwin Levins, MD   Brief Narrative:  The patient is a 80 year old obese Caucasian male with a past medical history significant for but not limited to type 2 diabetes mellitus, hypertension, mild renal insufficiency, hypothyroidism, history of peripheral edema as well as other comorbidities who presented with confusion and lethargy.  He was accompanied by his wife who provided most of the history and reported that he had trouble sleeping the night of 09/19/2020 and seemed to be confused the following morning.  He was able to ambulate about the house without any obvious weakness but was much more forgetful than usual and was even having trouble using the microwave and television remote.  She noticed that he became progressively more lethargic and confused throughout the day and was found at night to be lying on the floor up against a wall and was unable to say if he fell or how he got there.  At baseline he has been more forgetful in recent months to years but continues to drive walk unassisted and perform ADLs without assistance.  Per wife he drinks liquor every other day and is not actually forthcoming about how much he drinks and she noted that he often tries to hide his drinking more.  He has had a chronic cough has not had not seem to change recently.  Upon arrival to ED he is afebrile with saturation in the low 90s.  EKG showed ectopic atrial rhythms with PACs and left anterior fascicular block.  Chest x-ray showed bibasilar opacities.  Head CT was negative for any acute findings.  He did have a leukocytosis 15.9 admission lactic acid was normal.  In the ED he was given IV Rocephin and azithromycin  **Interim History He was noted to have some left arm weakness compared to his right and wife thinks that it is because of his tremor but states that she has never been this week.  Will obtain an MRI to rule out acute CVA.   Patient's mentation is much improved and he is eating better today.  PT and OT to further evaluate and treat.  Assessment & Plan:   Principal Problem:   Encephalopathy acute Active Problems:   Essential hypertension   Diabetes (HCC)   Hypothyroidism   CKD (chronic kidney disease), stage II   Alcohol use  Acute Encephalopathy, improving -Presents with progressive lethargy and confusion over 1-2 days, had no acute findings on head CT and no appreciable focal neurologic deficithowever he did have a noticeable left arm weakness compared to the right yesterday so we will order an MRI -Possibly related to infection, excessive alcohol use, or poor sleep recently -Check TSH, ammonia, B12, folate, thiamine, blood gas, RPR, and ethanol levels -Continue supportive care, gentle IVF hydration with normal saline at 75 MLS per hour for another 1 day, empiric Rocephin and azithromycin for suspected pneumonia as below -May pursue MRI if not improving and or EEG as well as discussion with neurology -Urinalysis was unrevealing but did show 21-50 RBCs per high-power field and urine culture showed no growth -We will obtain PT OT evaluation and he had significant left arm weakness on their evaluation now on my evaluation today he continues to have some left arm weakness; wife thinks it is because of the tremor that he has on the left arm -We will obtain an MRI to rule out CVA -Follow-up on PT OT recommendations -C/w Delirium Precautions   ?  Pneumonia -Presents with lethargy and confusion and is found to have WBC 15,900 with bibasilar opacities on CXR -VBG: Labs (Brief)          Component Value Date/Time   HCO3 26.1 09/21/2020 0441   O2SAT 95.0 09/21/2020 0441     -Blood culture collected in ED and Rocephin and azithromycin started -Check procalcitonin and this was less than 0.10 and repeat was less than 10, check strep pneumo and legionella antigens, follow cultures, and continue Rocephin  and azithromycin for now -WBC is improving went from 15.9 is now 14.6 and further trending down to 12.8 -We will add guaifenesin, flutter valve, incentive spirometer and if necessary will add as needed breathing treatment -Repeat chest x-ray in the a.m.  Alcohol Use -Patient drinks liquor everyday but wife unsure of quantity, noting that he hides it from her -Alcohol level is less than 10 -Monitor for withdrawal, supplement vitamins after B12, thiamine, and folate levels drawn -He is initiated on a CIWA protocol with p.o./IV lorazepam and his mentation is improving today  CKD II -SCr is 1.23 in ED on admission, slightly up from apparent baseline -Now BUNs/creatinine is improved and normalized to 12/0.90 -Is getting IV fluid hydration as above but will now stop -Avoid nephrotoxic medications, contrast dyes, hypotension and renally adjust medications -Repeat CMP in a.m.  Type II DM -A1c was 7.7% in November -Check CBGs and use low-intensity SSI for now -CBGs ranging from 98-236 -Continue monitor blood sugars per protocol and adjust insulin regimen as necessary  Hypokalemia -Mild as patient's K+ was 3.4 -Replete with po KCl 40 mEQ BID x2 -Mag Level was 2.1 -Continue to Monitor and Replete as Necessary -Repeat CMP in the AM   Hyperbilirubinemia -Patient's T Bili went from 1.2 -> 1.4 -Likely Reactive -Continue to Monitor and Trend -Repeat CMP in the AM   Obesity -Complicates overall prognosis and care -Estimated body mass index is 30.27 kg/m as calculated from the following:   Height as of this encounter: 5\' 8"  (1.727 m).   Weight as of this encounter: 90.3 kg. -Weight Loss and Dietary Counseling given   DVT prophylaxis: Enoxaparin 40 mg sq q24h Code Status: FULL CODE  Family Communication: Discussed with Wife at Bedside  Disposition Plan: Pending further clinical improvement and evaluation by PT OT  Status is: Inpatient  Remains inpatient appropriate  because:Unsafe d/c plan, IV treatments appropriate due to intensity of illness or inability to take PO and Inpatient level of care appropriate due to severity of illness   Dispo: The patient is from: Home              Anticipated d/c is to: TBD              Patient currently is not medically stable to d/c.   Difficult to place patient No  Consultants:   None  Procedures: MRI  Antimicrobials:  Anti-infectives (From admission, onward)   Start     Dose/Rate Route Frequency Ordered Stop   09/22/20 0400  cefTRIAXone (ROCEPHIN) 2 g in sodium chloride 0.9 % 100 mL IVPB        2 g 200 mL/hr over 30 Minutes Intravenous Every 24 hours 09/21/20 0353 09/26/20 0359   09/22/20 0400  azithromycin (ZITHROMAX) 500 mg in sodium chloride 0.9 % 250 mL IVPB        500 mg 250 mL/hr over 60 Minutes Intravenous Every 24 hours 09/21/20 0353 09/26/20 0359   09/21/20 0400  cefTRIAXone (ROCEPHIN) 1 g in  sodium chloride 0.9 % 100 mL IVPB        1 g 200 mL/hr over 30 Minutes Intravenous  Once 09/21/20 0349 09/21/20 0717   09/21/20 0400  azithromycin (ZITHROMAX) 500 mg in sodium chloride 0.9 % 250 mL IVPB        500 mg 250 mL/hr over 60 Minutes Intravenous  Once 09/21/20 0349 09/21/20 0717        Subjective: Seen and examined at bedside and he is doing much better today and more alert.  Still very weak.  Wife states that he has been eating a lot better now.  Still remains hard of hearing.  Has a left arm weakness compared to right and wife thinks is from his tremor but will need to rule out CVA.  No other concerns or complaints at this time.  Objective: Vitals:   09/21/20 1714 09/21/20 2012 09/22/20 0416 09/22/20 0540  BP: (!) 161/80 (!) 155/77 (!) 187/83 (!) 176/88  Pulse: 73 73 64 (!) 56  Resp: 16 14 14    Temp: 97.8 F (36.6 C) 98.1 F (36.7 C) (!) 97.4 F (36.3 C)   TempSrc: Oral Oral Oral   SpO2: 96% 97% 94%   Weight:      Height:        Intake/Output Summary (Last 24 hours) at 09/22/2020  1443 Last data filed at 09/22/2020 0900 Gross per 24 hour  Intake 2149.7 ml  Output --  Net 2149.7 ml   Filed Weights   09/20/20 2306  Weight: 90.3 kg   Examination: Physical Exam:  Constitutional: WN/WD obese Caucasian male in NAD appears calm  Eyes: Lids and conjunctivae normal, sclerae anicteric  ENMT: External Ears, Nose appear normal.  He is hard of hearing Neck: Appears normal, supple, no cervical masses, normal ROM, no appreciable thyromegaly; no appreciable JVD Respiratory: Diminished to auscultation bilaterally, no wheezing, rales, rhonchi or crackles. Normal respiratory effort and patient is not tachypenic. No accessory muscle use.  Unlabored breathing Cardiovascular: RRR, no murmurs / rubs / gallops. S1 and S2 auscultated.  Trace extremity edema Abdomen: Soft, non-tender, distended secondary by habitus. Bowel sounds positive.  GU: Deferred. Musculoskeletal: No clubbing / cyanosis of digits/nails. No joint deformity upper and lower extremities.  Skin: No rashes, lesions, ulcers on limited skin evaluation. No induration; Warm and dry.  Neurologic: CN 2-12 grossly intact and has some left-sided weakness compared to the right specifically left arm Psychiatric: Slightly impaired judgment and insight. Alert and oriented x 2. Normal mood and appropriate affect.   Data Reviewed: I have personally reviewed following labs and imaging studies  CBC: Recent Labs  Lab 09/20/20 2325 09/21/20 0441 09/22/20 0459  WBC 15.9* 14.6* 12.8*  NEUTROABS 11.6*  --  8.2*  HGB 13.9 13.2 13.1  HCT 41.8 39.2 39.4  MCV 99.1 98.2 99.7  PLT 260 223 207   Basic Metabolic Panel: Recent Labs  Lab 09/20/20 2325 09/21/20 0441 09/22/20 0459  NA 137 137 138  K 3.6 3.5 3.4*  CL 101 102 104  CO2 25 26 23   GLUCOSE 137* 140* 107*  BUN 18 15 12   CREATININE 1.23 1.03 0.90  CALCIUM 9.2 8.7* 8.3*  MG  --   --  2.1  PHOS  --   --  2.7   GFR: Estimated Creatinine Clearance: 72.7 mL/min (by C-G  formula based on SCr of 0.9 mg/dL). Liver Function Tests: Recent Labs  Lab 09/20/20 2325 09/22/20 0459  AST 23 22  ALT 19 18  ALKPHOS 53 54  BILITOT 1.2 1.4*  PROT 7.8 6.7  ALBUMIN 4.2 3.7   No results for input(s): LIPASE, AMYLASE in the last 168 hours. Recent Labs  Lab 09/21/20 0441  AMMONIA 13   Coagulation Profile: Recent Labs  Lab 09/20/20 2325  INR 1.3*   Cardiac Enzymes: No results for input(s): CKTOTAL, CKMB, CKMBINDEX, TROPONINI in the last 168 hours. BNP (last 3 results) No results for input(s): PROBNP in the last 8760 hours. HbA1C: No results for input(s): HGBA1C in the last 72 hours. CBG: Recent Labs  Lab 09/21/20 2014 09/21/20 2356 09/22/20 0418 09/22/20 0713 09/22/20 1236  GLUCAP 121* 122* 104* 120* 236*   Lipid Profile: No results for input(s): CHOL, HDL, LDLCALC, TRIG, CHOLHDL, LDLDIRECT in the last 72 hours. Thyroid Function Tests: Recent Labs    09/21/20 0441  TSH 3.326   Anemia Panel: Recent Labs    09/21/20 0441  VITAMINB12 467  FOLATE 32.1   Sepsis Labs: Recent Labs  Lab 09/20/20 2325 09/21/20 0131 09/21/20 0441 09/22/20 0459  PROCALCITON  --   --  <0.10 <0.10  LATICACIDVEN 1.9 1.7  --   --     Recent Results (from the past 240 hour(s))  Resp Panel by RT-PCR (Flu A&B, Covid) Nasopharyngeal Swab     Status: None   Collection Time: 09/20/20 11:57 PM   Specimen: Nasopharyngeal Swab; Nasopharyngeal(NP) swabs in vial transport medium  Result Value Ref Range Status   SARS Coronavirus 2 by RT PCR NEGATIVE NEGATIVE Final    Comment: (NOTE) SARS-CoV-2 target nucleic acids are NOT DETECTED.  The SARS-CoV-2 RNA is generally detectable in upper respiratory specimens during the acute phase of infection. The lowest concentration of SARS-CoV-2 viral copies this assay can detect is 138 copies/mL. A negative result does not preclude SARS-Cov-2 infection and should not be used as the sole basis for treatment or other patient  management decisions. A negative result may occur with  improper specimen collection/handling, submission of specimen other than nasopharyngeal swab, presence of viral mutation(s) within the areas targeted by this assay, and inadequate number of viral copies(<138 copies/mL). A negative result must be combined with clinical observations, patient history, and epidemiological information. The expected result is Negative.  Fact Sheet for Patients:  BloggerCourse.com  Fact Sheet for Healthcare Providers:  SeriousBroker.it  This test is no t yet approved or cleared by the [place] FDA and  has been authorized for detection and/or diagnosis of SARS-CoV-2 by FDA under an Emergency Use Authorization (EUA). This EUA will remain  in effect (meaning this test can be used) for the duration of the COVID-19 declaration under Section 564(b)(1) of the Act, 21 U.S.C.section 360bbb-3(b)(1), unless the authorization is terminated  or revoked sooner.       Influenza A by PCR NEGATIVE NEGATIVE Final   Influenza B by PCR NEGATIVE NEGATIVE Final    Comment: (NOTE) The Xpert Xpress SARS-CoV-2/FLU/RSV plus assay is intended as an aid in the diagnosis of influenza from Nasopharyngeal swab specimens and should not be used as a sole basis for treatment. Nasal washings and aspirates are unacceptable for Xpert Xpress SARS-CoV-2/FLU/RSV testing.  Fact Sheet for Patients: BloggerCourse.com  Fact Sheet for Healthcare Providers: SeriousBroker.it  This test is not yet approved or cleared by the [place] FDA and has been authorized for detection and/or diagnosis of SARS-CoV-2 by FDA under an Emergency Use Authorization (EUA). This EUA will remain in effect (meaning this test can be used) for the duration of the  COVID-19 declaration under Section 564(b)(1) of the Act, 21 U.S.C. section 360bbb-3(b)(1),  unless the authorization is terminated or revoked.  Performed at [facility], [address], [place], [place] [address]   Urine culture     Status: None   Collection Time: 09/21/20  2:00 AM   Specimen: In/Out Cath Urine  Result Value Ref Range Status   Specimen Description   Final    IN/OUT CATH URINE Performed at [facility], [address] [place], [place] [address]    Special Requests   Final    NONE Performed at [facility], [address] [place], [place] [address]    Culture   Final    NO GROWTH Performed at [facility], [address] [place], [place] [address]    Report Status 09/22/2020 FINAL  Final    RN Pressure Injury Documentation:     Estimated body mass index is 30.27 kg/m as calculated from the following:   Height as of this encounter: 5\' 8"  (1.727 m).   Weight as of this encounter: 90.3 kg.  Malnutrition Type:   Malnutrition Characteristics:   Nutrition Interventions:    Radiology Studies: CT HEAD WO CONTRAST  Result Date: 09/21/2020 CLINICAL DATA:  Increasing weakness and altered mental status, status post fall. EXAM: CT HEAD WITHOUT CONTRAST TECHNIQUE: Contiguous axial images were obtained from the base of the skull through the vertex without intravenous contrast. COMPARISON:  May 16, 2006 FINDINGS: Brain: There is mild cerebral atrophy with widening of the extra-axial spaces and ventricular dilatation. There are areas of decreased attenuation within the white matter tracts of the supratentorial brain, consistent with microvascular disease changes. Vascular: No hyperdense vessel or unexpected calcification. Skull: Normal. Negative for fracture or focal lesion. Sinuses/Orbits: No acute finding. Other: None. IMPRESSION: 1. Generalized cerebral atrophy. 2. No acute intracranial abnormality. Electronically Signed   By: May 18, 2006 M.D.   On: 09/21/2020 01:35   CT CERVICAL SPINE  WO CONTRAST  Result Date: 09/21/2020 CLINICAL DATA:  Status post fall. EXAM: CT CERVICAL SPINE WITHOUT CONTRAST TECHNIQUE: Multidetector CT imaging of the cervical spine was performed without intravenous contrast. Multiplanar CT image reconstructions were also generated. COMPARISON:  None. FINDINGS: Alignment: Normal. Skull base and vertebrae: No acute fracture. No primary bone lesion or focal pathologic process. Soft tissues and spinal canal: No prevertebral fluid or swelling. No visible canal hematoma. Disc levels: Moderate severity endplate sclerosis is seen at the levels of C5-C6 and C6-C7. Moderate to marked severity intervertebral disc space narrowing is also seen at the levels of C5-C6 and C6-C7. Bilateral moderate severity multilevel facet joint hypertrophy is noted. Upper chest: Negative. Other: None. IMPRESSION: 1. Moderate to marked severity degenerative changes at the levels of C5-C6 and C6-C7. 2. No evidence of an acute fracture or subluxation. Electronically Signed   By: 09/23/2020 M.D.   On: 09/21/2020 01:38   DG Chest Port 1 View  Result Date: 09/20/2020 CLINICAL DATA:  Weakness and confusion. EXAM: PORTABLE CHEST 1 VIEW COMPARISON:  February 22, 2014 FINDINGS: Mild areas of atelectasis and/or infiltrate are seen within the bilateral lung bases. There is no evidence of a pleural effusion or pneumothorax. The heart size and mediastinal contours are within normal limits. The visualized skeletal structures are unremarkable. IMPRESSION: Mild bibasilar atelectasis and/or infiltrate. Electronically Signed   By: February 24, 2014 M.D.   On: 09/20/2020 23:38   Scheduled Meds: . aspirin EC  81 mg Oral Daily  .  enoxaparin (LOVENOX) injection  40 mg Subcutaneous Q24H  . folic acid  1 mg Oral Daily  . guaiFENesin  1,200 mg Oral BID  . insulin aspart  0-9 Units Subcutaneous Q4H  . LORazepam  0-4 mg Intravenous Q6H   Followed by  . [START ON 09/23/2020] LORazepam  0-4 mg Intravenous Q12H  .  multivitamin with minerals  1 tablet Oral Daily  . potassium chloride  40 mEq Oral BID  . pravastatin  40 mg Oral Daily  . thiamine  100 mg Oral Daily   Or  . thiamine  100 mg Intravenous Daily   Continuous Infusions: . azithromycin 500 mg (09/22/20 0549)  . cefTRIAXone (ROCEPHIN)  IV 2 g (09/22/20 0451)    LOS: 1 day   Merlene Laughter, DO [facility] PAGER is on AMION  If 7PM-7AM, please contact night-coverage [email or web address]

## 2020-09-22 NOTE — Evaluation (Signed)
Physical Therapy Evaluation Patient Details Name: Vincent Campbell MRN: [id number] DOB: Apr 14, 1941 Today's Date: 09/22/2020   History of Present Illness  The patient is a 80 year old obese Caucasian male with a past medical history significant for but not limited to type 2 diabetes mellitus, hypertension, mild renal insufficiency, hypothyroidism, history of peripheral edema as well as other comorbidities who presented with confusion and lethargy. At baseline he has been more forgetful in recent months to years but continues to drive and walk unassisted and perform ADLs without assistance.  Per wife he drinks liquor every other day and is not actually forthcoming about how much he drinks and she noted that he often tries to hide his drinking more.  Clinical Impression  Pt admitted with above diagnosis.  Pt currently with functional limitations due to the deficits listed below (see PT Problem List). Pt will benefit from skilled PT to increase their independence and safety with mobility to allow discharge to the venue listed below.  Pt not using L UE spontaneously which wife feels like is due to tremor or a fall on it.  With ambulation, he appeared to have decreased awareness of obstacles on the left.  If no obstacles and in straightline gait, he only needed min/guard A.  His HOH was a limiting factor during session.  They do no want SNF and requesting home with HHPT.     Follow Up Recommendations Home health PT    Equipment Recommendations  None recommended by PT    Recommendations for Other Services       Precautions / Restrictions Precautions Precautions: Fall      Mobility  Bed Mobility Overal bed mobility: Needs Assistance Bed Mobility: Supine to Sit     Supine to sit: Min guard     General bed mobility comments: Increased time to come toe EOB with HOB elevated    Transfers Overall transfer level: Needs assistance Equipment used: Rolling walker (2 wheeled) Transfers: Sit  to/from Stand Sit to Stand: Min guard         General transfer comment: min/guard for safety  Ambulation/Gait Ambulation/Gait assistance: Min guard;Min assist Gait Distance (Feet): 120 Feet Assistive device: Rolling walker (2 wheeled) Gait Pattern/deviations: Drifts right/left;Decreased step length - right;Decreased step length - left     General Gait Details: Decreased awareness of L side. Walking into doorframe with RW and trouble negotiating obstacles in hallway. But during straightpath gait, he did well with MIN/guard. Typically doesn't use RW.  Stairs            Wheelchair Mobility    Modified Rankin (Stroke Patients Only)       Balance Overall balance assessment: Needs assistance   Sitting balance-Leahy Scale: Fair       Standing balance-Leahy Scale: Fair                               Pertinent Vitals/Pain Pain Assessment: No/denies pain    Home Living Family/patient expects to be discharged to:: Private residence Living Arrangements: Spouse/significant other Available Help at Discharge: Family Type of Home: House       Home Layout: Two level Home Equipment: Environmental consultant - 2 wheels;Bedside commode      Prior Function Level of Independence: Independent               Hand Dominance   Dominant Hand: Left (but switched to right after new onset of tremors)    Extremity/Trunk Assessment  Upper Extremity Assessment Upper Extremity Assessment: Defer to OT evaluation LUE Deficits / Details: Noted that he tends not to use his L UE. No spontaneous initiation of movement.    Lower Extremity Assessment Lower Extremity Assessment: Generalized weakness    Cervical / Trunk Assessment Cervical / Trunk Assessment: Normal  Communication   Communication: HOH  Cognition Arousal/Alertness: Awake/alert Behavior During Therapy: WFL for tasks assessed/performed Overall Cognitive Status: Within Functional Limits for tasks assessed                                  General Comments: Oriented to being in the hospital and DOB. Difficult to fully assess due to severe [facility] and didn't have his hearing aid.      General Comments      Exercises     Assessment/Plan    PT Assessment Patient needs continued PT services  PT Problem List Decreased strength;Decreased activity tolerance;Decreased balance;Decreased mobility;Decreased knowledge of use of DME;Decreased safety awareness       PT Treatment Interventions DME instruction;Gait training;Stair training;Functional mobility training;Balance training;Therapeutic exercise;Therapeutic activities;Patient/family education    PT Goals (Current goals can be found in the Care Plan section)  Acute Rehab PT Goals Patient Stated Goal: to go home PT Goal Formulation: With patient/family Time For Goal Achievement: 10/05/20 Potential to Achieve Goals: Good    Frequency Min 3X/week   Barriers to discharge        Co-evaluation               AM-PAC PT "6 Clicks" Mobility  Outcome Measure Help needed turning from your back to your side while in a flat bed without using bedrails?: A Little Help needed moving from lying on your back to sitting on the side of a flat bed without using bedrails?: A Little Help needed moving to and from a bed to a chair (including a wheelchair)?: A Little Help needed standing up from a chair using your arms (e.g., wheelchair or bedside chair)?: A Little Help needed to walk in hospital room?: A Lot Help needed climbing 3-5 steps with a railing? : A Lot 6 Click Score: 16    End of Session Equipment Utilized During Treatment: Gait belt Activity Tolerance: Patient tolerated treatment well Patient left: in chair;with call bell/phone within reach;with chair alarm set;with family/visitor present Nurse Communication: Mobility status (nurse tech) PT Visit Diagnosis: Other abnormalities of gait and mobility (R26.89);Unsteadiness on feet  (R26.81)    Time: 8366-2947 PT Time Calculation (min) (ACUTE ONLY): 26 min   Charges:   PT Evaluation $PT Eval Moderate Complexity: 1 Mod PT Treatments $Gait Training: 8-22 mins        Nakya Weyand L. Katrinka Blazing, [place] Pager [phone] 09/22/2020   Enzo Montgomery 09/22/2020, 3:36 PM

## 2020-09-23 ENCOUNTER — Inpatient Hospital Stay (HOSPITAL_COMMUNITY): Payer: Medicare Other

## 2020-09-23 DIAGNOSIS — E039 Hypothyroidism, unspecified: Secondary | ICD-10-CM | POA: Diagnosis not present

## 2020-09-23 DIAGNOSIS — E1122 Type 2 diabetes mellitus with diabetic chronic kidney disease: Secondary | ICD-10-CM | POA: Diagnosis not present

## 2020-09-23 DIAGNOSIS — N182 Chronic kidney disease, stage 2 (mild): Secondary | ICD-10-CM | POA: Diagnosis not present

## 2020-09-23 DIAGNOSIS — I639 Cerebral infarction, unspecified: Secondary | ICD-10-CM

## 2020-09-23 DIAGNOSIS — Z7289 Other problems related to lifestyle: Secondary | ICD-10-CM | POA: Diagnosis not present

## 2020-09-23 DIAGNOSIS — G9340 Encephalopathy, unspecified: Secondary | ICD-10-CM

## 2020-09-23 LAB — COMPREHENSIVE METABOLIC PANEL
ALT: 18 U/L (ref 0–44)
AST: 20 U/L (ref 15–41)
Albumin: 3.4 g/dL — ABNORMAL LOW (ref 3.5–5.0)
Alkaline Phosphatase: 54 U/L (ref 38–126)
Anion gap: 10 (ref 5–15)
BUN: 11 mg/dL (ref 8–23)
CO2: 23 mmol/L (ref 22–32)
Calcium: 8.2 mg/dL — ABNORMAL LOW (ref 8.9–10.3)
Chloride: 103 mmol/L (ref 98–111)
Creatinine, Ser: 0.84 mg/dL (ref 0.61–1.24)
GFR, Estimated: >60 mL/min (ref >60)
Glucose, Bld: 122 mg/dL — ABNORMAL HIGH (ref 70–99)
Potassium: 3.9 mmol/L (ref 3.5–5.1)
Sodium: 136 mmol/L (ref 135–145)
Total Bilirubin: 1.2 mg/dL (ref 0.3–1.2)
Total Protein: 6.5 g/dL (ref 6.5–8.1)

## 2020-09-23 LAB — CBC WITH DIFFERENTIAL/PLATELET
Abs Immature Granulocytes: 0.04 10*3/uL (ref 0.00–0.07)
Basophils Absolute: 0.1 10*3/uL (ref 0.0–0.1)
Basophils Relative: 0 %
Eosinophils Absolute: 0.3 10*3/uL (ref 0.0–0.5)
Eosinophils Relative: 3 %
HCT: 40.6 % (ref 39.0–52.0)
Hemoglobin: 13.7 g/dL (ref 13.0–17.0)
Immature Granulocytes: 0 %
Lymphocytes Relative: 15 %
Lymphs Abs: 1.9 10*3/uL (ref 0.7–4.0)
MCH: 33.1 pg (ref 26.0–34.0)
MCHC: 33.7 g/dL (ref 30.0–36.0)
MCV: 98.1 fL (ref 80.0–100.0)
Monocytes Absolute: 2.1 10*3/uL — ABNORMAL HIGH (ref 0.1–1.0)
Monocytes Relative: 16 %
Neutro Abs: 8.9 10*3/uL — ABNORMAL HIGH (ref 1.7–7.7)
Neutrophils Relative %: 66 %
Platelets: 231 10*3/uL (ref 150–400)
RBC: 4.14 MIL/uL — ABNORMAL LOW (ref 4.22–5.81)
RDW: 13.8 % (ref 11.5–15.5)
WBC: 13.4 10*3/uL — ABNORMAL HIGH (ref 4.0–10.5)
nRBC: 0 % (ref 0.0–0.2)

## 2020-09-23 LAB — GLUCOSE, CAPILLARY
Glucose-Capillary: 110 mg/dL — ABNORMAL HIGH (ref 70–99)
Glucose-Capillary: 111 mg/dL — ABNORMAL HIGH (ref 70–99)
Glucose-Capillary: 123 mg/dL — ABNORMAL HIGH (ref 70–99)
Glucose-Capillary: 127 mg/dL — ABNORMAL HIGH (ref 70–99)
Glucose-Capillary: 153 mg/dL — ABNORMAL HIGH (ref 70–99)
Glucose-Capillary: 162 mg/dL — ABNORMAL HIGH (ref 70–99)

## 2020-09-23 LAB — CBC
HCT: 39.1 % (ref 39.0–52.0)
Hemoglobin: 13.7 g/dL (ref 13.0–17.0)
MCH: 33.5 pg (ref 26.0–34.0)
MCHC: 35 g/dL (ref 30.0–36.0)
MCV: 95.6 fL (ref 80.0–100.0)
Platelets: 242 10*3/uL (ref 150–400)
RBC: 4.09 MIL/uL — ABNORMAL LOW (ref 4.22–5.81)
RDW: 13.9 % (ref 11.5–15.5)
WBC: 13.1 10*3/uL — ABNORMAL HIGH (ref 4.0–10.5)
nRBC: 0 % (ref 0.0–0.2)

## 2020-09-23 LAB — HEMOGLOBIN A1C
Hgb A1c MFr Bld: 7 % — ABNORMAL HIGH (ref 4.8–5.6)
Mean Plasma Glucose: 154.2 mg/dL

## 2020-09-23 LAB — MAGNESIUM: Magnesium: 2 mg/dL (ref 1.7–2.4)

## 2020-09-23 LAB — PHOSPHORUS: Phosphorus: 2.4 mg/dL — ABNORMAL LOW (ref 2.5–4.6)

## 2020-09-23 LAB — LIPID PANEL
Cholesterol: 134 mg/dL (ref 0–200)
HDL: 56 mg/dL (ref >40)
LDL Cholesterol: 64 mg/dL (ref 0–99)
Total CHOL/HDL Ratio: 2.4 RATIO
Triglycerides: 71 mg/dL (ref <150)
VLDL: 14 mg/dL (ref 0–40)

## 2020-09-23 LAB — PROCALCITONIN: Procalcitonin: <0.1 ng/mL

## 2020-09-23 MED ORDER — K PHOS MONO-SOD PHOS DI & MONO 155-852-130 MG PO TABS
500.0000 mg | ORAL_TABLET | Freq: Once | ORAL | Status: AC
  Administered 2020-09-23: 500 mg via ORAL
  Filled 2020-09-23: qty 2

## 2020-09-23 MED ORDER — IOHEXOL 350 MG/ML SOLN
80.0000 mL | Freq: Once | INTRAVENOUS | Status: AC | PRN
  Administered 2020-09-23: 80 mL via INTRAVENOUS

## 2020-09-23 NOTE — Progress Notes (Signed)
Attempted to call report, nurse not answering her phone.

## 2020-09-23 NOTE — Consult Note (Addendum)
Neurology Consultation  CC: Confusion, scattered small subacute infarcts on MRI  History is obtained from: Chart review, patient  HPI: Vincent Campbell is a 80 y.o. male with a medical history significant for hypertension, hyperlipidemia, hypothyroidism, mild renal insufficiency, and type 2 diabetes who presented to [facility] on 2/25 for evaluation of lethargy and confusion. At home he was noted to be more confused and forgetful with trouble using the microwave and television remote and his wife noticed that he was progressively more lethargic and confused on 2/23 with trouble sleeping. He was also found on the floor by his wife on 2/23 unable to tell her if he had fallen or why he was on the floor. His ED work-up reveled oxygen saturation in the low 90s on room air, chest x-ray with bibasilar opacities, a creatinine of 1.23 but with a leukocytosis with WBC of 15,900. He was initiated on antibiotics and admitted for further work-up. An MRI was obtained 2/27 for further evaluation of confusion with evidence of scattered small subacute infarcts involving the right cerebrum, right basal ganglia, and anterior corpus callosum with chronic right basal ganglia and left corona radiata lacunar infarcts. Neurology was consulted at this time and patient was transferred to [facility] for further stroke evaluation and management.  At baseline, his wife notes that he has been progressively more confused over the past few months but is able to perform ADLs without difficulty. He drinks liquor daily at home but often underreports his drinking and hides his alcohol intake from his wife. He takes aspirin 81 mg daily at home.    LKW: 2/23 with increasing confusion, forgetfulness noted over last 2-3 months tpa given?: No, outside of thrombolytic window IR Thrombectomy? No, no LVO on imaging Modified Rankin Scale: 1-No significant post stroke disability and can perform usual duties with stroke symptoms NIHSS:  1a Level of  Conscious.: 0 1b LOC Questions: 0 1c LOC Commands: 0 2 Best Gaze: 0 3 Visual: 0 4 Facial Palsy: 0 5a Motor Arm - left: 0 5b Motor Arm - Right: 0 6a Motor Leg - Left: 0 6b Motor Leg - Right: 0 7 Limb Ataxia: 0 8 Sensory: 0 9 Best Language: 0 10 Dysarthria: 1 11 Extinct. and Inatten.: 0 TOTAL: 1  ROS: A complete ROS was performed and is negative except as noted in the HPI.   Past Medical History:  Diagnosis Date  . BACK PAIN 01/12/2008  . COLONIC POLYPS, HX OF 01/12/2008  . Cough 05/28/2010  . Encounter for well adult exam with abnormal findings 12/29/2010  . HYPERLIPIDEMIA 01/12/2008  . HYPERTENSION 01/12/2008  . Hypothyroidism 09/15/2018  . LEUKOCYTOSIS 03/02/2008  . PERIPHERAL EDEMA 06/28/2010  . SWELLING MASS OR LUMP IN HEAD AND NECK 05/28/2010  . Tremor 08/02/2012   Past Surgical History:  Procedure Laterality Date  . APPENDECTOMY    . CATARACT EXTRACTION    . CHOLECYSTECTOMY     Family History  Problem Relation Age of Onset  . Cancer Other        esophageal cancer  . Heart disease Other   . Hypertension Other    Social History:  reports that he has been smoking. He has never used smokeless tobacco. He reports current alcohol use. He reports that he does not use drugs.  Current Scheduled Medications: . aspirin EC  81 mg Oral Daily  . enoxaparin (LOVENOX) injection  40 mg Subcutaneous Q24H  . folic acid  1 mg Oral Daily  . guaiFENesin  1,200 mg  Oral BID  . insulin aspart  0-9 Units Subcutaneous Q4H  . LORazepam  0-4 mg Intravenous Q12H  . multivitamin with minerals  1 tablet Oral Daily  . pravastatin  40 mg Oral Daily  . thiamine  100 mg Oral Daily   Or  . thiamine  100 mg Intravenous Daily   Current Outpatient Medications  Medication Instructions  . aspirin 81 mg, Oral, Daily,    . Glucosamine 500 mg, Oral, Daily  . hydrochlorothiazide (MICROZIDE) 12.5 mg, Oral, Daily  . levothyroxine (SYNTHROID) 25 MCG tablet TAKE 1 TABLET BY MOUTH ONCE DAILY BEFORE BREAKFAST   . losartan (COZAAR) 100 MG tablet Take 1 tablet by mouth once daily  . losartan (COZAAR) 100 mg, Oral, Daily  . metFORMIN (GLUCOPHAGE-XR) 1,500 mg, Oral, Daily with breakfast  . pravastatin (PRAVACHOL) 40 mg, Oral, Daily  . propranolol (INDERAL) 10 mg, Oral, 2 times daily  . VITAMIN D PO 2 capsules, Oral, Daily  . ZINC CITRATE PO 1 tablet, Oral, Daily   Exam: Current vital signs: BP (!) 158/86   Pulse 81   Temp 97.6 F (36.4 C)   Resp 18   Ht 5\' 8"  (1.727 m)   Wt 90.3 kg   SpO2 96%   BMI 30.27 kg/m   Physical Exam  Constitutional: Appears well-developed and well-nourished.  Psych: Affect appropriate to situation. Cooperative with examination.  Eyes: Normal conjunctiva HENT: No OP obstruction, hearing aid in place right ear, hard of hearing at baseline Head: Normocephalic and atraumatic without obvious deformity Cardiovascular: Extremities warm, well perfused. 2+ edema in bilateral lower extremities.  Respiratory: Effort normal, non-labored breathing.  GI: Soft.  Rounded. There is no tenderness.  Skin: WDI  Neuro: Mental Status: Patient is awake, alert, oriented to person, place, month, year, and situation. He is able to state name and month correctly. Patient is able to provide some details of current history with some confusion noted. He states that he has had a stroke and when asked about weakness he states he has seen people with strokes. With further questioning, he is able to state that he has had problems with function in his left hand, arm, and leg. Follows commands with some mimic of examiner due to patient hard of hearing.  No signs of aphasia or neglect noted. Cranial Nerves: II: Visual Fields are full. Pupils are equal, round, and reactive to light 2 mm/brisk III,IV, VI: EOMI without ptosis. V: Facial sensation is symmetric to light touch.  VII: Face is symmetric resting and smiling. Subtle palmomental reflex noted.  VIII: Hearing is intact to voice, patient is  hard of hearing with hearing aid in place in the right ear X: Phonation normal.  XI: Shoulder shrug is symmetric. XII: Tongue protrudes midline. Motor: Tone is normal. Bulk is normal. 5/5 strength in right upper and lower extremities and left lower extremity. Left upper extremity with 4+/5 strength. No pronator drift noted.  Sensory: Sensation is symmetric to light touch in the arms and legs. Cerebellar: FNF and HKS are intact bilaterally   I have reviewed labs in epic and the pertinent results are: CBC    Component Value Date/Time   WBC 13.4 (H) 09/23/2020 0528   RBC 4.14 (L) 09/23/2020 0528   HGB 13.7 09/23/2020 0528   HCT 40.6 09/23/2020 0528   PLT 231 09/23/2020 0528   MCV 98.1 09/23/2020 0528   MCH 33.1 09/23/2020 0528   MCHC 33.7 09/23/2020 0528   RDW 13.8 09/23/2020 0528   LYMPHSABS  1.9 09/23/2020 0528   MONOABS 2.1 (H) 09/23/2020 0528   EOSABS 0.3 09/23/2020 0528   BASOSABS 0.1 09/23/2020 0528   CMP     Component Value Date/Time   NA 136 09/23/2020 0528   K 3.9 09/23/2020 0528   CL 103 09/23/2020 0528   CO2 23 09/23/2020 0528   GLUCOSE 122 (H) 09/23/2020 0528   BUN 11 09/23/2020 0528   CREATININE 0.84 09/23/2020 0528   CALCIUM 8.2 (L) 09/23/2020 0528   PROT 6.5 09/23/2020 0528   ALBUMIN 3.4 (L) 09/23/2020 0528   AST 20 09/23/2020 0528   ALT 18 09/23/2020 0528   ALKPHOS 54 09/23/2020 0528   BILITOT 1.2 09/23/2020 0528   GFRNONAA >60 09/23/2020 0528   GFRAA 66 (L) 11/28/2013 1238   Lab Results  Component Value Date   TSH 3.326 09/21/2020   Lab Results  Component Value Date   HGBA1C 7.2 (H) 06/18/2020   Lab Results  Component Value Date   CHOL 134 06/18/2020   HDL 54.70 06/18/2020   LDLCALC 63 06/18/2020   LDLDIRECT 116.7 05/28/2010   TRIG 83.0 06/18/2020   CHOLHDL 2 06/18/2020   I have reviewed the images obtained:  CT head 2/25:  1. Generalized cerebral atrophy. 2. No acute intracranial abnormality.  MRI Brain without contrast  2/27: Scattered small subacute infarcts involving the right cerebrum, right basal ganglia and anterior corpus callosum. Chronic right basal ganglia and left corona radiata lacunar insults. Mild chronic microvascular ischemic changes  Assessment: 80 year old male with history as above who presented to [facility] on 2/25 with progressive confusion and forgetfulness over the past 2-3 months with increasing confusion since 2/23. He was found to have leukocytosis and bibasilar opacities on chest x-ray and was initiated on antibiotic therapy. Due to ongoing confusion, an MRI was obtained revealing scattered small subacute infarcts involving the right cerebrum, right basal ganglia, and anterior corpus callosum with chronic right basal ganglia and corona radiata lacunar insults and mild chronic microvascular ischemic changes.  - Examination is significant for mild left upper extremity weakness and some evidence of confusion.  - Suspect cognitive decline along with subacute strokes contributing to confusion with subtle palmomental reflex noted on examination and progression of confusion over 2-3 months. - CT angio head and neck pending for vessel imaging.  Suspect right carotid stenosis due to the pattern of infarcts on the MRI.  Recommendations: - HgbA1c, fasting lipid panel - CT angio head and neck- Creatinine 0.84 currently  - Frequent neuro checks - Echocardiogram - Prophylactic therapy- Antiplatelet med: Aspirin - home dose 81 mg daily with clopidogrel 75 mg daily for 3 weeks followed by aspirin monotherapy - Risk factor modification - Telemetry monitoring - PT consult, OT consult, Speech consult - Stroke team to follow   Lanae Boast, AGAC-NP [facility] Pager: [phone]  Attending Neurohospitalist Addendum Patient seen and examined with APP/Resident. Agree with the history and physical as documented above. Agree with the plan as documented, which I helped formulate. I have  independently reviewed the chart, obtained history, review of systems and examined the patient.I have personally reviewed pertinent head/neck/spine imaging (CT/MRI). Please feel free to call with any questions.  -- Milon Dikes, MD Neurologist [facility] Pager: [phone]

## 2020-09-23 NOTE — Progress Notes (Signed)
MRI was finally done today and I got a call from the radiologist that was reading it.  The MRI shows small scattered subacute infarcts involving the right cerebrum, right basal ganglia and anterior corpus callosum.  Likely this is embolic in nature so I spoke with the neurologist on-call Dr. Milon Dikes who recommends further stroke work-up and sending the patient to [facility] for further neurological evaluation.  Echo has been ordered and will likely need a CT of the head neck as well as carotid Dopplers.  Dr. Wilford Corner to be notified for formal consultation once the patient arrives and signout was given to Dr. Lorin Glass in case anything happens with the patient if he decompensates.

## 2020-09-23 NOTE — Care Plan (Signed)
Reviewed CTA angiogram.  Right carotid stenosis as suspected by Dr. Jerrell Belfast is confirmed.  Patient will need vascular surgery evaluation in the morning.  Brooke Dare MD-PhD [facility] [phone] Available 7 PM to 7 AM, outside of these hours please call Neurologist on call as listed on Amion.

## 2020-09-23 NOTE — Progress Notes (Signed)
Report called to [facility] west, pt transferred via [facility]. Wife knows of patient being transferred and what the new room number is.

## 2020-09-23 NOTE — Progress Notes (Signed)
PROGRESS NOTE    Ula LingoJames W Schreier  AVW:[id number] DOB: April 13, 1941 DOA: 09/20/2020 PCP: Corwin LevinsJohn, Izaan W, MD   Brief Narrative:  The patient is a 80 year old obese Caucasian male with a past medical history significant for but not limited to type 2 diabetes mellitus, hypertension, mild renal insufficiency, hypothyroidism, history of peripheral edema as well as other comorbidities who presented with confusion and lethargy.  He was accompanied by his wife who provided most of the history and reported that he had trouble sleeping the night of 09/19/2020 and seemed to be confused the following morning.  He was able to ambulate about the house without any obvious weakness but was much more forgetful than usual and was even having trouble using the microwave and television remote.  She noticed that he became progressively more lethargic and confused throughout the day and was found at night to be lying on the floor up against a wall and was unable to say if he fell or how he got there.  At baseline he has been more forgetful in recent months to years but continues to drive walk unassisted and perform ADLs without assistance.  Per wife he drinks liquor every other day and is not actually forthcoming about how much he drinks and she noted that he often tries to hide his drinking more.  He has had a chronic cough has not had not seem to change recently.  Upon arrival to ED he is afebrile with saturation in the low 90s.  EKG showed ectopic atrial rhythms with PACs and left anterior fascicular block.  Chest x-ray showed bibasilar opacities.  Head CT was negative for any acute findings.  He did have a leukocytosis 15.9 admission lactic acid was normal.  In the ED he was given IV Rocephin and azithromycin  **Interim History He was noted to have some left arm weakness compared to his right and wife thinks that it is because of his tremor but states that she has never been this weak.  Will obtain an MRI to rule out acute CVA  and this is still pending to be done.  Patient's mentation is much improved since admission and wife feels that he is close to baseline.  PT and OT to further evaluate and treat and recommending home health at discharge.  WBC slightly elevated so we will continue monitor and repeat chest x-ray today.  We will follow up on his MRI.  Anticipating discharge in the next 24 to 48 hours if stable and if MRI is unrevealing.  Assessment & Plan:   Principal Problem:   Encephalopathy acute Active Problems:   Essential hypertension   Diabetes (HCC)   Hypothyroidism   CKD (chronic kidney disease), stage II   Alcohol use  Acute Encephalopathy, improving and at his baseline now -Presents with progressive lethargy and confusion over 1-2 days, had no acute findings on head CT and no appreciable focal neurologic deficithowever he did have a noticeable left arm weakness compared to the right yesterday so we will order an MRI -Possibly related to infection, excessive alcohol use, or poor sleep recently -Check TSH, ammonia, B12, folate, thiamine, blood gas, RPR, and ethanol levels -Continue supportive care, gentle IVF hydration with normal saline at 75 MLS per hour for another 1 day, empiric Rocephin and azithromycin for suspected pneumonia as below -May pursue MRI if not improving and or EEG as well as discussion with neurology -Urinalysis was unrevealing but did show 21-50 RBCs per high-power field and urine culture showed no  growth -We will obtain PT OT evaluation and he had significant left arm weakness on their evaluation now on my evaluation today he continues to have some left arm weakness; wife thinks it is because of the tremor that he has on the left arm -We will obtain an MRI to rule out CVA which is still pending to be done still -Follow-up on PT OT recommendations and they are recommending home health PT and OT given that family does not want SNF -C/w Delirium Precautions    ?Pneumonia -Presents with lethargy and confusion and is found to have WBC 15,900 with bibasilar opacities on CXR -VBG: Labs (Brief)          Component Value Date/Time   HCO3 26.1 09/21/2020 0441   O2SAT 95.0 09/21/2020 0441     -Blood culture collected in ED and Rocephin and azithromycin started; continuing antibiotics for now -Check procalcitonin and this was less than 0.10 x3, check strep pneumo and legionella antigens, follow cultures, and continue Rocephin and azithromycin for now -WBC is improving went from 15.9 is now 14.6 and further trending down to 12.8 yesterday but slightly bumped and went to 13.4 this morning -We will add guaifenesin, flutter valve, incentive spirometer and if necessary will add as needed breathing treatment -Repeat chest x-ray this a.m. is still pending; will need an ambulatory home O2 screen prior to discharge  Alcohol Use -Patient drinks liquor everyday but wife unsure of quantity, noting that he hides it from her -Alcohol level is less than 10 -Monitor for withdrawal, supplement vitamins after B12, thiamine, and folate levels drawn -He is initiated on a CIWA protocol with p.o./IV lorazepam and his mentation is improving today  CKD II -SCr is 1.23 in ED on admission, slightly up from apparent baseline -Now BUNs/creatinine is improved and normalized to 11/0.84 -Is getting IV fluid hydration as above but will now stop -Avoid nephrotoxic medications, contrast dyes, hypotension and renally adjust medications -Repeat CMP in a.m.  Type II DM -A1c was 7.7% in November -Check CBGs and use low-intensity SSI for now -CBGs ranging from 123-236 -Continue monitor blood sugars per protocol and adjust insulin regimen as necessary  Hypokalemia -Mild as patient's K+ was 3.4 and is now 3.9 -Replete with po KCl 40 mEQ BID x2 yesterday -Mag Level was 2.0 -Continue to Monitor and Replete as Necessary -Repeat CMP in the AM    Hypophosphatemia -Patient's phosphorus level was 2.4 -Replete with p.o. K-Phos Neutral 500 mg x 1 -Continue to monitor and replete as necessary -Repeat Phos level in the a.m.  Hyperbilirubinemia -Patient's T Bili went from 1.2 -> 1.4 and today is 1.2 -Likely Reactive -Continue to Monitor and Trend -Repeat CMP in the AM   Obesity -Complicates overall prognosis and care -Estimated body mass index is 30.27 kg/m as calculated from the following:   Height as of this encounter: 5\' 8"  (1.727 m).   Weight as of this encounter: 90.3 kg. -Weight Loss and Dietary Counseling given   DVT prophylaxis: Enoxaparin 40 mg sq q24h Code Status: FULL CODE  Family Communication: Discussed with Wife at Bedside  Disposition Plan: Pending further clinical improvement and evaluation by PT OT  Status is: Inpatient  Remains inpatient appropriate because:Unsafe d/c plan, IV treatments appropriate due to intensity of illness or inability to take PO and Inpatient level of care appropriate due to severity of illness   Dispo: The patient is from: Home              Anticipated  d/c is to: TBD              Patient currently is not medically stable to d/c.   Difficult to place patient No  Consultants:   None  Procedures: MRI ordered and pending to be done  Antimicrobials:  Anti-infectives (From admission, onward)   Start     Dose/Rate Route Frequency Ordered Stop   09/22/20 0400  cefTRIAXone (ROCEPHIN) 2 g in sodium chloride 0.9 % 100 mL IVPB        2 g 200 mL/hr over 30 Minutes Intravenous Every 24 hours 09/21/20 0353 09/26/20 0359   09/22/20 0400  azithromycin (ZITHROMAX) 500 mg in sodium chloride 0.9 % 250 mL IVPB        500 mg 250 mL/hr over 60 Minutes Intravenous Every 24 hours 09/21/20 0353 09/26/20 0359   09/21/20 0400  cefTRIAXone (ROCEPHIN) 1 g in sodium chloride 0.9 % 100 mL IVPB        1 g 200 mL/hr over 30 Minutes Intravenous  Once 09/21/20 0349 09/21/20 0717   09/21/20 0400   azithromycin (ZITHROMAX) 500 mg in sodium chloride 0.9 % 250 mL IVPB        500 mg 250 mL/hr over 60 Minutes Intravenous  Once 09/21/20 0349 09/21/20 0717        Subjective: Seen and examined at bedside and he is much more improved and wife thinks he is close to his baseline.  Continues to remain hard of hearing.  Left arm weakness is not as pronounced today as it was yesterday.  MRI is still pending to be done.  He denies any chest pain or shortness of breath and feels well.  Hopeful to go home soon.  Will wait for the MRI to get done and if this is negative will anticipate discharging home in next 24 to 48 hours.  Objective: Vitals:   09/22/20 0540 09/22/20 1456 09/22/20 2121 09/23/20 0523  BP: (!) 176/88 (!) 147/81 (!) 149/85 (!) 143/85  Pulse: (!) 56 72 78 88  Resp:  Temp:  (!) 97.5 F (36.4 C) 98.2 F (36.8 C) (!) 97.4 F (36.3 C)  TempSrc:  Oral Oral Oral  SpO2:  99% 96% 96%  Weight:      Height:        Intake/Output Summary (Last 24 hours) at 09/23/2020 1236 Last data filed at 09/23/2020 0830 Gross per 24 hour  Intake 948 ml  Output --  Net 948 ml   Filed Weights   09/20/20 2306  Weight: 90.3 kg   Examination: Physical Exam:  Constitutional: WN/WD obese Caucasian male currently in no acute distress appears calm Eyes: Lids and conjunctivae normal, sclerae anicteric  ENMT: External Ears, Nose appear normal.  Still very hard of hearing Neck: Appears normal, supple, no cervical masses, normal ROM, no appreciable thyromegaly; no appreciable JVD Respiratory: Diminished to auscultation bilaterally with coarse breath sounds, no wheezing, rales, rhonchi or crackles. Normal respiratory effort and patient is not tachypenic. No accessory muscle use.  Wearing supplemental oxygen via nasal cannula Cardiovascular: RRR, no murmurs / rubs / gallops. S1 and S2 auscultated.  Has trace lower extremity edema Abdomen: Soft, non-tender, distended secondary to body habitus. Bowel  sounds positive.  GU: Deferred. Musculoskeletal: No clubbing / cyanosis of digits/nails. No joint deformity upper and lower extremities.  Skin: No rashes, lesions, ulcers or limited skin evaluation. No induration; Warm and dry.  Neurologic: CN 2-12 grossly intact with no focal deficits.  Continues to have some left arm weakness but not as much as yesterday and this is improved.  Romberg sign and cerebellar reflexes not assessed.  Psychiatric: Normal judgment and insight. Alert and oriented x 3. Normal mood and appropriate affect.   Data Reviewed: I have personally reviewed following labs and imaging studies  CBC: Recent Labs  Lab 09/20/20 2325 09/21/20 0441 09/22/20 0459 09/23/20 0528  WBC 15.9* 14.6* 12.8* 13.4*  NEUTROABS 11.6*  --  8.2* 8.9*  HGB 13.9 13.2 13.1 13.7  HCT 41.8 39.2 39.4 40.6  MCV 99.1 98.2 99.7 98.1  PLT 260 223 207 231   Basic Metabolic Panel: Recent Labs  Lab 09/20/20 2325 09/21/20 0441 09/22/20 0459 09/23/20 0528  NA 137 137 138 136  K 3.6 3.5 3.4* 3.9  CL 101 102 104 103  CO2 25 26 23 23   GLUCOSE 137* 140* 107* 122*  BUN 18 15 12 11   CREATININE 1.23 1.03 0.90 0.84  CALCIUM 9.2 8.7* 8.3* 8.2*  MG  --   --  2.1 2.0  PHOS  --   --  2.7 2.4*   GFR: Estimated Creatinine Clearance: 77.9 mL/min (by C-G formula based on SCr of 0.84 mg/dL). Liver Function Tests: Recent Labs  Lab 09/20/20 2325 09/22/20 0459 09/23/20 0528  AST 23 22 20   ALT 19 18 18   ALKPHOS 53 54 54  BILITOT 1.2 1.4* 1.2  PROT 7.8 6.7 6.5  ALBUMIN 4.2 3.7 3.4*   No results for input(s): LIPASE, AMYLASE in the last 168 hours. Recent Labs  Lab 09/21/20 0441  AMMONIA 13   Coagulation Profile: Recent Labs  Lab 09/20/20 2325  INR 1.3*   Cardiac Enzymes: No results for input(s): CKTOTAL, CKMB, CKMBINDEX, TROPONINI in the last 168 hours. BNP (last 3 results) No results for input(s): PROBNP in the last 8760 hours. HbA1C: No results for input(s): HGBA1C in the last 72  hours. CBG: Recent Labs  Lab 09/22/20 2013 09/22/20 2324 09/23/20 0410 09/23/20 0723 09/23/20 1146  GLUCAP 130* 103* 123* 127* 153*   Lipid Profile: No results for input(s): CHOL, HDL, LDLCALC, TRIG, CHOLHDL, LDLDIRECT in the last 72 hours. Thyroid Function Tests: Recent Labs    09/21/20 0441  TSH 3.326   Anemia Panel: Recent Labs    09/21/20 0441  VITAMINB12 467  FOLATE 32.1   Sepsis Labs: Recent Labs  Lab 09/20/20 2325 09/21/20 0131 09/21/20 0441 09/22/20 0459 09/23/20 0528  PROCALCITON  --   --  <0.10 <0.10 <0.10  LATICACIDVEN 1.9 1.7  --   --   --     Recent Results (from the past 240 hour(s))  Blood culture (routine single)     Status: None (Preliminary result)   Collection Time: 09/20/20 11:25 PM   Specimen: BLOOD  Result Value Ref Range Status   Specimen Description   Final    BLOOD SPECIMEN SOURCE NOT MARKED ON REQUISITION Performed at [facility], [address] [place], [place]    Special Requests   Final    BOTTLES DRAWN AEROBIC AND ANAEROBIC Blood Culture results may not be optimal due to an inadequate volume of blood received in culture bottles Performed at [facility], [address] [place], [place]    Culture   Final    NO GROWTH 2 DAYS Performed at [facility], [address], [place], [address] [facility]    Report Status PENDING  Incomplete  Resp Panel by RT-PCR (Flu  A&B, Covid) Nasopharyngeal Swab     Status: None   Collection Time: 09/20/20 11:57 PM   Specimen: Nasopharyngeal Swab; Nasopharyngeal(NP) swabs in vial transport medium  Result Value Ref Range Status   SARS Coronavirus 2 by RT PCR NEGATIVE NEGATIVE Final    Comment: (NOTE) SARS-CoV-2 target nucleic acids are NOT DETECTED.  The SARS-CoV-2 RNA is generally detectable in upper respiratory specimens during the acute phase of infection. The lowest concentration of SARS-CoV-2 viral copies this assay can detect  is 138 copies/mL. A negative result does not preclude SARS-Cov-2 infection and should not be used as the sole basis for treatment or other patient management decisions. A negative result may occur with  improper specimen collection/handling, submission of specimen other than nasopharyngeal swab, presence of viral mutation(s) within the areas targeted by this assay, and inadequate number of viral copies(<138 copies/mL). A negative result must be combined with clinical observations, patient history, and epidemiological information. The expected result is Negative.  Fact Sheet for Patients:  BloggerCourse.com  Fact Sheet for Healthcare Providers:  SeriousBroker.it  This test is no t yet approved or cleared by the [place] FDA and  has been authorized for detection and/or diagnosis of SARS-CoV-2 by FDA under an Emergency Use Authorization (EUA). This EUA will remain  in effect (meaning this test can be used) for the duration of the COVID-19 declaration under Section 564(b)(1) of the Act, 21 U.S.C.section 360bbb-3(b)(1), unless the authorization is terminated  or revoked sooner.       Influenza A by PCR NEGATIVE NEGATIVE Final   Influenza B by PCR NEGATIVE NEGATIVE Final    Comment: (NOTE) The Xpert Xpress SARS-CoV-2/FLU/RSV plus assay is intended as an aid in the diagnosis of influenza from Nasopharyngeal swab specimens and should not be used as a sole basis for treatment. Nasal washings and aspirates are unacceptable for Xpert Xpress SARS-CoV-2/FLU/RSV testing.  Fact Sheet for Patients: BloggerCourse.com  Fact Sheet for Healthcare Providers: SeriousBroker.it  This test is not yet approved or cleared by the [place] FDA and has been authorized for detection and/or diagnosis of SARS-CoV-2 by FDA under an Emergency Use Authorization (EUA). This EUA will remain in effect  (meaning this test can be used) for the duration of the COVID-19 declaration under Section 564(b)(1) of the Act, 21 U.S.C. section 360bbb-3(b)(1), unless the authorization is terminated or revoked.  Performed at [facility], [address], [place], [place] [address]   Urine culture     Status: None   Collection Time: 09/21/20  2:00 AM   Specimen: In/Out Cath Urine  Result Value Ref Range Status   Specimen Description   Final    IN/OUT CATH URINE Performed at [facility], [address] [place], [place] [address]    Special Requests   Final    NONE Performed at [facility], [address], [place], [place] [address]    Culture   Final    NO GROWTH Performed at [facility], [address], [place], [place] [address]    Report Status 09/22/2020 FINAL  Final    RN Pressure Injury Documentation:     Estimated body mass index is 30.27 kg/m as calculated from the following:   Height as of this encounter: 5\' 8"  (1.727 m).   Weight as of this encounter: 90.3 kg.  Malnutrition Type:   Malnutrition Characteristics:   Nutrition Interventions:    Radiology Studies: No results found. Scheduled Meds: . aspirin  EC  81 mg Oral Daily  . enoxaparin (LOVENOX) injection  40 mg Subcutaneous Q24H  . folic acid  1 mg Oral Daily  . guaiFENesin  1,200 mg Oral BID  . insulin aspart  0-9 Units Subcutaneous Q4H  . LORazepam  0-4 mg Intravenous Q12H  . multivitamin with minerals  1 tablet Oral Daily  . pravastatin  40 mg Oral Daily  . thiamine  100 mg Oral Daily   Or  . thiamine  100 mg Intravenous Daily   Continuous Infusions: . azithromycin 500 mg (09/23/20 0451)  . cefTRIAXone (ROCEPHIN)  IV 2 g (09/23/20 0403)    LOS: 2 days   Merlene Laughter, DO [facility] PAGER is on AMION  If 7PM-7AM, please contact night-coverage [email or web address]

## 2020-09-24 ENCOUNTER — Inpatient Hospital Stay (HOSPITAL_COMMUNITY): Payer: Medicare Other

## 2020-09-24 DIAGNOSIS — I6330 Cerebral infarction due to thrombosis of unspecified cerebral artery: Secondary | ICD-10-CM | POA: Insufficient documentation

## 2020-09-24 DIAGNOSIS — I6521 Occlusion and stenosis of right carotid artery: Secondary | ICD-10-CM

## 2020-09-24 DIAGNOSIS — I10 Essential (primary) hypertension: Secondary | ICD-10-CM | POA: Diagnosis not present

## 2020-09-24 DIAGNOSIS — I6389 Other cerebral infarction: Secondary | ICD-10-CM

## 2020-09-24 DIAGNOSIS — I639 Cerebral infarction, unspecified: Secondary | ICD-10-CM

## 2020-09-24 LAB — ECHOCARDIOGRAM COMPLETE
Area-P 1/2: 4.15 cm2
Height: 68 in
S' Lateral: 3 cm
Weight: 3185.21 oz

## 2020-09-24 LAB — GLUCOSE, CAPILLARY
Glucose-Capillary: 128 mg/dL — ABNORMAL HIGH (ref 70–99)
Glucose-Capillary: 144 mg/dL — ABNORMAL HIGH (ref 70–99)
Glucose-Capillary: 186 mg/dL — ABNORMAL HIGH (ref 70–99)
Glucose-Capillary: 102 mg/dL — ABNORMAL HIGH (ref 70–99)
Glucose-Capillary: 121 mg/dL — ABNORMAL HIGH (ref 70–99)

## 2020-09-24 LAB — LEGIONELLA PNEUMOPHILA SEROGP 1 UR AG: L. pneumophila Serogp 1 Ur Ag: NEGATIVE

## 2020-09-24 MED ORDER — CLOPIDOGREL BISULFATE 75 MG PO TABS
75.0000 mg | ORAL_TABLET | Freq: Every day | ORAL | Status: DC
  Administered 2020-09-24 – 2020-09-27 (×3): 75 mg via ORAL
  Filled 2020-09-24 (×3): qty 1

## 2020-09-24 MED ORDER — PERFLUTREN LIPID MICROSPHERE
1.0000 mL | INTRAVENOUS | Status: AC | PRN
  Administered 2020-09-24: 2 mL via INTRAVENOUS
  Filled 2020-09-24: qty 10

## 2020-09-24 NOTE — Progress Notes (Signed)
PROGRESS NOTE    Vincent Campbell  WGN:[id number] DOB: 01-16-1941 DOA: 09/20/2020 PCP: Corwin Levins, MD   Brief Narrative:  The patient is a 80 year old obese Caucasian male with a past medical history significant for but not limited to type 2 diabetes mellitus, hypertension, mild renal insufficiency, hypothyroidism, history of peripheral edema as well as other comorbidities who presented with confusion and lethargy. Presents with worsening confusion, lethargy and difficulty ambulating.   Patient noted to have unilateral left weakness, transferred to [facility] for further evaluation, MRI showing scattered subacute infarcts on the 27th, neurology consulted for further evaluation and treatment.  Assessment & Plan:   Principal Problem:   Encephalopathy acute Active Problems:   Essential hypertension   Diabetes (HCC)   Hypothyroidism   CKD (chronic kidney disease), stage II   Alcohol use  Acute/subacute CVA confirmed on MRI  Right internal carotid artery stenosis -Neurology following, vascular consulted -MRI shows scattered subacute infarcts with right carotid artery stenosis, vascular to evaluate for possible stent placement -Patient's weakness appears to be improving per family at bedside -Echocardiogram -6065% EF without regional wall abnormalities without signs of intracardiac embolism. -CTA head/neck shows small multifocal right cerebral infarcts; 90% proximal right ICA narrowing, left 50%  Acute metabolic encephalopathy, likely multifactorial On chronic likely progressive dementia -Questionably secondary to above -Patient also has profound history of alcohol use, CIWA protocol ongoing, patient not currently scoring -Rule out possible infection as below -continue rocephin and azithromycin for suspected pneumonia  Alcohol Use, without withdrawals -Per wife patient drinks liquor every day, unclear amount; concerned that he may be hiding how much he truly drinks from her per previous  discussion  -Alcohol level negative at intake, follow for withdrawals, currently not scoring   Mild AKI on CKD II -SCr is 1.23 in ED on admission, slightly up from apparent baseline -Creatinine normalizing over the past 24 hours  Type II DM, uncontrolled with hyperglycemia -Previous A1c was 7.7% -Check CBGs and use low-intensity SSI for now -CBGs ranging from 123-236 -Continue monitor blood sugars per protocol and adjust insulin regimen as necessary  Mild hypokalemia, resolved -Currently within normal limits  Hypophosphatemia -In the setting of poor p.o. intake, continue to increase diet as tolerated -Likely due to chronic alcohol use as above  Hyperbilirubinemia, resolved -Currently within normal limits  Obesity -Complicates overall prognosis and care -Estimated body mass index is 30.27 kg/m as calculated from the following:   Height as of this encounter:  (1.727 m).   Weight as of this encounter: 90.3 kg. -Weight Loss and Dietary Counseling given   DVT prophylaxis: Enoxaparin 40 mg sq q24h Code Status: FULL CODE  Family Communication: Discussed with Wife at Bedside at length Disposition Plan: Pending further clinical improvement and evaluation by PT OT  Status is: Inpatient  Remains inpatient appropriate because:Unsafe d/c plan, IV treatments appropriate due to intensity of illness or inability to take PO and Inpatient level of care appropriate due to severity of illness   Dispo: The patient is from: Home              Anticipated d/c is to: TBD              Patient currently is not medically stable to d/c.   Difficult to place patient No  Consultants:   None  Procedures:  Tentatively planned right carotid stent  Antimicrobials:  Anti-infectives (From admission, onward)   Start     Dose/Rate Route Frequency Ordered Stop   09/22/20  0400  cefTRIAXone (ROCEPHIN) 2 g in sodium chloride 0.9 % 100 mL IVPB        2 g 200 mL/hr over 30 Minutes Intravenous  Every 24 hours 09/21/20 0353 09/26/20 0359   09/22/20 0400  azithromycin (ZITHROMAX) 500 mg in sodium chloride 0.9 % 250 mL IVPB        500 mg 250 mL/hr over 60 Minutes Intravenous Every 24 hours 09/21/20 0353 09/26/20 0359   09/21/20 0400  cefTRIAXone (ROCEPHIN) 1 g in sodium chloride 0.9 % 100 mL IVPB        1 g 200 mL/hr over 30 Minutes Intravenous  Once 09/21/20 0349 09/21/20 0717   09/21/20 0400  azithromycin (ZITHROMAX) 500 mg in sodium chloride 0.9 % 250 mL IVPB        500 mg 250 mL/hr over 60 Minutes Intravenous  Once 09/21/20 0349 09/21/20 0717        Subjective: No acute issues or events overnight, patient transferred to [facility] without any incident or delays.  Patient now evaluated by neurology and vascular surgery, denies nausea vomiting diarrhea constipation headache fevers or chills.  Weakness appears to be resolving although review of systems somewhat limited due to patient's limited hearing and likely underlying dementia.  Wife is primary point of contact and at bedside this morning states patient appears quite well.  Objective: Vitals:   09/23/20 2019 09/23/20 2354 09/24/20 0000 09/24/20 0350  BP: (!) 141/86 (!) 151/88  (!) 154/87  Pulse: 81 84 84 94  Resp: 18 18    Temp: 97.7 F (36.5 C) 98 F (36.7 C)  97.9 F (36.6 C)  TempSrc:      SpO2: 95% 95%  94%  Weight:      Height:        Intake/Output Summary (Last 24 hours) at 09/24/2020 0719 Last data filed at 09/23/2020 0830 Gross per 24 hour  Intake 240 ml  Output -  Net 240 ml   Filed Weights   09/20/20 2306  Weight: 90.3 kg   Examination: Physical Exam:  General:  Pleasantly resting in bed, No acute distress. HEENT:  Normocephalic atraumatic.  Sclerae nonicteric, noninjected.  Extraocular movements intact bilaterally. Neck:  Without mass or deformity.  Trachea is midline. Lungs:  Clear to auscultate bilaterally without rhonchi, wheeze, or rales. Heart:  Regular rate and rhythm.  Without murmurs,  rubs, or gallops. Abdomen:  Soft, nontender, nondistended.  Without guarding or rebound. Extremities: Without cyanosis, clubbing, edema, or obvious deformity. Neuro: 4 out of 5 strength globally without overt deficits, patient denies any sensory deficits at this point Vascular:  Dorsalis pedis and posterior tibial pulses palpable bilaterally. Skin:  Warm and dry, no erythema, no ulcerations.   Data Reviewed: I have personally reviewed following labs and imaging studies  CBC: Recent Labs  Lab 09/20/20 2325 09/21/20 0441 09/22/20 0459 09/23/20 0528 09/23/20 1654  WBC 15.9* 14.6* 12.8* 13.4* 13.1*  NEUTROABS 11.6*  --  8.2* 8.9*  --   HGB 13.9 13.2 13.1 13.7 13.7  HCT 41.8 39.2 39.4 40.6 39.1  MCV 99.1 98.2 99.7 98.1 95.6  PLT 260 223 207 231 242   Basic Metabolic Panel: Recent Labs  Lab 09/20/20 2325 09/21/20 0441 09/22/20 0459 09/23/20 0528  NA 137 137 138 136  K 3.6 3.5 3.4* 3.9  CL 101 102 104 103  CO2 25 26 23 23   GLUCOSE 137* 140* 107* 122*  BUN 18 15 12 11   CREATININE 1.23 1.03 0.90 0.84  CALCIUM 9.2 8.7* 8.3* 8.2*  MG  --   --  2.1 2.0  PHOS  --   --  2.7 2.4*   GFR: Estimated Creatinine Clearance: 77.9 mL/min (by C-G formula based on SCr of 0.84 mg/dL). Liver Function Tests: Recent Labs  Lab 09/20/20 2325 09/22/20 0459 09/23/20 0528  AST 23 22 20   ALT 19 18 18   ALKPHOS 53 54 54  BILITOT 1.2 1.4* 1.2  PROT 7.8 6.7 6.5  ALBUMIN 4.2 3.7 3.4*   No results for input(s): LIPASE, AMYLASE in the last 168 hours. Recent Labs  Lab 09/21/20 0441  AMMONIA 13   Coagulation Profile: Recent Labs  Lab 09/20/20 2325  INR 1.3*   Cardiac Enzymes: No results for input(s): CKTOTAL, CKMB, CKMBINDEX, TROPONINI in the last 168 hours. BNP (last 3 results) No results for input(s): PROBNP in the last 8760 hours. HbA1C: Recent Labs    09/23/20 1654  HGBA1C 7.0*   CBG: Recent Labs  Lab 09/23/20 1146 09/23/20 1555 09/23/20 2020 09/23/20 2354 09/24/20 0400   GLUCAP 153* 110* 162* 111* 128*   Lipid Profile: Recent Labs    09/23/20 1654  CHOL 134  HDL 56  LDLCALC 64  TRIG 71  CHOLHDL 2.4   Thyroid Function Tests: No results for input(s): TSH, T4TOTAL, FREET4, T3FREE, THYROIDAB in the last 72 hours. Anemia Panel: No results for input(s): VITAMINB12, FOLATE, FERRITIN, TIBC, IRON, RETICCTPCT in the last 72 hours. Sepsis Labs: Recent Labs  Lab 09/20/20 2325 09/21/20 0131 09/21/20 0441 09/22/20 0459 09/23/20 0528  PROCALCITON  --   --  <0.10 <0.10 <0.10  LATICACIDVEN 1.9 1.7  --   --   --     Recent Results (from the past 240 hour(s))  Blood culture (routine single)     Status: None (Preliminary result)   Collection Time: 09/20/20 11:25 PM   Specimen: BLOOD  Result Value Ref Range Status   Specimen Description   Final    BLOOD SPECIMEN SOURCE NOT MARKED ON REQUISITION Performed at [facility], [address], [place], [place]    Special Requests   Final    BOTTLES DRAWN AEROBIC AND ANAEROBIC Blood Culture results may not be optimal due to an inadequate volume of blood received in culture bottles Performed at [facility], [address], [place], [place]    Culture   Final    NO GROWTH 2 DAYS Performed at [facility], [address], [place], [address] [place]    Report Status PENDING  Incomplete  Resp Panel by RT-PCR (Flu A&B, Covid) Nasopharyngeal Swab     Status: None   Collection Time: 09/20/20 11:57 PM   Specimen: Nasopharyngeal Swab; Nasopharyngeal(NP) swabs in vial transport medium  Result Value Ref Range Status   SARS Coronavirus 2 by RT PCR NEGATIVE NEGATIVE Final    Comment: (NOTE) SARS-CoV-2 target nucleic acids are NOT DETECTED.  The SARS-CoV-2 RNA is generally detectable in upper respiratory specimens during the acute phase of infection. The lowest concentration of SARS-CoV-2 viral copies this assay can detect is 138 copies/mL. A negative  result does not preclude SARS-Cov-2 infection and should not be used as the sole basis for treatment or other patient management decisions. A negative result may occur with  improper specimen collection/handling, submission of specimen other than nasopharyngeal swab, presence of viral mutation(s) within the areas targeted by this assay, and inadequate number of viral copies(<138 copies/mL). A negative result must be  combined with clinical observations, patient history, and epidemiological information. The expected result is Negative.  Fact Sheet for Patients:  BloggerCourse.com  Fact Sheet for Healthcare Providers:  SeriousBroker.it  This test is no t yet approved or cleared by the [place] FDA and  has been authorized for detection and/or diagnosis of SARS-CoV-2 by FDA under an Emergency Use Authorization (EUA). This EUA will remain  in effect (meaning this test can be used) for the duration of the COVID-19 declaration under Section 564(b)(1) of the Act, 21 U.S.C.section 360bbb-3(b)(1), unless the authorization is terminated  or revoked sooner.       Influenza A by PCR NEGATIVE NEGATIVE Final   Influenza B by PCR NEGATIVE NEGATIVE Final    Comment: (NOTE) The Xpert Xpress SARS-CoV-2/FLU/RSV plus assay is intended as an aid in the diagnosis of influenza from Nasopharyngeal swab specimens and should not be used as a sole basis for treatment. Nasal washings and aspirates are unacceptable for Xpert Xpress SARS-CoV-2/FLU/RSV testing.  Fact Sheet for Patients: BloggerCourse.com  Fact Sheet for Healthcare Providers: SeriousBroker.it  This test is not yet approved or cleared by the [place] FDA and has been authorized for detection and/or diagnosis of SARS-CoV-2 by FDA under an Emergency Use Authorization (EUA). This EUA will remain in effect (meaning this test can be used)  for the duration of the COVID-19 declaration under Section 564(b)(1) of the Act, 21 U.S.C. section 360bbb-3(b)(1), unless the authorization is terminated or revoked.  Performed at [facility], [address], [place], [place] [address]   Urine culture     Status: None   Collection Time: 09/21/20  2:00 AM   Specimen: In/Out Cath Urine  Result Value Ref Range Status   Specimen Description   Final    IN/OUT CATH URINE Performed at [facility], [address] [place], [place] [address]    Special Requests   Final    NONE Performed at [facility], [address], [place], [place] [address]    Culture   Final    NO GROWTH Performed at [facility], [address], [place], [place] [address]    Report Status 09/22/2020 FINAL  Final    RN Pressure Injury Documentation:     Estimated body mass index is 30.27 kg/m as calculated from the following:   Height as of this encounter: 5\' 8"  (1.727 m).   Weight as of this encounter: 90.3 kg.  Malnutrition Type:   Malnutrition Characteristics:   Nutrition Interventions:    Radiology Studies: CT ANGIO HEAD W OR WO CONTRAST  Result Date: 09/23/2020 CLINICAL DATA:  Stroke/TIA, assess intracranial arteries EXAM: CT ANGIOGRAPHY HEAD AND NECK TECHNIQUE: Multidetector CT imaging of the head and neck was performed using the standard protocol during bolus administration of intravenous contrast. Multiplanar CT image reconstructions and MIPs were obtained to evaluate the vascular anatomy. Carotid stenosis measurements (when applicable) are obtained utilizing NASCET criteria, using the distal internal carotid diameter as the denominator. CONTRAST:  20mL OMNIPAQUE IOHEXOL 350 MG/ML SOLN COMPARISON:  09/23/2020 and prior. FINDINGS: CT HEAD FINDINGS Brain: Scattered small acute infarcts involving the right cerebrum, basal ganglia and anterior corpus callosum are better demonstrated on same  day MRI. No mass lesion. No midline shift, ventriculomegaly or extra-axial fluid collection. Mild cerebral atrophy with ex vacuo dilatation and chronic microvascular ischemic changes. Chronic left corona radiata and right basal ganglia lacunar insults. Vascular: No hyperdense vessel or unexpected calcification. Skull: Negative for  fracture or focal lesion. Sinuses/Orbits: Normal orbits. Small left sphenoid sinus mucous retention cyst. No mastoid effusion. Other: None. Review of the MIP images confirms the above findings CTA NECK FINDINGS Aortic arch: Standard branching. Patent great vessel origins. No evidence of dissection. Aortic arch atheromatous disease with plaque ulceration along the inferior transverse segment. Right carotid system: Patent CCA. Bifurcation atheromatous disease with at least 90% proximal ICA narrowing. Left carotid system: Patent. Bifurcation atheromatous disease with less than 50% proximal ICA narrowing. Vertebral arteries: Dominant left vertebral artery.  Patent. Skeleton: No acute finding. Chronic left rib deformity. Multilevel spondylosis. Other neck: No adenopathy.  No soft tissue mass. Upper chest: Upper lung atelectasis. Review of the MIP images confirms the above findings CTA HEAD FINDINGS Anterior circulation: Bilateral carotid siphon atherosclerotic calcifications with mild right supraclinoid ICA narrowing. Patent ACAs and MCAs. Posterior circulation: Mild-to-moderate right V4 segment narrowing. Patent basilar and superior cerebellar arteries. Patent PICA. Patent bilateral PCAs with mild narrowing and irregularity on the left. Venous sinuses: As permitted by contrast timing, patent. Anatomic variants: None. Review of the MIP images confirms the above findings IMPRESSION: Head CT: Multifocal small right cerebrum infarcts are better demonstrated on same day MRI. Chronic right basal ganglia and left corona radiata lacunar insults. CTA neck: At least 90% proximal right ICA narrowing.  Less than 50% proximal left ICA narrowing. CTA head: Mild to moderate right V4 segment narrowing. Mild right supraclinoid ICA and left PCA narrowing. These results will be called to the ordering clinician or representative by the Radiologist Assistant, and communication documented in the PACS or [employer]. Electronically Signed   By: Stana Buntinghikanele  Emekauwa M.D.   On: 09/23/2020 19:55   CT ANGIO NECK W OR WO CONTRAST  Result Date: 09/23/2020 CLINICAL DATA:  Stroke/TIA, assess intracranial arteries EXAM: CT ANGIOGRAPHY HEAD AND NECK TECHNIQUE: Multidetector CT imaging of the head and neck was performed using the standard protocol during bolus administration of intravenous contrast. Multiplanar CT image reconstructions and MIPs were obtained to evaluate the vascular anatomy. Carotid stenosis measurements (when applicable) are obtained utilizing NASCET criteria, using the distal internal carotid diameter as the denominator. CONTRAST:  80mL OMNIPAQUE IOHEXOL 350 MG/ML SOLN COMPARISON:  09/23/2020 and prior. FINDINGS: CT HEAD FINDINGS Brain: Scattered small acute infarcts involving the right cerebrum, basal ganglia and anterior corpus callosum are better demonstrated on same day MRI. No mass lesion. No midline shift, ventriculomegaly or extra-axial fluid collection. Mild cerebral atrophy with ex vacuo dilatation and chronic microvascular ischemic changes. Chronic left corona radiata and right basal ganglia lacunar insults. Vascular: No hyperdense vessel or unexpected calcification. Skull: Negative for fracture or focal lesion. Sinuses/Orbits: Normal orbits. Small left sphenoid sinus mucous retention cyst. No mastoid effusion. Other: None. Review of the MIP images confirms the above findings CTA NECK FINDINGS Aortic arch: Standard branching. Patent great vessel origins. No evidence of dissection. Aortic arch atheromatous disease with plaque ulceration along the inferior transverse segment. Right carotid system:  Patent CCA. Bifurcation atheromatous disease with at least 90% proximal ICA narrowing. Left carotid system: Patent. Bifurcation atheromatous disease with less than 50% proximal ICA narrowing. Vertebral arteries: Dominant left vertebral artery.  Patent. Skeleton: No acute finding. Chronic left rib deformity. Multilevel spondylosis. Other neck: No adenopathy.  No soft tissue mass. Upper chest: Upper lung atelectasis. Review of the MIP images confirms the above findings CTA HEAD FINDINGS Anterior circulation: Bilateral carotid siphon atherosclerotic calcifications with mild right supraclinoid ICA narrowing. Patent ACAs and MCAs. Posterior circulation: Mild-to-moderate right V4  segment narrowing. Patent basilar and superior cerebellar arteries. Patent PICA. Patent bilateral PCAs with mild narrowing and irregularity on the left. Venous sinuses: As permitted by contrast timing, patent. Anatomic variants: None. Review of the MIP images confirms the above findings IMPRESSION: Head CT: Multifocal small right cerebrum infarcts are better demonstrated on same day MRI. Chronic right basal ganglia and left corona radiata lacunar insults. CTA neck: At least 90% proximal right ICA narrowing. Less than 50% proximal left ICA narrowing. CTA head: Mild to moderate right V4 segment narrowing. Mild right supraclinoid ICA and left PCA narrowing. These results will be called to the ordering clinician or representative by the Radiologist Assistant, and communication documented in the PACS or [employer]. Electronically Signed   By: Stana Bunting M.D.   On: 09/23/2020 19:55   MR BRAIN WO CONTRAST  Result Date: 09/23/2020 CLINICAL DATA:  Mental status change, unknown cause EXAM: MRI HEAD WITHOUT CONTRAST TECHNIQUE: Multiplanar, multiecho pulse sequences of the brain and surrounding structures were obtained without intravenous contrast. COMPARISON:  09/21/2020 and prior. FINDINGS: Brain: Scattered small DWI hyperintensities  predominantly involving the right cerebral watershed territories, anterior corpus callosum and right basal ganglia. No intracranial hemorrhage. No midline shift, ventriculomegaly or extra-axial fluid collection. No mass lesion. Mild cerebral atrophy with ex vacuo dilatation. Mild chronic microvascular ischemic changes. Chronic right basal ganglia and left corona radiata lacunar insults. Vascular: Major intracranial flow voids are preserved at the skull base. Skull and upper cervical spine: Normal marrow signal. Sinuses/Orbits: Sequela of bilateral lens replacement. Clear paranasal sinuses and mastoid air cells. Other: None. IMPRESSION: Scattered small subacute infarcts involving the right cerebrum, right basal ganglia and anterior corpus callosum. Consider CTA head and neck. Chronic right basal ganglia and left corona radiata lacunar insults. Mild chronic microvascular ischemic changes. These results were called by telephone at the time of interpretation on 09/23/2020 at 2:50 pm to provider [facility] , who verbally acknowledged these results. Electronically Signed   By: Stana Bunting M.D.   On: 09/23/2020 14:53   DG CHEST PORT 1 VIEW  Result Date: 09/23/2020 CLINICAL DATA:  80 year old male with history of confusion and shortness of breath. EXAM: PORTABLE CHEST 1 VIEW COMPARISON:  Chest x-ray 09/20/2020. FINDINGS: Lung volumes are low. No consolidative airspace disease. No pleural effusions. No pneumothorax. No pulmonary nodule or mass noted. Pulmonary vasculature and the cardiomediastinal silhouette are within normal limits. IMPRESSION: 1. Low lung volumes without radiographic evidence of acute cardiopulmonary disease. Electronically Signed   By: Trudie Reed M.D.   On: 09/23/2020 14:59   Scheduled Meds: . aspirin EC  81 mg Oral Daily  . enoxaparin (LOVENOX) injection  40 mg Subcutaneous Q24H  . folic acid  1 mg Oral Daily  . guaiFENesin  1,200 mg Oral BID  . insulin aspart  0-9 Units  Subcutaneous Q4H  . LORazepam  0-4 mg Intravenous Q12H  . multivitamin with minerals  1 tablet Oral Daily  . pravastatin  40 mg Oral Daily  . thiamine  100 mg Oral Daily   Or  . thiamine  100 mg Intravenous Daily   Continuous Infusions: . azithromycin 500 mg (09/24/20 0622)  . cefTRIAXone (ROCEPHIN)  IV 2 g (09/24/20 0430)    LOS: 3 days   Azucena Fallen, DO [facility] Pager: Secure chat  If 7PM-7AM, please contact night-coverage [email or web address]

## 2020-09-24 NOTE — Plan of Care (Signed)
  Problem: Education: Goal: Knowledge of General Education information will improve Description: Including pain rating scale, medication(s)/side effects and non-pharmacologic comfort measures Outcome: Progressing   Problem: Clinical Measurements: Goal: Ability to maintain clinical measurements within normal limits will improve Outcome: Progressing Goal: Will remain free from infection Outcome: Progressing Goal: Diagnostic test results will improve Outcome: Progressing Goal: Respiratory complications will improve Outcome: Progressing Goal: Cardiovascular complication will be avoided Outcome: Progressing   Problem: Ischemic Stroke/TIA Tissue Perfusion: Goal: Complications of ischemic stroke/TIA will be minimized Outcome: Progressing   

## 2020-09-24 NOTE — Progress Notes (Signed)
Occupational Therapy Treatment Patient Details Name: Vincent Campbell MRN: [id number] DOB: 07/10/1941 Today's Date: 09/24/2020    History of present illness 80 y.o. male presenting with lethargy and AMS. Patient admitted with acute encephalopathy and suspicion for PNA. MRI (+) scattered small subacute infarcts involving the right cerebrum, right basal ganglia and anterior corpus callosum. CT (+) at least 90% proximal right ICA narrowing and less than 50% proximal left ICA narrowing. Further work-up pending. 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   OT comments  OT treatment session with focus on self-care re-education, ADL transfers and patient/family education. Patient is very [facility] making patient/family education a hardship. Wife present at bedside throughout session. Patient continues to require up to Mod A for LB ADLs and Min guard to Min A grossly for ADL transfers and would benefit from continued acute OT services to maximize safety and independence in prep for return home with wife. Education provided on home set-up and acquisition/use of AE including tub bench. Wife expressed verbal understanding. OT will continue to follow acutely. HHOT and 24hr supervision/assist remains appropriate. Patient/family would benefit from hands on education at time of next treatment session.    Follow Up Recommendations  Home health OT;Supervision/Assistance - 24 hour    Equipment Recommendations  Tub/shower bench    Recommendations for Other Services      Precautions / Restrictions Precautions Precautions: Fall Precaution Comments: High fall risk, very HOH (has hearing amplifier) Restrictions Weight Bearing Restrictions: No       Mobility Bed Mobility Overal bed mobility: Needs Assistance Bed Mobility: Supine to Sit     Supine to sit: Min guard     General bed mobility comments: Patient able to  advance BLE toward EOB and elevate trunk to sitting with Min guard +rail.    Transfers Overall transfer level: Needs assistance Equipment used: Rolling walker (2 wheeled) Transfers: Sit to/from Stand Sit to Stand: Min guard         General transfer comment: Min guard for safety wtih use of RW.    Balance     Sitting balance-Leahy Scale: Fair       Standing balance-Leahy Scale: Fair                             ADL either performed or assessed with clinical judgement   ADL Overall ADL's : Needs assistance/impaired                     Lower Body Dressing: Moderate assistance;Set up;Supervision/safety;Cueing for sequencing;Sit to/from stand Lower Body Dressing Details (indicate cue type and reason): Able to adjust bilateral socks seated EOB without external assist and increased time/effort. Leans far back to complete LB dressing tasks.             Functional mobility during ADLs: Minimal assistance;Cueing for safety General ADL Comments: Patient limited by decreased sensation/coordination, decreased frustration tolerance, severe hearing impairments and decreased balance.     Vision       Perception     Praxis      Cognition Arousal/Alertness: Awake/alert Behavior During Therapy: Agitated (Low frustration tolerance) Overall Cognitive Status: Impaired/Different from baseline Area of Impairment: Safety/judgement                         Safety/Judgement: Decreased awareness of safety;Decreased awareness of deficits  General Comments: Patient A&Ox4. Patient with decreased knowledge of deficits and decreased safety awareness requiring frequent cues for safety. Patient very HOH. Has hearing amplifier in R ear.        Exercises     Shoulder Instructions       General Comments Wife present at bedside. Patient/family eduation on safety with household mobility in prep for ADLs and need for supervision assist with mobiliy. Wife  expressed verbal understanding.    Pertinent Vitals/ Pain       Pain Assessment: No/denies pain  Home Living                                          Prior Functioning/Environment              Frequency  Min 2X/week        Progress Toward Goals  OT Goals(current goals can now be found in the care plan section)  Progress towards OT goals: Progressing toward goals  Acute Rehab OT Goals Patient Stated Goal: To retun home with wife. OT Goal Formulation: With family Time For Goal Achievement: 10/04/20 Potential to Achieve Goals: Good ADL Goals Pt Will Perform Grooming: with modified independence Pt Will Perform Lower Body Dressing: with modified independence Pt Will Transfer to Toilet: with modified independence;regular height toilet;ambulating Pt Will Perform Toileting - Clothing Manipulation and hygiene: with modified independence;sit to/from stand  Plan Discharge plan remains appropriate;Frequency remains appropriate    Co-evaluation                 AM-PAC OT "6 Clicks" Daily Activity     Outcome Measure   Help from another person eating meals?: A Little Help from another person taking care of personal grooming?: A Little Help from another person toileting, which includes using toliet, bedpan, or urinal?: A Little Help from another person bathing (including washing, rinsing, drying)?: A Little Help from another person to put on and taking off regular upper body clothing?: A Little Help from another person to put on and taking off regular lower body clothing?: A Lot 6 Click Score: 17    End of Session Equipment Utilized During Treatment: Gait belt;Rolling walker  OT Visit Diagnosis: Unsteadiness on feet (R26.81);History of falling (Z91.81);Other symptoms and signs involving the nervous system (R29.898);Low vision, both eyes (H54.2)   Activity Tolerance Treatment limited secondary to agitation   Patient Left in chair;with chair alarm  set;with family/visitor present   Nurse Communication Mobility status        Time: 3474-2595 OT Time Calculation (min): 23 min  Charges: OT General Charges $OT Visit: 1 Visit OT Treatments $Self Care/Home Management : 8-22 mins $Therapeutic Activity: 8-22 mins  Tru Leopard H. OTR/L Supplemental OT, Department of rehab services [phone]   Guadalupe Kerekes R H. 09/24/2020, 12:39 PM

## 2020-09-24 NOTE — TOC CAGE-AID Note (Signed)
Transition of Care [facility]) - CAGE-AID Screening   Patient Details  Name: Vincent Campbell MRN: [id number] Date of Birth: 1941-02-09  Transition [facility]) CM/SW Contact:    Kermit Balo, RN Phone Number: 09/24/2020, 1:28 PM   Clinical Narrative: Pt states he only has alcoholic beverages a few times a week. He doesn't feel he needs counseling resources at this time.   CAGE-AID Screening:    Have You Ever Felt You Ought to Cut Down on Your Drinking or Drug Use?: No Have People Annoyed You By Critizing Your Drinking Or Drug Use?: No Have You Felt Bad Or Guilty About Your Drinking Or Drug Use?: No Have You Ever Had a Drink or Used Drugs First Thing In The Morning to Steady Your Nerves or to Get Rid of a Hangover?: No CAGE-AID Score: 0  Substance Abuse Education Offered: Yes (pt refused)

## 2020-09-24 NOTE — Consult Note (Addendum)
Hospital Consult    Reason for Consult:  Right carotid artery stenosis Requesting Physician:  Curly Shores MRN #:  [id number]  History of Present Illness: This is a 80 y.o. male who is admitted with confusion and speech difficulties.  He is here in his room with his wife of 52 years.   Pt is very HOH and hears better in the right ear.   He states that he is left handed.  He states that he came to the hospital after his wife found him in the floor after he fell.  It is difficult to determine if he had any weakness as the cause.  Upon speaking with him further, he has had some left hand/arm weakness for about 4-5 years compared to the right.  He states he has had a little left leg weakness as well.  His wife states that it was difficult to understand his speech the night he was brought to the hospital.  He denies any temporary blindness.  He states he has not smoked for 181 days.  He denies any pain in his legs with walking.   He does have chronic elevated white blood count.   He states that he has seen every president in person back to [employer] (who he met) with the exception of [employer].    He was in the [employer].  Pt has BUN/creatinine of 11/0.84  The pt is on a statin for cholesterol management.  The pt is on a daily aspirin.   Other AC:  Lovenox for DVT prophlylaxis The pt is on ARB, BB for hypertension.   The pt is diabetic.   Tobacco hx:  Former-quit 181 days ago  Past Medical History:  Diagnosis Date  . BACK PAIN 01/12/2008  . COLONIC POLYPS, HX OF 01/12/2008  . Cough 05/28/2010  . Encounter for well adult exam with abnormal findings 12/29/2010  . HYPERLIPIDEMIA 01/12/2008  . HYPERTENSION 01/12/2008  . Hypothyroidism 09/15/2018  . LEUKOCYTOSIS 03/02/2008  . PERIPHERAL EDEMA 06/28/2010  . SWELLING MASS OR LUMP IN HEAD AND NECK 05/28/2010  . Tremor 08/02/2012    Past Surgical History:  Procedure Laterality Date  . APPENDECTOMY    . CATARACT EXTRACTION    . CHOLECYSTECTOMY       No Known Allergies  Prior to Admission medications   Medication Sig Start Date End Date Taking? Authorizing Provider  aspirin 81 MG tablet Take 81 mg by mouth daily.   Yes [provider]  Glucosamine 500 MG CAPS Take 500 mg by mouth daily.   Yes [provider]  hydrochlorothiazide (MICROZIDE) 12.5 MG capsule Take 1 capsule (12.5 mg total) by mouth daily. 04/12/20  Yes Biagio Borg, MD  losartan (COZAAR) 25 MG tablet Take 4 tablets (100 mg total) by mouth daily. 07/30/20  Yes Biagio Borg, MD  metFORMIN (GLUCOPHAGE-XR) 500 MG 24 hr tablet Take 3 tablets (1,500 mg total) by mouth daily with breakfast. 09/24/19  Yes Biagio Borg, MD  pravastatin (PRAVACHOL) 40 MG tablet Take 1 tablet (40 mg total) by mouth daily. 04/12/20  Yes Biagio Borg, MD  VITAMIN D PO Take 2 capsules by mouth daily.   Yes [provider]  ZINC CITRATE PO Take 1 tablet by mouth daily at 6 (six) AM.   Yes [provider]  EUTHYROX 25 MCG tablet TAKE 1 TABLET BY MOUTH ONCE DAILY BEFORE BREAKFAST 09/24/20   Biagio Borg, MD  losartan (COZAAR) 100 MG tablet Take 1 tablet by  mouth once daily Patient not taking: Reported on 09/21/2020 07/23/20   Biagio Borg, MD  propranolol (INDERAL) 10 MG tablet Take 1 tablet (10 mg total) by mouth 2 (two) times daily. Patient not taking: Reported on 09/21/2020 04/12/20   Biagio Borg, MD    Social History   Socioeconomic History  . Marital status: Married    Spouse name: Not on file  . Number of children: Not on file  . Years of education: Not on file  . Highest education level: Not on file  Occupational History  . Occupation: OWNER    Employer: [employer]  . Occupation: former Personnel officer  . Occupation: former  Location manager at [employer]  . Smoking status: Current Every Day Smoker  . Smokeless tobacco: Never Used  Substance and Sexual Activity  . Alcohol use: Yes  . Drug use: No  . Sexual  activity: Not on file  Other Topics Concern  . Not on file  Social History Narrative  . Not on file   Social Determinants of Health   Financial Resource Strain: Not on file  Food Insecurity: Not on file  Transportation Needs: Not on file  Physical Activity: Not on file  Stress: Not on file  Social Connections: Not on file  Intimate Partner Violence: Not on file    Family History  Problem Relation Age of Onset  . Cancer Other        esophageal cancer  . Heart disease Other   . Hypertension Other     ROS: [x]  Positive   [ ]  Negative   [ ]  All sytems reviewed and are negative  Cardiac: []  chest pain/pressure [x]  high blood pressure  Vascular: []  pain in legs while walking [x]  swelling in legs  Pulmonary: [x]  asthma/wheezing as a kid  Neurologic: [x]  hx of CVA []  mini stroke [x]  left sided weakness [x]  speech problems per wife   Hematologic: []  hx of cancer  Endocrine:   [x]  diabetes [x]  thyroid disease  GI []  GERD  GU: []  CKD/renal failure []  HD--[]  M/W/F or []  T/T/S  Psychiatric: []  anxiety []  depression  Musculoskeletal: []  arthritis []  joint pain  Integumentary: []  rashes []  ulcers  Constitutional: []  fever  []  chills  Physical Examination  Vitals:   09/24/20 0933 09/24/20 1136  BP: (!) 153/82 114/75  Pulse: 86 86  Resp: 18 18  Temp: 98.1 F (36.7 C) 97.7 F (36.5 C)  SpO2: 98% 95%   Body mass index is 30.27 kg/m.  General:  WDWN in NAD Gait: Not observed HENT: WNL, normocephalic; hard of hearing; hearing aid right hear Pulmonary: normal non-labored breathing Cardiac: regular, without Murmur; without carotid bruits Abdomen:  soft, NT/ND; aortic pulse is not palpable Skin: without rashes Vascular Exam/Pulses:  Right Left  Radial 2+ (normal) 2+ (normal)  Popliteal Unable to palpate Unable to palpate  DP Unable to palpate Unable to palpate  PT Unable to palpate Unable to palpate   Extremities: without ischemic changes,  without Gangrene , without cellulitis; without open wounds; left arm 4/5 strength right arm 5/5 strength; left leg 5/5 strength right leg 5/5 strength; pitting ankle edema BLE Musculoskeletal: no muscle wasting or atrophy  Neurologic: A&O X 3; speech is fluent/normal Psychiatric:  The pt has Normal affect.   CBC    Component Value Date/Time   WBC 13.1 (H) 09/23/2020 1654   RBC 4.09 (L) 09/23/2020 1654   HGB 13.7 09/23/2020  1654   HCT 39.1 09/23/2020 1654   PLT 242 09/23/2020 1654   MCV 95.6 09/23/2020 1654   MCH 33.5 09/23/2020 1654   MCHC 35.0 09/23/2020 1654   RDW 13.9 09/23/2020 1654   LYMPHSABS 1.9 09/23/2020 0528   MONOABS 2.1 (H) 09/23/2020 0528   EOSABS 0.3 09/23/2020 0528   BASOSABS 0.1 09/23/2020 0528    BMET    Component Value Date/Time   NA 136 09/23/2020 0528   K 3.9 09/23/2020 0528   CL 103 09/23/2020 0528   CO2 23 09/23/2020 0528   GLUCOSE 122 (H) 09/23/2020 0528   BUN 11 09/23/2020 0528   CREATININE 0.84 09/23/2020 0528   CALCIUM 8.2 (L) 09/23/2020 0528   GFRNONAA >60 09/23/2020 0528   GFRAA 66 (L) 11/28/2013 1238    COAGS: Lab Results  Component Value Date   INR 1.3 (H) 09/20/2020   INR 0.93 11/28/2013     Non-Invasive Vascular Imaging:   CTA head/neck 09/23/2020: IMPRESSION: Head CT:  Multifocal small right cerebrum infarcts are better demonstrated on same day MRI.  Chronic right basal ganglia and left corona radiata lacunar insults.  CTA neck:  At least 90% proximal right ICA narrowing. Less than 50% proximal left ICA narrowing.  CTA head:  Mild to moderate right V4 segment narrowing.  Mild right supraclinoid ICA and left PCA narrowing.   Carotid duplex 09/24/2020: Summary:  Right Carotid: Velocities in the right ICA are consistent with a 80-99%         stenosis.  Left Carotid: Velocities in the left ICA are consistent with a 1-39%  stenosis.    ASSESSMENT/PLAN: This is a 80 y.o. male with symptomatic right  carotid artery stenosis > 80%  -pt has had left arm weakness for about 4-5 years.  He did have some speech difficulties in the ER the night of admission per his wife.   -pt will need carotid intervention - either right CEA or right TCAR.  Dr. Donzetta Matters will evaluate pt later today and make determination. -discussed risk of stroke, bleeding and death with surgery.   -continue asa/statin -encouraged pt to continue to not smoke.    Leontine Locket, PA-C [facility] [phone]  I have independently interviewed and examined patient and agree with PA assessment and plan above.  Plan will be for right sided transcarotid artery stent on Wednesday.  He is on aspirin and a statin and I will initiate Plavix therapy as well for at least 3 months.  I discussed the risk and benefits as well as the alternative of medical therapy or carotid endarterectomy and they demonstrate good understanding.  Patient will need to be n.p.o. Tuesday past midnight  Bryna Razavi C. Donzetta Matters, MD [facility] [place] Office: [phone] Pager: [phone]

## 2020-09-24 NOTE — TOC Initial Note (Signed)
Transition of Care [facility]) - Initial/Assessment Note    Patient Details  Name: Vincent Campbell MRN: [id number] Date of Birth: [birth year]  Transition of Care [facility]) CM/SW Contact:    Pollie Friar, RN Phone Number: 09/24/2020, 1:53 PM  Clinical Narrative:                 Recommendations are for [facility] services. CM met with the patient and his spouse. They have no preference for [facility] agency. HH arranged through [facility].  Pt denies issues with home medications and wife states she can provide needed supervision and transportation. TOC following.  Expected Discharge Plan: [place] Barriers to Discharge: Continued Medical Work up   Patient Goals and CMS Choice   CMS Medicare.gov Compare Post Acute Care list provided to:: Patient Choice offered to / list presented to : [facility]  Expected Discharge Plan and Services Expected Discharge Plan: [place]   Discharge Planning Services: CM Consult Post Acute Care Choice: [place] arrangements for the past 2 months: [place]: PT,OT [place] Agency: [place] Date [place]: 09/24/20   Representative spoke with at [place]: Hoyle Sauer  Prior Living Arrangements/Services Living arrangements for the past 2 months: [place] Lives with:: Spouse Patient language and need for interpreter reviewed:: Yes Do you feel safe going back to the place where you live?: Yes      Need for Family Participation in Patient Care: Yes (Comment) Care giver support system in place?: Yes (comment) Current home services: DME (cane/ 3 in 1) Criminal Activity/Legal Involvement Pertinent to Current Situation/Hospitalization: No - Comment as needed  Activities of Daily Living Home Assistive Devices/Equipment: Walker (specify type) ADL Screening (condition at time of admission) Patient's cognitive ability adequate to safely complete  daily activities?: No Is the patient deaf or have difficulty hearing?: Yes Does the patient have difficulty seeing, even when wearing glasses/contacts?: No Does the patient have difficulty concentrating, remembering, or making decisions?: Yes Patient able to express need for assistance with ADLs?: Yes Does the patient have difficulty dressing or bathing?: Yes Independently performs ADLs?: No Communication: Independent Dressing (OT): Needs assistance Grooming: Needs assistance Feeding: Independent Bathing: Needs assistance Toileting: Needs assistance In/Out Bed: Needs assistance Is this a change from baseline?: Change from baseline, expected to last <3 days Walks in Home: Independent with device (comment) Does the patient have difficulty walking or climbing stairs?: Yes Weakness of Legs: Both Weakness of Arms/Hands: None  Permission Sought/Granted                  Emotional Assessment Appearance:: Appears stated age Attitude/Demeanor/Rapport: Engaged Affect (typically observed): Accepting Orientation: : Oriented to Self,Oriented to Place,Oriented to  Time,Oriented to Situation   Psych Involvement: No (comment)  Admission diagnosis:  Encephalopathy acute [G93.40] Acute encephalopathy [G93.40] Community acquired pneumonia, unspecified laterality [J18.9] Patient Active Problem List   Diagnosis Date Noted  . Encephalopathy acute 09/21/2020  . CKD (chronic kidney disease), stage II 09/21/2020  . Alcohol use 09/21/2020  . Community acquired pneumonia   . Cough 02/08/2019  . Hypothyroidism 09/15/2018  . Wellness examination 07/13/2017  . Bilateral hearing loss 06/28/2016  . Bilateral knee pain 06/28/2016  . Loss of weight 02/24/2014  . Increased prostate specific antigen (PSA) velocity 02/22/2014  .  Allergic rhinitis, cause unspecified 02/22/2014  . Smoker 08/11/2013  . Dysphoric mood 08/11/2013  . Tremor 08/02/2012  . Diabetes ([place]) 01/03/2011  . PERIPHERAL EDEMA  06/28/2010  . LEUKOCYTOSIS 03/02/2008  . Hyperlipidemia 01/12/2008  . Essential hypertension 01/12/2008  . BACK PAIN 01/12/2008  . COLONIC POLYPS, HX OF 01/12/2008   PCP:  Biagio Borg, MD Pharmacy:   [place], [address] [place] [address] Phone: [phone] Fax: [phone]  [id number] - [place], [place] - [address] [place] [address] Phone: [phone] Fax: [phone]     Social Determinants of Health (SDOH) Interventions    Readmission Risk Interventions No flowsheet data found.

## 2020-09-24 NOTE — Progress Notes (Signed)
Physical Therapy Treatment Patient Details Name: Vincent Campbell MRN: [id number] DOB: 1940-09-03 Today's Date: 09/24/2020    History of Present Illness 80 y.o. male presenting with lethargy and AMS. Patient admitted with acute encephalopathy and suspicion for PNA. MRI (+) scattered small subacute infarcts involving the right cerebrum, right basal ganglia and anterior corpus callosum. CT (+) at least 90% proximal right ICA narrowing and less than 50% proximal left ICA narrowing. Further work-up pending. 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    PT Comments    Patient progressing well towards PT goals. Continues to be easily frustrated by all the lines/IV pole/cords. Requires Min guard assist for transfers and gait training with use of RW for support. Veers left during walking and needs manual cues to place left hand on RW handle. Pt with left inattention and poor awareness of safety/deficits. Very HOH so needs repetition at times to follow commands/answer questions. "I am just doing whatever you tell me to do." Wife present and supportive; made aware of deficits. Will follow and plan for stair training next session.    Follow Up Recommendations  Home health PT;Supervision for mobility/OOB     Equipment Recommendations  None recommended by PT    Recommendations for Other Services       Precautions / Restrictions Precautions Precautions: Fall Precaution Comments: High fall risk, very HOH (has hearing amplifier) Restrictions Weight Bearing Restrictions: No    Mobility  Bed Mobility Overal bed mobility: Needs Assistance Bed Mobility: Supine to Sit     Supine to sit: Min guard     General bed mobility comments: Up in chair upon PT arrival.    Transfers Overall transfer level: Needs assistance Equipment used: Rolling walker (2 wheeled) Transfers: Sit to/from Stand Sit to Stand:  Min guard         General transfer comment: Min guard for safety with use of RW. Impulsively stood from chair before getting all lines ready.  Ambulation/Gait Ambulation/Gait assistance: Min guard Gait Distance (Feet): 150 Feet Assistive device: Rolling walker (2 wheeled) Gait Pattern/deviations: Drifts right/left;Decreased step length - right;Decreased step length - left;Trunk flexed;Step-through pattern Gait velocity: decreased   General Gait Details: Slow, mildly unsteady gait with poor awareness of left side with left hand needing to be manually placed on RW handle, veers left and walks on left side within RW. Cues for RW management.   Stairs             Wheelchair Mobility    Modified Rankin (Stroke Patients Only) Modified Rankin (Stroke Patients Only) Pre-Morbid Rankin Score: Slight disability Modified Rankin: Moderately severe disability     Balance Overall balance assessment: Needs assistance Sitting-balance support: Feet supported;No upper extremity supported Sitting balance-Leahy Scale: Good Sitting balance - Comments: supervision for safety.   Standing balance support: During functional activity Standing balance-Leahy Scale: Fair Standing balance comment: Able to stand statically without UE support; does better with UE support for walking.                            Cognition Arousal/Alertness: Awake/alert Behavior During Therapy:  (easily frustrated esp with lines) Overall Cognitive Status: Impaired/Different from baseline Area of Impairment: Safety/judgement                         Safety/Judgement: Decreased awareness of safety;Decreased awareness of deficits  General Comments: Patient A&Ox4. Patient with decreased knowledge of deficits and decreased safety awareness requiring frequent cues for safety. Patient very HOH needing repetition of cues. Has hearing amplifier in Rt ear. Left inattention.      Exercises       General Comments General comments (skin integrity, edema, etc.): Wife present. Discussed pt's decreased awareness of left side during session.      Pertinent Vitals/Pain Pain Assessment: No/denies pain    Home Living                      Prior Function            PT Goals (current goals can now be found in the care plan section) Acute Rehab PT Goals Patient Stated Goal: To retun home with wife. Progress towards PT goals: Progressing toward goals    Frequency    Min 3X/week      PT Plan Current plan remains appropriate    Co-evaluation              AM-PAC PT "6 Clicks" Mobility   Outcome Measure  Help needed turning from your back to your side while in a flat bed without using bedrails?: None Help needed moving from lying on your back to sitting on the side of a flat bed without using bedrails?: A Little Help needed moving to and from a bed to a chair (including a wheelchair)?: A Little Help needed standing up from a chair using your arms (e.g., wheelchair or bedside chair)?: A Little Help needed to walk in hospital room?: A Little Help needed climbing 3-5 steps with a railing? : A Lot 6 Click Score: 18    End of Session Equipment Utilized During Treatment: Gait belt Activity Tolerance: Patient tolerated treatment well Patient left: in chair;with call bell/phone within reach;with chair alarm set;with family/visitor present Nurse Communication: Mobility status PT Visit Diagnosis: Other abnormalities of gait and mobility (R26.89);Unsteadiness on feet (R26.81)     Time: 1205-1220 PT Time Calculation (min) (ACUTE ONLY): 15 min  Charges:  $Gait Training: 8-22 mins                     Vale Haven, PT, DPT Acute Rehabilitation Services Pager [phone] Office [phone]       Blake Divine A Lanier Ensign 09/24/2020, 1:17 PM

## 2020-09-24 NOTE — Progress Notes (Signed)
STROKE TEAM PROGRESS NOTE   INTERVAL HISTORY Patient is sitting up in bedside chair.  States is doing well.  He has no residual deficits.  CT angiogram and carotid ultrasound both show significant 80 to 90% right proximal ICA stenosis and MRI scan shows small watershed infarcts in the right ACA/MCA border zone territory.  Vascular surgery consult is pending.  Vitals:   09/23/20 2019 09/23/20 2354 09/24/20 0000 09/24/20 0350  BP: (!) 141/86 (!) 151/88  (!) 154/87  Pulse: 81 84 84 94  Resp: 18 18    Temp: 97.7 F (36.5 C) 98 F (36.7 C)  97.9 F (36.6 C)  TempSrc:      SpO2: 95% 95%  94%  Weight:      Height:       CBC:  Recent Labs  Lab 09/22/20 0459 09/23/20 0528 09/23/20 1654  WBC 12.8* 13.4* 13.1*  NEUTROABS 8.2* 8.9*  --   HGB 13.1 13.7 13.7  HCT 39.4 40.6 39.1  MCV 99.7 98.1 95.6  PLT 207 231 242   Basic Metabolic Panel:  Recent Labs  Lab 09/22/20 0459 09/23/20 0528  NA 138 136  K 3.4* 3.9  CL 104 103  CO2 23 23  GLUCOSE 107* 122*  BUN 12 11  CREATININE 0.90 0.84  CALCIUM 8.3* 8.2*  MG 2.1 2.0  PHOS 2.7 2.4*   Lipid Panel:  Recent Labs  Lab 09/23/20 1654  CHOL 134  TRIG 71  HDL 56  CHOLHDL 2.4  VLDL 14  LDLCALC 64   HgbA1c:  Recent Labs  Lab 09/23/20 1654  HGBA1C 7.0*   Urine Drug Screen: No results for input(s): LABOPIA, COCAINSCRNUR, LABBENZ, AMPHETMU, THCU, LABBARB in the last 168 hours.  Alcohol Level  Recent Labs  Lab 09/21/20 0440  ETH <10    IMAGING past 24 hours CT ANGIO HEAD W OR WO CONTRAST  Result Date: 09/23/2020  IMPRESSION: Head CT: Multifocal small right cerebrum infarcts are better demonstrated on same day MRI. Chronic right basal ganglia and left corona radiata lacunar insults. CTA neck: At least 90% proximal right ICA narrowing. Less than 50% proximal left ICA narrowing. CTA head: Mild to moderate right V4 segment narrowing. Mild right supraclinoid ICA and left PCA narrowing. These results will be called to the ordering  clinician or representative by the Radiologist Assistant, and communication documented in the PACS or [employer]. Electronically Signed   By: Stana Bunting M.D.   On: 09/23/2020 19:55   CT ANGIO NECK W OR WO CONTRAST  Result Date: 09/23/2020  IMPRESSION: Head CT: Multifocal small right cerebrum infarcts are better demonstrated on same day MRI. Chronic right basal ganglia and left corona radiata lacunar insults. CTA neck: At least 90% proximal right ICA narrowing. Less than 50% proximal left ICA narrowing. CTA head: Mild to moderate right V4 segment narrowing. Mild right supraclinoid ICA and left PCA narrowing.  MR BRAIN WO CONTRAST  Result Date: 09/23/2020  IMPRESSION: Scattered small subacute infarcts involving the right cerebrum, right basal ganglia and anterior corpus callosum. Consider CTA head and neck. Chronic right basal ganglia and left corona radiata lacunar insults. Mild chronic microvascular ischemic changes.  DG CHEST PORT 1 VIEW  Result Date: 09/23/2020 IMPRESSION: 1. Low lung volumes without radiographic evidence of acute cardiopulmonary disease.   VAS US CAROTID  Result Date: 09/24/2020 Summary: Right Carotid: Velocities in the right ICA are consistent with a 80-99%  stenosis. Left Carotid: Velocities in the left ICA are consistent with a 1-39% stenosis. Vertebrals:  Bilateral vertebral arteries demonstrate antegrade flow. Subclavians: Normal flow hemodynamics were seen in bilateral subclavian              arteries. *See table(s) above for measurements and observations.     Preliminary    Result Date 09/21/2020 CT Cspine 1. Moderate to marked severity degenerative changes at the levels of C5-C6 and C6-C7. 2. No evidence of an acute fracture or subluxation.   PHYSICAL EXAM Physical Exam  Constitutional: Appears well-developed and well-nourished.  Psych: Affect appropriate to situation. Cooperative with examination.  Eyes: Normal conjunctiva HENT:  No OP obstruction, hearing aid in place right ear, hard of hearing at baseline Head: Normocephalic and atraumatic without obvious deformity Cardiovascular: Extremities warm, well perfused. 2+ edema in bilateral lower extremities.  Respiratory: Effort normal, non-labored breathing.  GI: Soft.  Rounded. There is no tenderness.  Skin: WDI  Neuro: Mental Status: Patient is awake, alert, oriented to person, and situation. He is able to state name correctly. Patient is able to provide some details of current history with some confusion noted. He stated that he has had problems with function in his left hand, arm, and leg. Follows commands with some mimic of examiner due to patient hard of hearing.  No signs of aphasia or neglect noted. Cranial Nerves: II: Visual Fields are full. Pupils are equal, round, and reactive to light 2 mm/brisk III,IV, VI: EOMI without ptosis. V: Facial sensation is symmetric to light touch.  VII: Face is symmetric resting and smiling. Subtle palmomental reflex noted.  VIII: patient is hard of hearing no hearing aid in place in the right ear X: Phonation normal.  XI: Shoulder shrug is symmetric. XII: Tongue protrudes midline. Motor: Tone is normal. Bulk is normal. 5/5 strength in right upper and lower extremities and left lower extremity. Left upper extremity with 4+/5 strength. No pronator drift noted.  Sensory: Sensation is symmetric to light touch in the arms and legs. Cerebellar: FNF diminished  ASSESSMENT/PLAN Vincent Campbell is a 80 y.o. male with history of hypertension, hyperlipidemia, hypothyroidism, mild renal insufficiency, and type 2 diabetes who presented to [facility] on 2/25 for evaluation of lethargy and confusion.   At home he was noted to be more confused and forgetful.  He was taken to the ED.  ED work-up reveled oxygen saturation in the low 90s on room air, chest x-ray with bibasilar opacities, a creatinine of 1.23 but with a leukocytosis with WBC of  15,900. He was initiated on antibiotics and admitted for further work-up.  An MRI was obtained 2/27 for further evaluation of confusion with evidence of scattered small subacute infarcts involving the right cerebrum, right basal ganglia, and anterior corpus callosum with chronic right basal ganglia and left corona radiata lacunar infarcts.  Stroke: Scattered small subacute infarcts involving the right cerebrum, right basal ganglia and anterior corpus callosum likely due to right carotid stenosis.  CT head: Generalized cerebral atrophy.  No acute intracranial abnormality.    CTA head Mild to moderate right V4 segment narrowing.  Mild right supraclinoid ICA and left PCA narrowing  CTA neck At least 90% proximal right ICA narrowing. Less than 50% proximal  left ICA narrowing.  CT perfusion not done   MRI Scattered small subacute infarcts involving the right cerebrum, right basal ganglia and anterior corpus callosum. Chronic right basal ganglia and left corona radiata lacunar insults.  Mild chronic microvascular ischemic  changes  Carotid Doppler right ICA are consistent with a 80-99% stenosis. left ICA 1-39% stenosis  2D Echo EF 60-65%, Grade 1 diastolic dysfunction  LDL 64  SAYT0Z 7.0  VTE prophylaxis - Lovenox 40mg  daily  aspirin 81 mg daily prior to admission, now on aspirin 81 mg daily and clopidogrel 75 mg daily for at least 3 months per vascular surgery  Therapy recommendations:  Home health PT, home health OT Supervision/Assistance - 24 hour   Disposition:  pending  Right Carotid artery stenosis  >80%  vasculary surgery consulted, appreciate recs  Aspirin and plavix for at least 3 months  Planning for right sided Transcarotid artery stent on Wednesday  Hypertension  Home meds: HCTZ 12.5 daily, losartan 100mg  daily, propranolol 10mg  bid  Stable . Long-term BP goal normotensive  Hyperlipidemia  Home meds:  Pravastatin 40mg  daily, resumed in hospital  LDL 64,  goal < 70  Continue statin at discharge  Diabetes type II Controlled  Home meds:  Metformin 1500mg  daily  HgbA1c 7.0, goal < 7.0  CBGs Recent Labs    09/23/20 2020 09/23/20 2354 09/24/20 0400  GLUCAP 162* 111* 128*      SSI  Other Stroke Risk Factors  Advanced Age >/= 64   Cigarette smoker, former, quit 181 days ago  ETOH use, alcohol level <10, drinks liquor every day, unclear amount; concerned that he may be hiding how much he truly drinks from her per previous discussion  advised to drink no more than 1-2 drink(s) a day  Obesity, Body mass index is 30.27 kg/m., BMI >/= 30 associated with increased stroke risk, recommend weight loss, diet and exercise as appropriate   Hx stroke: MRI finding: chronic right basal ganglia and left corona radiata lacunar insults.  Other Active Problems  Hypothyroidism  CKD stage II  Hospital day # 3 I have personally obtained history,examined this patient, reviewed notes, independently viewed imaging studies, participated in medical decision making and plan of care.ROS completed by me personally and pertinent positives fully documented  I have made any additions or clarifications directly to the above note. Agree with note above he presented with symptomatic right carotid stenosis with watershed small infarct involving posterior.  Intervals are equally minor and appear to be improving.  He is at significant risk for recurrent strokes and will benefit with early right carotid revascularization.  Agree with vascular surgery consult and consideration for right carotid TCAR procedure versus endarterectomy.  Discussed with patient, his wife, vascular surgery practitioner and Dr. .  Greater than 50% time during the 35-minute visit was spent on counseling and coordination of care about symptomatic carotid stenosis and discussion about revascularization options stroke prevention and answering questions.  09/25/20, MD Medical  Director [facility] Pager: [phone] 09/25/2020 1:24 PM   To contact Stroke Continuity provider, please refer to Natale Milch. After hours, contact General Neurology

## 2020-09-24 NOTE — Progress Notes (Signed)
VASCULAR LAB    Carotid duplex has been performed.  See CV proc for preliminary results.   KANADY, CANDACE, RVT 09/24/2020, 9:01 AM

## 2020-09-24 NOTE — Progress Notes (Signed)
  Echocardiogram 2D Echocardiogram has been performed.  Burnard Hawthorne 09/24/2020, 8:38 AM

## 2020-09-25 DIAGNOSIS — G9340 Encephalopathy, unspecified: Secondary | ICD-10-CM | POA: Diagnosis not present

## 2020-09-25 DIAGNOSIS — I10 Essential (primary) hypertension: Secondary | ICD-10-CM | POA: Diagnosis not present

## 2020-09-25 LAB — GLUCOSE, CAPILLARY
Glucose-Capillary: 109 mg/dL — ABNORMAL HIGH (ref 70–99)
Glucose-Capillary: 109 mg/dL — ABNORMAL HIGH (ref 70–99)
Glucose-Capillary: 115 mg/dL — ABNORMAL HIGH (ref 70–99)
Glucose-Capillary: 120 mg/dL — ABNORMAL HIGH (ref 70–99)
Glucose-Capillary: 131 mg/dL — ABNORMAL HIGH (ref 70–99)
Glucose-Capillary: 162 mg/dL — ABNORMAL HIGH (ref 70–99)
Glucose-Capillary: 94 mg/dL (ref 70–99)

## 2020-09-25 LAB — CBC
HCT: 39.9 % (ref 39.0–52.0)
Hemoglobin: 13.3 g/dL (ref 13.0–17.0)
MCH: 32.4 pg (ref 26.0–34.0)
MCHC: 33.3 g/dL (ref 30.0–36.0)
MCV: 97.3 fL (ref 80.0–100.0)
Platelets: 263 10*3/uL (ref 150–400)
RBC: 4.1 MIL/uL — ABNORMAL LOW (ref 4.22–5.81)
RDW: 13.7 % (ref 11.5–15.5)
WBC: 11.7 10*3/uL — ABNORMAL HIGH (ref 4.0–10.5)
nRBC: 0 % (ref 0.0–0.2)

## 2020-09-25 LAB — COMPREHENSIVE METABOLIC PANEL
ALT: 18 U/L (ref 0–44)
AST: 18 U/L (ref 15–41)
Albumin: 3.1 g/dL — ABNORMAL LOW (ref 3.5–5.0)
Alkaline Phosphatase: 54 U/L (ref 38–126)
Anion gap: 9 (ref 5–15)
BUN: 11 mg/dL (ref 8–23)
CO2: 26 mmol/L (ref 22–32)
Calcium: 8.5 mg/dL — ABNORMAL LOW (ref 8.9–10.3)
Chloride: 104 mmol/L (ref 98–111)
Creatinine, Ser: 0.93 mg/dL (ref 0.61–1.24)
GFR, Estimated: >60 mL/min (ref >60)
Glucose, Bld: 111 mg/dL — ABNORMAL HIGH (ref 70–99)
Potassium: 3.6 mmol/L (ref 3.5–5.1)
Sodium: 139 mmol/L (ref 135–145)
Total Bilirubin: 1 mg/dL (ref 0.3–1.2)
Total Protein: 6.2 g/dL — ABNORMAL LOW (ref 6.5–8.1)

## 2020-09-25 MED ORDER — SODIUM CHLORIDE 0.9 % IV SOLN
1.5000 g | INTRAVENOUS | Status: AC
  Administered 2020-09-26: 1.5 g via INTRAVENOUS
  Filled 2020-09-25: qty 1.5

## 2020-09-25 NOTE — Care Management Important Message (Signed)
Important Message  Patient Details  Name: Vincent Campbell MRN: [id number] Date of Birth: 08/01/1940   Medicare Important Message Given:  Yes     Dorena Bodo 09/25/2020, 3:25 PM

## 2020-09-25 NOTE — Progress Notes (Signed)
Occupational Therapy Treatment Patient Details Name: Vincent Campbell MRN: [id number] DOB: 1941-02-02 Today's Date: 09/25/2020    History of present illness 80 y.o. male presenting with lethargy and AMS. Patient admitted with acute encephalopathy and suspicion for PNA. MRI (+) scattered small subacute infarcts involving the right cerebrum, right basal ganglia and anterior corpus callosum. CT (+) at least 90% proximal right ICA narrowing and less than 50% proximal left ICA narrowing. Further work-up pending. 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   OT comments  Patient met lying supine in bed. Wife present at bedside for patient/family education session with focus on safe completion of ADLs upon return home. Patient completes UB/LB bathing/dressing with Min guard to Min A overall with wife assisting. OT educated wife on allowing patient to complete as much of the task as he can before offering assistance. Patient frequently becomes frustrated by his wife's attempts to assist him. Patient also greatly limited by profound hearing impairments, decreased balance, decreased attention to L, and decreased safety awareness/awareness of deficits. Patient/family would benefit from continued acute OT services to maximize safety/independence with self-care tasks and to decrease caregiver burden.    Follow Up Recommendations  Home health OT;Supervision/Assistance - 24 hour    Equipment Recommendations  Tub/shower bench    Recommendations for Other Services      Precautions / Restrictions Precautions Precautions: Fall Precaution Comments: High fall risk, very HOH (has hearing amplifier) Restrictions Weight Bearing Restrictions: No       Mobility Bed Mobility Overal bed mobility: Needs Assistance Bed Mobility: Supine to Sit     Supine to sit: Min guard     General bed mobility comments: Patient  able to advance BLE toward EOB and elevate trunk to sitting with Min guard +rail.    Transfers Overall transfer level: Needs assistance Equipment used: Rolling walker (2 wheeled) Transfers: Sit to/from Stand Sit to Stand: Min guard         General transfer comment: Min guard for safety wtih use of RW. Cues for hand placement as patient attempts to stand with both hands on RW instead of pushing from bed surface.    Balance Overall balance assessment: Needs assistance Sitting-balance support: Feet supported;No upper extremity supported Sitting balance-Leahy Scale: Good     Standing balance support: During functional activity Standing balance-Leahy Scale: Fair Standing balance comment: Min guard for steadying in standing during LB dressing tasks.                           ADL either performed or assessed with clinical judgement   ADL Overall ADL's : Needs assistance/impaired         Upper Body Bathing: Set up;Cueing for sequencing;Sitting;Supervision/ safety Upper Body Bathing Details (indicate cue type and reason): Patient able to wash BUE, chest and abdomen with set-up assist and cues for sequencing. Lower Body Bathing: Min guard;Sit to/from stand Lower Body Bathing Details (indicate cue type and reason): Min guard in sitting/standing with cues for safety/sequencing and for taking rest breaks as needed. Patient with poor activity pacing. Upper Body Dressing : Supervision/safety;Sitting Upper Body Dressing Details (indicate cue type and reason): Donned anterior gown seated EOB. Lower Body Dressing: Cueing for sequencing;Sit to/from stand;Minimal assistance;Cueing for safety Lower Body Dressing Details (indicate cue type and reason): Patient requires assist to thread BLE seated EOB and steadying assist while hiking underwear over hips.  Patient easily frustrated when he cannot complete tasks quickly.             Functional mobility during ADLs: Cueing for  safety;Min guard;Minimal assistance General ADL Comments: Patient limited by decreased sensation/coordination, decreased frustration tolerance, decreased safety awareness, profound hearing impairments and decreased balance.     Vision       Perception     Praxis      Cognition Arousal/Alertness: Awake/alert Behavior During Therapy: Agitated (Low frustration tolerance) Overall Cognitive Status: Impaired/Different from baseline Area of Impairment: Safety/judgement                         Safety/Judgement: Decreased awareness of safety;Decreased awareness of deficits     General Comments: Patient with continued low frustration tolerance stating "I just can't hear anything. Imagine how I feel with all this going on and I can't hear." Patient also demonstrates poor safety awarenss.        Exercises     Shoulder Instructions       General Comments Wife present at bedside for family education session.    Pertinent Vitals/ Pain       Pain Assessment: No/denies pain  Home Living                                          Prior Functioning/Environment              Frequency  Min 2X/week        Progress Toward Goals  OT Goals(current goals can now be found in the care plan section)  Progress towards OT goals: Progressing toward goals  Acute Rehab OT Goals Patient Stated Goal: To retun home with wife. OT Goal Formulation: With family Time For Goal Achievement: 10/04/20 Potential to Achieve Goals: Good ADL Goals Pt Will Perform Grooming: with modified independence Pt Will Perform Lower Body Dressing: with modified independence Pt Will Transfer to Toilet: with modified independence;regular height toilet;ambulating Pt Will Perform Toileting - Clothing Manipulation and hygiene: with modified independence;sit to/from stand  Plan Discharge plan remains appropriate;Frequency remains appropriate    Co-evaluation                  AM-PAC OT "6 Clicks" Daily Activity     Outcome Measure   Help from another person eating meals?: A Little Help from another person taking care of personal grooming?: A Little Help from another person toileting, which includes using toliet, bedpan, or urinal?: A Little Help from another person bathing (including washing, rinsing, drying)?: A Little Help from another person to put on and taking off regular upper body clothing?: A Little Help from another person to put on and taking off regular lower body clothing?: A Little 6 Click Score: 18    End of Session Equipment Utilized During Treatment: Gait belt;Rolling walker  OT Visit Diagnosis: Unsteadiness on feet (R26.81);History of falling (Z91.81);Other symptoms and signs involving the nervous system (R29.898);Low vision, both eyes (H54.2)   Activity Tolerance Treatment limited secondary to agitation   Patient Left in chair;with chair alarm set;with family/visitor present   Nurse Communication Mobility status        Time: 1131-1210 OT Time Calculation (min): 39 min  Charges: OT General Charges $OT Visit: 1 Visit OT Treatments $Self Care/Home Management : 23-37 mins $Therapeutic Activity: 8-22 mins  Caitlen Worth H. OTR/L Supplemental OT, Department  of rehab services [phone]   Wilbon Obenchain R H. 09/25/2020, 1:57 PM

## 2020-09-25 NOTE — Progress Notes (Signed)
    NPO past MN Plan will be for right sided transcarotid artery stent on Wednesday.  He is on aspirin and a statin and I will initiate Plavix therapy as well for at least 3 months.  Pre-op orders placed  Mosetta Pigeon PA-C

## 2020-09-25 NOTE — Progress Notes (Signed)
Physical Therapy Treatment Patient Details Name: Vincent Campbell MRN: [id number] DOB: 04-26-1941 Today's Date: 09/25/2020    History of Present Illness 80 y.o. male presenting with lethargy and AMS. Patient admitted with acute encephalopathy and suspicion for PNA. MRI (+) scattered small subacute infarcts involving the right cerebrum, right basal ganglia and anterior corpus callosum. CT (+) at least 90% proximal right ICA narrowing and less than 50% proximal left ICA narrowing. Further work-up pending. 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    PT Comments    Pt ambulated 150' with RW, continues to need cues for safety and attending to position within RW. Pt ascended and descended 5 stairs with alternating pattern up and step to pattern sideways down. Wife educated on how to safely guard him on stairs. PT will continue to follow.    Follow Up Recommendations  Home health PT;Supervision for mobility/OOB     Equipment Recommendations  None recommended by PT    Recommendations for Other Services       Precautions / Restrictions Precautions Precautions: Fall Precaution Comments: High fall risk, very HOH (has hearing amplifier) Restrictions Weight Bearing Restrictions: No    Mobility  Bed Mobility Overal bed mobility: Needs Assistance Bed Mobility: Supine to Sit;Sit to Supine     Supine to sit: Min guard Sit to supine: Min guard   General bed mobility comments: Patient able to advance BLE toward EOB and elevate trunk to sitting with Min guard +rail. Return to supine with min-guard for safety with getting LE's into bed    Transfers Overall transfer level: Needs assistance Equipment used: Rolling walker (2 wheeled) Transfers: Sit to/from Stand Sit to Stand: Min guard         General transfer comment: vc's for hand placement with use of RW  Ambulation/Gait Ambulation/Gait  assistance: Min guard Gait Distance (Feet): 150 Feet Assistive device: Rolling walker (2 wheeled) Gait Pattern/deviations: Decreased step length - right;Decreased step length - left;Trunk flexed;Step-through pattern Gait velocity: decreased Gait velocity interpretation: <1.8 ft/sec, indicate of risk for recurrent falls General Gait Details: pt maintained L hand on RW throughout ambulation but tends to step out of RW, especially with turning. Cues given and education to wife for assisting him safely at home.   Stairs Stairs: Yes Stairs assistance: Min guard Stair Management: One rail Left;Alternating pattern;Step to pattern;Forwards Number of Stairs: 5 General stair comments: alternating pattern fwd with one rail to ascend. Pt with less confidence on the descent and cues to face the rail and hold with both hands and pt adopted step to gait pattern. Wife present for session   Wheelchair Mobility    Modified Rankin (Stroke Patients Only) Modified Rankin (Stroke Patients Only) Pre-Morbid Rankin Score: Slight disability Modified Rankin: Moderately severe disability     Balance Overall balance assessment: Needs assistance Sitting-balance support: Feet supported;No upper extremity supported Sitting balance-Leahy Scale: Good Sitting balance - Comments: supervision for safety.   Standing balance support: During functional activity Standing balance-Leahy Scale: Fair Standing balance comment: Min A to steady without UE support                            Cognition Arousal/Alertness: Awake/alert Behavior During Therapy: WFL for tasks assessed/performed Overall Cognitive Status: Impaired/Different from baseline Area of Impairment: Safety/judgement  Safety/Judgement: Decreased awareness of safety;Decreased awareness of deficits     General Comments: decreased attention to L side and decreased safety awareness      Exercises       General Comments General comments (skin integrity, edema, etc.): wife educated on safest way to guard pt on stairs.      Pertinent Vitals/Pain Pain Assessment: No/denies pain    Home Living                      Prior Function            PT Goals (current goals can now be found in the care plan section) Acute Rehab PT Goals Patient Stated Goal: To retun home with wife. PT Goal Formulation: With patient/family Time For Goal Achievement: 10/05/20 Potential to Achieve Goals: Good Progress towards PT goals: Progressing toward goals    Frequency    Min 3X/week      PT Plan Current plan remains appropriate    Campbell-evaluation              AM-PAC PT "6 Clicks" Mobility   Outcome Measure  Help needed turning from your back to your side while in a flat bed without using bedrails?: None Help needed moving from lying on your back to sitting on the side of a flat bed without using bedrails?: A Little Help needed moving to and from a bed to a chair (including a wheelchair)?: A Little Help needed standing up from a chair using your arms (e.g., wheelchair or bedside chair)?: A Little Help needed to walk in hospital room?: A Little Help needed climbing 3-5 steps with a railing? : A Little 6 Click Score: 19    End of Session   Activity Tolerance: Patient tolerated treatment well Patient left: with family/visitor present;in bed;with bed alarm set;with call bell/phone within reach Nurse Communication: Mobility status PT Visit Diagnosis: Other abnormalities of gait and mobility (R26.89);Unsteadiness on feet (R26.81)     Time: 1191-4782 PT Time Calculation (min) (ACUTE ONLY): 25 min  Charges:  $Gait Training: 23-37 mins                     Vincent Campbell, PT  Acute Rehab Services  Pager [phone] Office [phone]    Vincent Campbell 09/25/2020, 4:17 PM

## 2020-09-25 NOTE — Care Management Important Message (Signed)
Important Message  Patient Details  Name: Vincent Campbell MRN: [id number] Date of Birth: [birth year]   Medicare Important Message Given:  Yes     [name] [name] 09/25/2020, 3:09 PM

## 2020-09-25 NOTE — Plan of Care (Signed)
  Problem: Ischemic Stroke/TIA Tissue Perfusion: Goal: Complications of ischemic stroke/TIA will be minimized Outcome: Progressing   Problem: Skin Integrity: Goal: Risk for impaired skin integrity will decrease Outcome: Progressing   Problem: Safety: Goal: Ability to remain free from injury will improve Outcome: Progressing

## 2020-09-25 NOTE — Progress Notes (Signed)
PROGRESS NOTE    Vincent Campbell  EAV:[id number] DOB: Oct 19, 1940 DOA: 09/20/2020 PCP: Corwin Levins, MD   Brief Narrative:  The patient is a 80 year old obese Caucasian male with a past medical history significant for but not limited to type 2 diabetes mellitus, hypertension, mild renal insufficiency, hypothyroidism, history of peripheral edema as well as other comorbidities who presented with confusion and lethargy. Presents with worsening confusion, lethargy and difficulty ambulating.   Patient noted to have unilateral left weakness, transferred to [facility] for further evaluation, MRI showing scattered subacute infarcts on the 27th, neurology consulted for further evaluation and treatment.  Assessment & Plan:   Principal Problem:   Encephalopathy acute Active Problems:   Essential hypertension   Diabetes (HCC)   Hypothyroidism   CKD (chronic kidney disease), stage II   Alcohol use   Cerebral thrombosis with cerebral infarction   Acute/subacute CVA confirmed on MRI  Right internal carotid artery stenosis -Neurology following, vascular consulted - Right sided transcarotid artery stent on Wednesday -MRI shows scattered subacute infarcts with right carotid artery stenosis, vascular to evaluate for possible stent placement -Patient's weakness appears to be improving per family at bedside -Echocardiogram 60-65% EF without regional wall abnormalities without signs of intracardiac embolism. -CTA head/neck shows small multifocal right cerebral infarcts; 90% proximal right ICA narrowing, left 50%  Acute metabolic encephalopathy, likely multifactorial On chronic likely progressive dementia -Questionably secondary to above -Patient also has profound history of alcohol use, off CIWA protocol - had not scored since admission 4 days ago. -Rule out possible infection as below -continue rocephin and azithromycin for suspected pneumonia given exposure  Alcohol Use, without withdrawals -Per wife  patient drinks liquor every day, unclear amount; concerned that he may be hiding how much he truly drinks from her per previous discussion - he tells me maybe a beer/two per day (unclear if this is accurate). -Alcohol level negative at intake, no signs/symptoms of withdrawal since admission.   Mild AKI on CKD II, resolved -Currently WNL  Type II DM, uncontrolled with hyperglycemia -Previous A1c was 7.7% -Check CBGs and use low-intensity SSI for now -CBGs ranging from 123-236 -Continue monitor blood sugars per protocol and adjust insulin regimen as necessary  Mild hypokalemia, resolved -Currently within normal limits  Hypophosphatemia -In the setting of poor p.o. intake, continue to increase diet as tolerated -Likely due to chronic alcohol use as above  Hyperbilirubinemia, resolved -Currently within normal limits  Obesity -Complicates overall prognosis and care -Estimated body mass index is 30.27 kg/m as calculated from the following:   Height as of this encounter:  (1.727 m).   Weight as of this encounter: 90.3 kg. -Weight Loss and Dietary Counseling given   DVT prophylaxis: Enoxaparin 40 mg sq q24h Code Status: FULL CODE  Family Communication: Discussed with Wife at Bedside at length Disposition Plan: Pending further clinical improvement and evaluation by PT OT  Status is: Inpatient  Remains inpatient appropriate because:Unsafe d/c plan, IV treatments appropriate due to intensity of illness or inability to take PO and Inpatient level of care appropriate due to severity of illness   Dispo: The patient is from: Home              Anticipated d/c is to: Home w/HHPT              Patient currently is not medically stable to d/c.   Difficult to place patient No  Consultants:   None  Procedures:  Tentatively planned right carotid stent  Antimicrobials:  Anti-infectives (From admission, onward)   Start     Dose/Rate Route Frequency Ordered Stop   09/22/20 0400   cefTRIAXone (ROCEPHIN) 2 g in sodium chloride 0.9 % 100 mL IVPB        2 g 200 mL/hr over 30 Minutes Intravenous Every 24 hours 09/21/20 0353 09/25/20 0505   09/22/20 0400  azithromycin (ZITHROMAX) 500 mg in sodium chloride 0.9 % 250 mL IVPB        500 mg 250 mL/hr over 60 Minutes Intravenous Every 24 hours 09/21/20 0353 09/25/20 0540   09/21/20 0400  cefTRIAXone (ROCEPHIN) 1 g in sodium chloride 0.9 % 100 mL IVPB        1 g 200 mL/hr over 30 Minutes Intravenous  Once 09/21/20 0349 09/21/20 0717   09/21/20 0400  azithromycin (ZITHROMAX) 500 mg in sodium chloride 0.9 % 250 mL IVPB        500 mg 250 mL/hr over 60 Minutes Intravenous  Once 09/21/20 0349 09/21/20 0717        Subjective: No acute issues or events overnight, patient transferred to [facility] without any incident or delays.  Patient now evaluated by neurology and vascular surgery, denies nausea vomiting diarrhea constipation headache fevers or chills.  Weakness appears to be resolving although review of systems somewhat limited due to patient's limited hearing and likely underlying dementia.  Wife is primary point of contact and at bedside this morning states patient appears quite well.  Objective: Vitals:   09/24/20 1500 09/24/20 2014 09/24/20 2359 09/25/20 0419  BP: (!) 160/86 (!) 158/97 (!) 152/89 (!) 149/84  Pulse: 81 87 83 91  Resp: 18  20 20   Temp: 98 F (36.7 C) 98 F (36.7 C) 98.4 F (36.9 C) (!) 97.5 F (36.4 C)  TempSrc: Axillary Oral Oral Oral  SpO2: 96% 96% 97% 94%  Weight:      Height:        Intake/Output Summary (Last 24 hours) at 09/25/2020 0728 Last data filed at 09/25/2020 0600 Gross per 24 hour  Intake 240 ml  Output 300 ml  Net -60 ml   Filed Weights   09/20/20 2306  Weight: 90.3 kg   Examination: Physical Exam:  General:  Pleasantly resting in bed, No acute distress. HEENT:  Normocephalic atraumatic.  Sclerae nonicteric, noninjected.  Extraocular movements intact bilaterally. Neck:   Without mass or deformity.  Trachea is midline. Lungs:  Clear to auscultate bilaterally without rhonchi, wheeze, or rales. Heart:  Regular rate and rhythm.  Without murmurs, rubs, or gallops. Abdomen:  Soft, nontender, nondistended.  Without guarding or rebound. Extremities: Without cyanosis, clubbing, edema, or obvious deformity. Neuro: 4 out of 5 strength globally without overt deficits, patient denies any sensory deficits at this point Vascular:  Dorsalis pedis and posterior tibial pulses palpable bilaterally. Skin:  Warm and dry, no erythema, no ulcerations.   Data Reviewed: I have personally reviewed following labs and imaging studies  CBC: Recent Labs  Lab 09/20/20 2325 09/21/20 0441 09/22/20 0459 09/23/20 0528 09/23/20 1654 09/25/20 0355  WBC 15.9* 14.6* 12.8* 13.4* 13.1* 11.7*  NEUTROABS 11.6*  --  8.2* 8.9*  --   --   HGB 13.9 13.2 13.1 13.7 13.7 13.3  HCT 41.8 39.2 39.4 40.6 39.1 39.9  MCV 99.1 98.2 99.7 98.1 95.6 97.3  PLT 260 223 207 231 242 263   Basic Metabolic Panel: Recent Labs  Lab 09/20/20 2325 09/21/20 0441 09/22/20 0459 09/23/20 0528 09/25/20 0355  NA 137  137 138 136 139  K 3.6 3.5 3.4* 3.9 3.6  CL 101 102 104 103 104  CO2 25 26 23 23 26   GLUCOSE 137* 140* 107* 122* 111*  BUN 18 15 12 11 11   CREATININE 1.23 1.03 0.90 0.84 0.93  CALCIUM 9.2 8.7* 8.3* 8.2* 8.5*  MG  --   --  2.1 2.0  --   PHOS  --   --  2.7 2.4*  --    GFR: Estimated Creatinine Clearance: 70.3 mL/min (by C-G formula based on SCr of 0.93 mg/dL). Liver Function Tests: Recent Labs  Lab 09/20/20 2325 09/22/20 0459 09/23/20 0528 09/25/20 0355  AST 23 22 20 18   ALT 19 18 18 18   ALKPHOS 53 54 54 54  BILITOT 1.2 1.4* 1.2 1.0  PROT 7.8 6.7 6.5 6.2*  ALBUMIN 4.2 3.7 3.4* 3.1*   No results for input(s): LIPASE, AMYLASE in the last 168 hours. Recent Labs  Lab 09/21/20 0441  AMMONIA 13   Coagulation Profile: Recent Labs  Lab 09/20/20 2325  INR 1.3*   Cardiac Enzymes: No  results for input(s): CKTOTAL, CKMB, CKMBINDEX, TROPONINI in the last 168 hours. BNP (last 3 results) No results for input(s): PROBNP in the last 8760 hours. HbA1C: Recent Labs    09/23/20 1654  HGBA1C 7.0*   CBG: Recent Labs  Lab 09/24/20 1140 09/24/20 1533 09/24/20 2210 09/25/20 0014 09/25/20 0423  GLUCAP 186* 102* 121* 109* 109*   Lipid Profile: Recent Labs    09/23/20 1654  CHOL 134  HDL 56  LDLCALC 64  TRIG 71  CHOLHDL 2.4   Thyroid Function Tests: No results for input(s): TSH, T4TOTAL, FREET4, T3FREE, THYROIDAB in the last 72 hours. Anemia Panel: No results for input(s): VITAMINB12, FOLATE, FERRITIN, TIBC, IRON, RETICCTPCT in the last 72 hours. Sepsis Labs: Recent Labs  Lab 09/20/20 2325 09/21/20 0131 09/21/20 0441 09/22/20 0459 09/23/20 0528  PROCALCITON  --   --  <0.10 <0.10 <0.10  LATICACIDVEN 1.9 1.7  --   --   --     Recent Results (from the past 240 hour(s))  Blood culture (routine single)     Status: None (Preliminary result)   Collection Time: 09/20/20 11:25 PM   Specimen: BLOOD  Result Value Ref Range Status   Specimen Description   Final    BLOOD SPECIMEN SOURCE NOT MARKED ON REQUISITION Performed at [facility], [address] [place], [place]    Special Requests   Final    BOTTLES DRAWN AEROBIC AND ANAEROBIC Blood Culture results may not be optimal due to an inadequate volume of blood received in culture bottles Performed at [facility], [address] [place], [place]    Culture   Final    NO GROWTH 3 DAYS Performed at [facility], [address], [place], [address] [facility]    Report Status PENDING  Incomplete  Resp Panel by RT-PCR (Flu A&B, Covid) Nasopharyngeal Swab     Status: None   Collection Time: 09/20/20 11:57 PM   Specimen: Nasopharyngeal Swab; Nasopharyngeal(NP) swabs in vial transport medium  Result Value Ref Range Status   SARS Coronavirus 2 by RT PCR  NEGATIVE NEGATIVE Final    Comment: (NOTE) SARS-CoV-2 target nucleic acids are NOT DETECTED.  The SARS-CoV-2 RNA is generally detectable in upper respiratory specimens during the acute phase of infection. The lowest concentration of SARS-CoV-2 viral copies this assay can detect is 138 copies/mL. A negative  result does not preclude SARS-Cov-2 infection and should not be used as the sole basis for treatment or other patient management decisions. A negative result may occur with  improper specimen collection/handling, submission of specimen other than nasopharyngeal swab, presence of viral mutation(s) within the areas targeted by this assay, and inadequate number of viral copies(<138 copies/mL). A negative result must be combined with clinical observations, patient history, and epidemiological information. The expected result is Negative.  Fact Sheet for Patients:  BloggerCourse.comhttps://www.fda.gov/media/152166/download  Fact Sheet for Healthcare Providers:  SeriousBroker.ithttps://www.fda.gov/media/152162/download  This test is no t yet approved or cleared by the [place] FDA and  has been authorized for detection and/or diagnosis of SARS-CoV-2 by FDA under an Emergency Use Authorization (EUA). This EUA will remain  in effect (meaning this test can be used) for the duration of the COVID-19 declaration under Section 564(b)(1) of the Act, 21 U.S.C.section 360bbb-3(b)(1), unless the authorization is terminated  or revoked sooner.       Influenza A by PCR NEGATIVE NEGATIVE Final   Influenza B by PCR NEGATIVE NEGATIVE Final    Comment: (NOTE) The Xpert Xpress SARS-CoV-2/FLU/RSV plus assay is intended as an aid in the diagnosis of influenza from Nasopharyngeal swab specimens and should not be used as a sole basis for treatment. Nasal washings and aspirates are unacceptable for Xpert Xpress SARS-CoV-2/FLU/RSV testing.  Fact Sheet for Patients: BloggerCourse.comhttps://www.fda.gov/media/152166/download  Fact Sheet for  Healthcare Providers: SeriousBroker.ithttps://www.fda.gov/media/152162/download  This test is not yet approved or cleared by the [place] FDA and has been authorized for detection and/or diagnosis of SARS-CoV-2 by FDA under an Emergency Use Authorization (EUA). This EUA will remain in effect (meaning this test can be used) for the duration of the COVID-19 declaration under Section 564(b)(1) of the Act, 21 U.S.C. section 360bbb-3(b)(1), unless the authorization is terminated or revoked.  Performed at [facility], [address] [place], [place] [address]   Urine culture     Status: None   Collection Time: 09/21/20  2:00 AM   Specimen: In/Out Cath Urine  Result Value Ref Range Status   Specimen Description   Final    IN/OUT CATH URINE Performed at [facility], [address] [place], [place] [address]    Special Requests   Final    NONE Performed at [facility], [address] [place], [place] [address]    Culture   Final    NO GROWTH Performed at [facility], [address] [place], [place] [address]    Report Status 09/22/2020 FINAL  Final  Culture, blood (routine x 2)     Status: None (Preliminary result)   Collection Time: 09/23/20  3:33 PM   Specimen: BLOOD RIGHT HAND  Result Value Ref Range Status   Specimen Description   Final    BLOOD RIGHT HAND Performed at [facility], [address] [place], [place] [address]    Special Requests   Final    BOTTLES DRAWN AEROBIC ONLY Blood Culture adequate volume Performed at [facility], [address] [place], [place] [address]    Culture   Final    NO GROWTH < 24 HOURS Performed at [facility], [address] [place], [place] [address]    Report Status PENDING  Incomplete  Culture, blood (routine x 2)     Status: None (Preliminary result)   Collection Time: 09/23/20  3:38 PM   Specimen: BLOOD LEFT HAND  Result  Value Ref Range Status   Specimen Description   Final    BLOOD LEFT HAND Performed at [facility], [address], [place], [place] [address]    Special Requests   Final    BOTTLES DRAWN AEROBIC AND ANAEROBIC Blood Culture adequate volume Performed at [facility], [address] [place], [place] [address]    Culture   Final    NO GROWTH < 24 HOURS Performed at [facility], [address], [place], [place] [address]    Report Status PENDING  Incomplete    RN Pressure Injury Documentation:     Estimated body mass index is 30.27 kg/m as calculated from the following:   Height as of this encounter:  (1.727 m).   Weight as of this encounter: 90.3 kg.  Malnutrition Type:   Malnutrition Characteristics:   Nutrition Interventions:    Radiology Studies: CT ANGIO HEAD W OR WO CONTRAST  Result Date: 09/23/2020 CLINICAL DATA:  Stroke/TIA, assess intracranial arteries EXAM: CT ANGIOGRAPHY HEAD AND NECK TECHNIQUE: Multidetector CT imaging of the head and neck was performed using the standard protocol during bolus administration of intravenous contrast. Multiplanar CT image reconstructions and MIPs were obtained to evaluate the vascular anatomy. Carotid stenosis measurements (when applicable) are obtained utilizing NASCET criteria, using the distal internal carotid diameter as the denominator. CONTRAST:  80mL OMNIPAQUE IOHEXOL 350 MG/ML SOLN COMPARISON:  09/23/2020 and prior. FINDINGS: CT HEAD FINDINGS Brain: Scattered small acute infarcts involving the right cerebrum, basal ganglia and anterior corpus callosum are better demonstrated on same day MRI. No mass lesion. No midline shift, ventriculomegaly or extra-axial fluid collection. Mild cerebral atrophy with ex vacuo dilatation and chronic microvascular ischemic changes. Chronic left corona radiata and right basal ganglia lacunar insults. Vascular: No hyperdense vessel or unexpected  calcification. Skull: Negative for fracture or focal lesion. Sinuses/Orbits: Normal orbits. Small left sphenoid sinus mucous retention cyst. No mastoid effusion. Other: None. Review of the MIP images confirms the above findings CTA NECK FINDINGS Aortic arch: Standard branching. Patent great vessel origins. No evidence of dissection. Aortic arch atheromatous disease with plaque ulceration along the inferior transverse segment. Right carotid system: Patent CCA. Bifurcation atheromatous disease with at least 90% proximal ICA narrowing. Left carotid system: Patent. Bifurcation atheromatous disease with less than 50% proximal ICA narrowing. Vertebral arteries: Dominant left vertebral artery.  Patent. Skeleton: No acute finding. Chronic left rib deformity. Multilevel spondylosis. Other neck: No adenopathy.  No soft tissue mass. Upper chest: Upper lung atelectasis. Review of the MIP images confirms the above findings CTA HEAD FINDINGS Anterior circulation: Bilateral carotid siphon atherosclerotic calcifications with mild right supraclinoid ICA narrowing. Patent ACAs and MCAs. Posterior circulation: Mild-to-moderate right V4 segment narrowing. Patent basilar and superior cerebellar arteries. Patent PICA. Patent bilateral PCAs with mild narrowing and irregularity on the left. Venous sinuses: As permitted by contrast timing, patent. Anatomic variants: None. Review of the MIP images confirms the above findings IMPRESSION: Head CT: Multifocal small right cerebrum infarcts are better demonstrated on same day MRI. Chronic right basal ganglia and left corona radiata lacunar insults. CTA neck: At least 90% proximal right ICA narrowing. Less than 50% proximal left ICA narrowing. CTA head: Mild to moderate right V4 segment narrowing. Mild right supraclinoid ICA and left PCA narrowing. These results will be called to the ordering clinician or representative by the Radiologist Assistant, and communication documented in the PACS or  [employer]. Electronically Signed   By: Allegra Lai  [place] M.D.   On: 09/23/2020 19:55   CT ANGIO NECK W OR WO CONTRAST  Result Date: 09/23/2020 CLINICAL DATA:  Stroke/TIA, assess intracranial arteries EXAM: CT ANGIOGRAPHY HEAD AND NECK TECHNIQUE: Multidetector CT imaging of the head and neck was performed using the standard protocol during bolus administration of intravenous contrast. Multiplanar CT image reconstructions and MIPs were obtained to evaluate the vascular anatomy. Carotid stenosis measurements (when applicable) are obtained utilizing NASCET criteria, using the distal internal carotid diameter as the denominator. CONTRAST:  80mL OMNIPAQUE IOHEXOL 350 MG/ML SOLN COMPARISON:  09/23/2020 and prior. FINDINGS: CT HEAD FINDINGS Brain: Scattered small acute infarcts involving the right cerebrum, basal ganglia and anterior corpus callosum are better demonstrated on same day MRI. No mass lesion. No midline shift, ventriculomegaly or extra-axial fluid collection. Mild cerebral atrophy with ex vacuo dilatation and chronic microvascular ischemic changes. Chronic left corona radiata and right basal ganglia lacunar insults. Vascular: No hyperdense vessel or unexpected calcification. Skull: Negative for fracture or focal lesion. Sinuses/Orbits: Normal orbits. Small left sphenoid sinus mucous retention cyst. No mastoid effusion. Other: None. Review of the MIP images confirms the above findings CTA NECK FINDINGS Aortic arch: Standard branching. Patent great vessel origins. No evidence of dissection. Aortic arch atheromatous disease with plaque ulceration along the inferior transverse segment. Right carotid system: Patent CCA. Bifurcation atheromatous disease with at least 90% proximal ICA narrowing. Left carotid system: Patent. Bifurcation atheromatous disease with less than 50% proximal ICA narrowing. Vertebral arteries: Dominant left vertebral artery.  Patent. Skeleton: No acute finding. Chronic left rib  deformity. Multilevel spondylosis. Other neck: No adenopathy.  No soft tissue mass. Upper chest: Upper lung atelectasis. Review of the MIP images confirms the above findings CTA HEAD FINDINGS Anterior circulation: Bilateral carotid siphon atherosclerotic calcifications with mild right supraclinoid ICA narrowing. Patent ACAs and MCAs. Posterior circulation: Mild-to-moderate right V4 segment narrowing. Patent basilar and superior cerebellar arteries. Patent PICA. Patent bilateral PCAs with mild narrowing and irregularity on the left. Venous sinuses: As permitted by contrast timing, patent. Anatomic variants: None. Review of the MIP images confirms the above findings IMPRESSION: Head CT: Multifocal small right cerebrum infarcts are better demonstrated on same day MRI. Chronic right basal ganglia and left corona radiata lacunar insults. CTA neck: At least 90% proximal right ICA narrowing. Less than 50% proximal left ICA narrowing. CTA head: Mild to moderate right V4 segment narrowing. Mild right supraclinoid ICA and left PCA narrowing. These results will be called to the ordering clinician or representative by the Radiologist Assistant, and communication documented in the PACS or [employer]. Electronically Signed   By: Stana Bunting M.D.   On: 09/23/2020 19:55   MR BRAIN WO CONTRAST  Result Date: 09/23/2020 CLINICAL DATA:  Mental status change, unknown cause EXAM: MRI HEAD WITHOUT CONTRAST TECHNIQUE: Multiplanar, multiecho pulse sequences of the brain and surrounding structures were obtained without intravenous contrast. COMPARISON:  09/21/2020 and prior. FINDINGS: Brain: Scattered small DWI hyperintensities predominantly involving the right cerebral watershed territories, anterior corpus callosum and right basal ganglia. No intracranial hemorrhage. No midline shift, ventriculomegaly or extra-axial fluid collection. No mass lesion. Mild cerebral atrophy with ex vacuo dilatation. Mild chronic  microvascular ischemic changes. Chronic right basal ganglia and left corona radiata lacunar insults. Vascular: Major intracranial flow voids are preserved at the skull base. Skull and upper cervical spine: Normal marrow signal. Sinuses/Orbits: Sequela of bilateral lens replacement. Clear paranasal sinuses and mastoid air cells. Other: None. IMPRESSION: Scattered small subacute infarcts involving the right cerebrum,  right basal ganglia and anterior corpus callosum. Consider CTA head and neck. Chronic right basal ganglia and left corona radiata lacunar insults. Mild chronic microvascular ischemic changes. These results were called by telephone at the time of interpretation on 09/23/2020 at 2:50 pm to provider [facility] , who verbally acknowledged these results. Electronically Signed   By: Stana Bunting M.D.   On: 09/23/2020 14:53   DG CHEST PORT 1 VIEW  Result Date: 09/23/2020 CLINICAL DATA:  80 year old male with history of confusion and shortness of breath. EXAM: PORTABLE CHEST 1 VIEW COMPARISON:  Chest x-ray 09/20/2020. FINDINGS: Lung volumes are low. No consolidative airspace disease. No pleural effusions. No pneumothorax. No pulmonary nodule or mass noted. Pulmonary vasculature and the cardiomediastinal silhouette are within normal limits. IMPRESSION: 1. Low lung volumes without radiographic evidence of acute cardiopulmonary disease. Electronically Signed   By: Trudie Reed M.D.   On: 09/23/2020 14:59   ECHOCARDIOGRAM COMPLETE  Result Date: 09/24/2020    ECHOCARDIOGRAM REPORT   Patient Name:   Vincent Campbell Date of Exam: 09/24/2020 Medical Rec #:  [id number]       Height:       68.0 in Accession #:    [id number]      Weight:       199.1 lb Date of Birth:  1940-09-13       BSA:          2.040 m Patient Age:    79 years        BP:           154/87 mmHg Patient Gender: M               HR:           83 bpm. Exam Location:  Inpatient Procedure: 2D Echo, Cardiac Doppler, Color Doppler and  Intracardiac            Opacification Agent Indications:     Stroke I63.9  History:         Patient has no prior history of Echocardiogram examinations.                  Risk Factors:Hypertension, Dyslipidemia and Current Smoker.  Sonographer:     Renella Cunas RDCS Referring Phys:  [id number] Kateri Mc LATIF [facility] Diagnosing Phys: Tobias Alexander MD  Sonographer Comments: Technically difficult study due to poor echo windows. IMPRESSIONS  1. Left ventricular ejection fraction, by estimation, is 60 to 65%. The left ventricle has normal function. The left ventricle has no regional wall motion abnormalities. Left ventricular diastolic parameters are consistent with Grade I diastolic dysfunction (impaired relaxation).  2. Right ventricular systolic function is normal. The right ventricular size is normal. There is normal pulmonary artery systolic pressure.  3. Left atrial size was mildly dilated.  4. The mitral valve is normal in structure. No evidence of mitral valve regurgitation. No evidence of mitral stenosis.  5. The aortic valve is normal in structure. Aortic valve regurgitation is not visualized. No aortic stenosis is present.  6. The inferior vena cava is normal in size with <50% respiratory variability, suggesting right atrial pressure of 8 mmHg. Conclusion(s)/Recommendation(s): No intracardiac source of embolism detected on this transthoracic study. A transesophageal echocardiogram is recommended to exclude cardiac source of embolism if clinically indicated. FINDINGS  Left Ventricle: Left ventricular ejection fraction, by estimation, is 60 to 65%. The left ventricle has normal function. The left ventricle has no regional wall motion abnormalities. Definity contrast agent was given  IV to delineate the left ventricular  endocardial borders. The left ventricular internal cavity size was normal in size. There is no left ventricular hypertrophy. Left ventricular diastolic parameters are consistent with Grade I diastolic  dysfunction (impaired relaxation). Normal left ventricular filling pressure. Right Ventricle: The right ventricular size is normal. No increase in right ventricular wall thickness. Right ventricular systolic function is normal. There is normal pulmonary artery systolic pressure. Left Atrium: Left atrial size was mildly dilated. Right Atrium: Right atrial size was normal in size. Pericardium: There is no evidence of pericardial effusion. Mitral Valve: The mitral valve is normal in structure. No evidence of mitral valve regurgitation. No evidence of mitral valve stenosis. Tricuspid Valve: The tricuspid valve is normal in structure. Tricuspid valve regurgitation is not demonstrated. No evidence of tricuspid stenosis. Aortic Valve: The aortic valve is normal in structure. Aortic valve regurgitation is not visualized. No aortic stenosis is present. Pulmonic Valve: The pulmonic valve was normal in structure. Pulmonic valve regurgitation is not visualized. No evidence of pulmonic stenosis. Aorta: The aortic root is normal in size and structure. Venous: The inferior vena cava is normal in size with less than 50% respiratory variability, suggesting right atrial pressure of 8 mmHg. IAS/Shunts: No atrial level shunt detected by color flow Doppler.  LEFT VENTRICLE PLAX 2D LVIDd:         4.20 cm  Diastology LVIDs:         3.00 cm  LV e' medial:    6.24 cm/s LV PW:         0.80 cm  LV E/e' medial:  11.3 LV IVS:        0.80 cm  LV e' lateral:   6.81 cm/s LVOT diam:     2.20 cm  LV E/e' lateral: 10.3 LV SV:         41 LV SV Index:   20 LVOT Area:     3.80 cm  RIGHT VENTRICLE RV S prime:     10.60 cm/s TAPSE (M-mode): 1.2 cm LEFT ATRIUM           Index       RIGHT ATRIUM           Index LA diam:      4.50 cm 2.21 cm/m  RA Area:     14.80 cm LA Vol (A2C): 36.3 ml 17.80 ml/m RA Volume:   38.50 ml  18.88 ml/m LA Vol (A4C): 45.3 ml 22.21 ml/m  AORTIC VALVE LVOT Vmax:   58.70 cm/s LVOT Vmean:  42.400 cm/s LVOT VTI:    0.108 m   AORTA Ao Root diam: 3.40 cm MITRAL VALVE MV Area (PHT): 4.15 cm    SHUNTS MV Decel Time: 183 msec    Systemic VTI:  0.11 m MV E velocity: 70.30 cm/s  Systemic Diam: 2.20 cm MV A velocity: 69.00 cm/s MV E/A ratio:  1.02 Tobias Alexander MD Electronically signed by Tobias Alexander MD Signature Date/Time: 09/24/2020/10:37:33 AM    Final (Updated)    VAS US CAROTID  Result Date: 09/24/2020 Carotid Arterial Duplex Study Indications:       CVA and 90% ICA narrowing by CTA of neck 09/23/20. Risk Factors:      Hypertension, Diabetes. Other Factors:     ETOH abuse. Comparison Study:  No prior study on file Performing Technologist: Sherren Kerns RVS  Examination Guidelines: A complete evaluation includes B-mode imaging, spectral Doppler, color Doppler, and power Doppler as needed of all accessible portions of each  vessel. Bilateral testing is considered an integral part of a complete examination. Limited examinations for reoccurring indications may be performed as noted.  Right Carotid Findings: +----------+--------+--------+--------+-----------------+----------------------+           PSV cm/sEDV cm/sStenosisPlaque           Comments                                                 Description                             +----------+--------+--------+--------+-----------------+----------------------+ CCA Prox  197     62                                                      +----------+--------+--------+--------+-----------------+----------------------+ CCA Distal77      25                               intimal thickening     +----------+--------+--------+--------+-----------------+----------------------+ ICA Prox  395     163     80-99%  heterogenous     thrombus versus plaque +----------+--------+--------+--------+-----------------+----------------------+ ICA Mid   88      20                                                       +----------+--------+--------+--------+-----------------+----------------------+ ICA Distal54      24                                                      +----------+--------+--------+--------+-----------------+----------------------+ ECA       145     26              heterogenous                            +----------+--------+--------+--------+-----------------+----------------------+ +----------+--------+-------+--------+-------------------+           PSV cm/sEDV cmsDescribeArm Pressure (mmHG) +----------+--------+-------+--------+-------------------+ [id number]                                         +----------+--------+-------+--------+-------------------+ +---------+--------+--+--------+-+ VertebralPSV cm/s29EDV cm/s6 +---------+--------+--+--------+-+  Left Carotid Findings: +----------+--------+--------+--------+------------------+--------+           PSV cm/sEDV cm/sStenosisPlaque DescriptionComments +----------+--------+--------+--------+------------------+--------+ CCA Prox  75      17                                         +----------+--------+--------+--------+------------------+--------+ CCA Distal68      16                                         +----------+--------+--------+--------+------------------+--------+  ICA Prox  52      14      1-39%   heterogenous               +----------+--------+--------+--------+------------------+--------+ ICA Distal57      21                                         +----------+--------+--------+--------+------------------+--------+ ECA       102     14              heterogenous               +----------+--------+--------+--------+------------------+--------+ +----------+--------+--------+--------+-------------------+           PSV cm/sEDV cm/sDescribeArm Pressure (mmHG) +----------+--------+--------+--------+-------------------+ Subclavian109                                          +----------+--------+--------+--------+-------------------+ +---------+--------+--+--------+--+ VertebralPSV cm/s55EDV cm/s16 +---------+--------+--+--------+--+   Summary: Right Carotid: Velocities in the right ICA are consistent with a 80-99%                stenosis. Left Carotid: Velocities in the left ICA are consistent with a 1-39% stenosis. Vertebrals:  Bilateral vertebral arteries demonstrate antegrade flow. Subclavians: Normal flow hemodynamics were seen in bilateral subclavian              arteries. *See table(s) above for measurements and observations.  Electronically signed by Delia Heady MD on 09/24/2020 at 1:45:21 PM.    Final    Scheduled Meds: . aspirin EC  81 mg Oral Daily  . clopidogrel  75 mg Oral Daily  . enoxaparin (LOVENOX) injection  40 mg Subcutaneous Q24H  . folic acid  1 mg Oral Daily  . guaiFENesin  1,200 mg Oral BID  . insulin aspart  0-9 Units Subcutaneous Q4H  . multivitamin with minerals  1 tablet Oral Daily  . pravastatin  40 mg Oral Daily  . thiamine  100 mg Oral Daily   Or  . thiamine  100 mg Intravenous Daily   Continuous Infusions:   LOS: 4 days   Azucena Fallen, DO [facility] Pager: Secure chat  If 7PM-7AM, please contact night-coverage [email or web address]

## 2020-09-25 NOTE — Plan of Care (Signed)

## 2020-09-25 NOTE — Progress Notes (Signed)
STROKE TEAM PROGRESS NOTE   INTERVAL HISTORY Patient is getting a sponge bath in bed.Marland Kitchen  His wife is at the bedside.  States is doing well.  He has no new complaints.  Vascular surgery planned elective right carotid revascularization with TCAR procedure on Wednesday. Vitals:   09/24/20 2359 09/25/20 0419 09/25/20 0807 09/25/20 1142  BP: (!) 152/89 (!) 149/84 (!) 145/82 (!) 146/88  Pulse: 83 91 91 85  Resp: 20 20 20 20   Temp: 98.4 F (36.9 C) (!) 97.5 F (36.4 C) 98.4 F (36.9 C) 98 F (36.7 C)  TempSrc: Oral Oral Oral Oral  SpO2: 97% 94% 98% 98%  Weight:      Height:       CBC:  Recent Labs  Lab 09/22/20 0459 09/23/20 0528 09/23/20 1654 09/25/20 0355  WBC 12.8* 13.4* 13.1* 11.7*  NEUTROABS 8.2* 8.9*  --   --   HGB 13.1 13.7 13.7 13.3  HCT 39.4 40.6 39.1 39.9  MCV 99.7 98.1 95.6 97.3  PLT 207 231 242 263   Basic Metabolic Panel:  Recent Labs  Lab 09/22/20 0459 09/23/20 0528 09/25/20 0355  NA 138 136 139  K 3.4* 3.9 3.6  CL 104 103 104  CO2 23 23 26   GLUCOSE 107* 122* 111*  BUN 12 11 11   CREATININE 0.90 0.84 0.93  CALCIUM 8.3* 8.2* 8.5*  MG 2.1 2.0  --   PHOS 2.7 2.4*  --    Lipid Panel:  Recent Labs  Lab 09/23/20 1654  CHOL 134  TRIG 71  HDL 56  CHOLHDL 2.4  VLDL 14  LDLCALC 64   HgbA1c:  Recent Labs  Lab 09/23/20 1654  HGBA1C 7.0*   Urine Drug Screen: No results for input(s): LABOPIA, COCAINSCRNUR, LABBENZ, AMPHETMU, THCU, LABBARB in the last 168 hours.  Alcohol Level  Recent Labs  Lab 09/21/20 0440  ETH <10    IMAGING past 24 hours CT ANGIO HEAD W OR WO CONTRAST  Result Date: 09/23/2020  IMPRESSION: Head CT: Multifocal small right cerebrum infarcts are better demonstrated on same day MRI. Chronic right basal ganglia and left corona radiata lacunar insults. CTA neck: At least 90% proximal right ICA narrowing. Less than 50% proximal left ICA narrowing. CTA head: Mild to moderate right V4 segment narrowing. Mild right supraclinoid ICA and  left PCA narrowing. These results will be called to the ordering clinician or representative by the Radiologist Assistant, and communication documented in the PACS or 09/25/20. Electronically Signed   By: 09/23/20 M.D.   On: 09/23/2020 19:55   CT ANGIO NECK W OR WO CONTRAST  Result Date: 09/23/2020  IMPRESSION: Head CT: Multifocal small right cerebrum infarcts are better demonstrated on same day MRI. Chronic right basal ganglia and left corona radiata lacunar insults. CTA neck: At least 90% proximal right ICA narrowing. Less than 50% proximal left ICA narrowing. CTA head: Mild to moderate right V4 segment narrowing. Mild right supraclinoid ICA and left PCA narrowing.  MR BRAIN WO CONTRAST  Result Date: 09/23/2020  IMPRESSION: Scattered small subacute infarcts involving the right cerebrum, right basal ganglia and anterior corpus callosum. Consider CTA head and neck. Chronic right basal ganglia and left corona radiata lacunar insults. Mild chronic microvascular ischemic changes.  DG CHEST PORT 1 VIEW  Result Date: 09/23/2020 IMPRESSION: 1. Low lung volumes without radiographic evidence of acute cardiopulmonary disease.   VAS 09/25/2020 CAROTID  Result Date: 09/24/2020 Summary: Right Carotid: Velocities in the right ICA are consistent with  a 80-99%                stenosis. Left Carotid: Velocities in the left ICA are consistent with a 1-39% stenosis. Vertebrals:  Bilateral vertebral arteries demonstrate antegrade flow. Subclavians: Normal flow hemodynamics were seen in bilateral subclavian              arteries. *See table(s) above for measurements and observations.     Preliminary    Result Date 09/21/2020 CT Cspine 1. Moderate to marked severity degenerative changes at the levels of C5-C6 and C6-C7. 2. No evidence of an acute fracture or subluxation.   PHYSICAL EXAM Physical Exam  Constitutional: Appears well-developed and well-nourished.  Psych: Affect appropriate to situation.  Cooperative with examination.  Eyes: Normal conjunctiva HENT: No OP obstruction, hearing aid in place right ear, hard of hearing at baseline Head: Normocephalic and atraumatic without obvious deformity Cardiovascular: Extremities warm, well perfused. 2+ edema in bilateral lower extremities.  Respiratory: Effort normal, non-labored breathing.  GI: Soft.  Rounded. There is no tenderness.  Skin: WDI  Neuro: Mental Status: Patient is awake, alert, oriented to person, and situation. He is able to state name correctly. Patient is able to provide some details of current history with some confusion noted. He stated that he has had problems with function in his left hand, arm, and leg. Follows commands with some mimic of examiner due to patient hard of hearing.  No signs of aphasia or neglect noted. Cranial Nerves: II: Visual Fields are full. Pupils are equal, round, and reactive to light 2 mm/brisk III,IV, VI: EOMI without ptosis. V: Facial sensation is symmetric to light touch.  VII: Face is symmetric resting and smiling. Subtle palmomental reflex noted.  VIII: patient is hard of hearing no hearing aid in place in the right ear X: Phonation normal.  XI: Shoulder shrug is symmetric. XII: Tongue protrudes midline. Motor: Tone is normal. Bulk is normal. 5/5 strength in right upper and lower extremities and left lower extremity. Left upper extremity with 4+/5 strength. No pronator drift noted.  Sensory: Sensation is symmetric to light touch in the arms and legs. Cerebellar: FNF diminished  ASSESSMENT/PLAN Mr. Fallon Howerter Fahr is a 80 y.o. male with history of hypertension, hyperlipidemia, hypothyroidism, mild renal insufficiency, and type 2 diabetes who presented to [facility] on 2/25 for evaluation of lethargy and confusion.   At home he was noted to be more confused and forgetful.  He was taken to the ED.  ED work-up reveled oxygen saturation in the low 90s on room air, chest x-ray with bibasilar  opacities, a creatinine of 1.23 but with a leukocytosis with WBC of 15,900. He was initiated on antibiotics and admitted for further work-up.  An MRI was obtained 2/27 for further evaluation of confusion with evidence of scattered small subacute infarcts involving the right cerebrum, right basal ganglia, and anterior corpus callosum with chronic right basal ganglia and left corona radiata lacunar infarcts.  Stroke: Scattered small subacute infarcts involving the right cerebrum, right basal ganglia and anterior corpus callosum likely due to right carotid stenosis.  CT head: Generalized cerebral atrophy.  No acute intracranial abnormality.    CTA head Mild to moderate right V4 segment narrowing.  Mild right supraclinoid ICA and left PCA narrowing  CTA neck At least 90% proximal right ICA narrowing. Less than 50% proximal  left ICA narrowing.  CT perfusion not done   MRI Scattered small subacute infarcts involving the right cerebrum, right basal ganglia and anterior  corpus callosum. Chronic right basal ganglia and left corona radiata lacunar insults.  Mild chronic microvascular ischemic changes  Carotid Doppler right ICA are consistent with a 80-99% stenosis. left ICA 1-39% stenosis  2D Echo EF 60-65%, Grade 1 diastolic dysfunction  LDL 64  PIRJ1O 7.0  VTE prophylaxis - Lovenox 40mg  daily  aspirin 81 mg daily prior to admission, now on aspirin 81 mg daily and clopidogrel 75 mg daily for at least 3 months per vascular surgery  Therapy recommendations:  Home health PT, home health OT Supervision/Assistance - 24 hour   Disposition:  pending  Right Carotid artery stenosis  >80%  vasculary surgery consulted, appreciate recs  Aspirin and plavix for at least 3 months  Planning for right sided Transcarotid artery stent on Wednesday  Hypertension  Home meds: HCTZ 12.5 daily, losartan 100mg  daily, propranolol 10mg  bid  Stable . Long-term BP goal  normotensive  Hyperlipidemia  Home meds:  Pravastatin 40mg  daily, resumed in hospital  LDL 64, goal < 70  Continue statin at discharge  Diabetes type II Controlled  Home meds:  Metformin 1500mg  daily  HgbA1c 7.0, goal < 7.0  CBGs Recent Labs    09/25/20 0423 09/25/20 0811 09/25/20 1142  GLUCAP 109* 120* 131*      SSI  Other Stroke Risk Factors  Advanced Age >/= 73   Cigarette smoker, former, quit 181 days ago  ETOH use, alcohol level <10, drinks liquor every day, unclear amount; concerned that he may be hiding how much he truly drinks from her per previous discussion  advised to drink no more than 1-2 drink(s) a day  Obesity, Body mass index is 30.27 kg/m., BMI >/= 30 associated with increased stroke risk, recommend weight loss, diet and exercise as appropriate   Hx stroke: MRI finding: chronic right basal ganglia and left corona radiata lacunar insults.  Other Active Problems  Hypothyroidism  CKD stage II  Hospital day # 4 Continue aspirin and Plavix.  Agree with vascular surgery plan for right carotid TCAR procedure tomorrow..  Discussed with patient, his wife,  and Dr. .  Greater than 50% time during the 25-minute visit was spent on counseling and coordination of care about symptomatic carotid stenosis and discussion about revascularization options stroke prevention and answering questions.  11/25/20, MD Medical Director [facility] Pager: [phone] 09/25/2020 1:26 PM   To contact Stroke Continuity provider, please refer to Natale Milch. After hours, contact General Neurology

## 2020-09-26 ENCOUNTER — Inpatient Hospital Stay (HOSPITAL_COMMUNITY): Payer: Medicare Other | Admitting: Anesthesiology

## 2020-09-26 ENCOUNTER — Encounter (HOSPITAL_COMMUNITY): Payer: Self-pay | Admitting: Internal Medicine

## 2020-09-26 ENCOUNTER — Inpatient Hospital Stay (HOSPITAL_COMMUNITY): Payer: Medicare Other

## 2020-09-26 ENCOUNTER — Encounter (HOSPITAL_COMMUNITY)
Admission: EM | Disposition: A | Discharge status: Home Health | Payer: Self-pay | Source: Home / Self Care | Attending: Internal Medicine

## 2020-09-26 DIAGNOSIS — N182 Chronic kidney disease, stage 2 (mild): Secondary | ICD-10-CM | POA: Diagnosis not present

## 2020-09-26 DIAGNOSIS — Z7289 Other problems related to lifestyle: Secondary | ICD-10-CM | POA: Diagnosis not present

## 2020-09-26 DIAGNOSIS — G9340 Encephalopathy, unspecified: Secondary | ICD-10-CM | POA: Diagnosis not present

## 2020-09-26 HISTORY — PX: TRANSCAROTID ARTERY REVASCULARIZATIONÂ: SHX6778

## 2020-09-26 LAB — COMPREHENSIVE METABOLIC PANEL
ALT: 20 U/L (ref 0–44)
AST: 21 U/L (ref 15–41)
Albumin: 3.1 g/dL — ABNORMAL LOW (ref 3.5–5.0)
Alkaline Phosphatase: 53 U/L (ref 38–126)
Anion gap: 12 (ref 5–15)
BUN: 14 mg/dL (ref 8–23)
CO2: 22 mmol/L (ref 22–32)
Calcium: 8.6 mg/dL — ABNORMAL LOW (ref 8.9–10.3)
Chloride: 104 mmol/L (ref 98–111)
Creatinine, Ser: 0.98 mg/dL (ref 0.61–1.24)
GFR, Estimated: >60 mL/min (ref >60)
Glucose, Bld: 101 mg/dL — ABNORMAL HIGH (ref 70–99)
Potassium: 3.7 mmol/L (ref 3.5–5.1)
Sodium: 138 mmol/L (ref 135–145)
Total Bilirubin: 1 mg/dL (ref 0.3–1.2)
Total Protein: 6 g/dL — ABNORMAL LOW (ref 6.5–8.1)

## 2020-09-26 LAB — TYPE AND SCREEN
ABO/RH(D): B POS
Antibody Screen: NEGATIVE

## 2020-09-26 LAB — GLUCOSE, CAPILLARY
Glucose-Capillary: 108 mg/dL — ABNORMAL HIGH (ref 70–99)
Glucose-Capillary: 103 mg/dL — ABNORMAL HIGH (ref 70–99)
Glucose-Capillary: 123 mg/dL — ABNORMAL HIGH (ref 70–99)
Glucose-Capillary: 99 mg/dL (ref 70–99)
Glucose-Capillary: 146 mg/dL — ABNORMAL HIGH (ref 70–99)

## 2020-09-26 LAB — CBC
HCT: 39.4 % (ref 39.0–52.0)
Hemoglobin: 13.1 g/dL (ref 13.0–17.0)
MCH: 32.4 pg (ref 26.0–34.0)
MCHC: 33.2 g/dL (ref 30.0–36.0)
MCV: 97.5 fL (ref 80.0–100.0)
Platelets: 261 10*3/uL (ref 150–400)
RBC: 4.04 MIL/uL — ABNORMAL LOW (ref 4.22–5.81)
RDW: 13.7 % (ref 11.5–15.5)
WBC: 11.3 10*3/uL — ABNORMAL HIGH (ref 4.0–10.5)
nRBC: 0 % (ref 0.0–0.2)

## 2020-09-26 LAB — CULTURE, BLOOD (SINGLE): Culture: NO GROWTH

## 2020-09-26 LAB — ABO/RH: ABO/RH(D): B POS

## 2020-09-26 LAB — POCT ACTIVATED CLOTTING TIME
Activated Clotting Time: 142 seconds
Activated Clotting Time: 291 seconds

## 2020-09-26 SURGERY — TRANSCAROTID ARTERY REVASCULARIZATION (TCAR)
Anesthesia: General | Laterality: Right

## 2020-09-26 MED ORDER — FENTANYL CITRATE (PF) 250 MCG/5ML IJ SOLN
INTRAMUSCULAR | Status: AC
  Filled 2020-09-26: qty 5

## 2020-09-26 MED ORDER — 0.9 % SODIUM CHLORIDE (POUR BTL) OPTIME
TOPICAL | Status: DC | PRN
  Administered 2020-09-26: 1000 mL

## 2020-09-26 MED ORDER — PHENOL 1.4 % MT LIQD: 1.0000 | OROMUCOSAL | Status: DC | PRN

## 2020-09-26 MED ORDER — HEPARIN SODIUM (PORCINE) 1000 UNIT/ML IJ SOLN
INTRAMUSCULAR | Status: AC
  Filled 2020-09-26: qty 3

## 2020-09-26 MED ORDER — HEMOSTATIC AGENTS (NO CHARGE) OPTIME
TOPICAL | Status: DC | PRN
  Administered 2020-09-26: 1 via TOPICAL

## 2020-09-26 MED ORDER — ENOXAPARIN SODIUM 40 MG/0.4ML ~~LOC~~ SOLN: 40.0000 mg | SUBCUTANEOUS | Status: DC

## 2020-09-26 MED ORDER — SODIUM CHLORIDE 0.9 % IV SOLN: 500.0000 mL | Freq: Once | INTRAVENOUS | Status: DC | PRN

## 2020-09-26 MED ORDER — CEFAZOLIN SODIUM-DEXTROSE 2-4 GM/100ML-% IV SOLN
2.0000 g | Freq: Three times a day (TID) | INTRAVENOUS | Status: AC
  Administered 2020-09-26 – 2020-09-27 (×2): 2 g via INTRAVENOUS
  Filled 2020-09-26 (×2): qty 100

## 2020-09-26 MED ORDER — ONDANSETRON HCL 4 MG/2ML IJ SOLN
INTRAMUSCULAR | Status: AC
  Filled 2020-09-26: qty 2

## 2020-09-26 MED ORDER — METOPROLOL TARTRATE 5 MG/5ML IV SOLN: 2.0000 mg | INTRAVENOUS | Status: DC | PRN

## 2020-09-26 MED ORDER — MORPHINE SULFATE (PF) 2 MG/ML IV SOLN: 2.0000 mg | INTRAVENOUS | Status: DC | PRN

## 2020-09-26 MED ORDER — LIDOCAINE 2% (20 MG/ML) 5 ML SYRINGE
INTRAMUSCULAR | Status: DC | PRN
  Administered 2020-09-26: 30 mg via INTRAVENOUS

## 2020-09-26 MED ORDER — HYDRALAZINE HCL 20 MG/ML IJ SOLN: 5.0000 mg | INTRAMUSCULAR | Status: DC | PRN

## 2020-09-26 MED ORDER — ROCURONIUM BROMIDE 10 MG/ML (PF) SYRINGE
PREFILLED_SYRINGE | INTRAVENOUS | Status: AC
  Filled 2020-09-26: qty 10

## 2020-09-26 MED ORDER — PHENYLEPHRINE 40 MCG/ML (10ML) SYRINGE FOR IV PUSH (FOR BLOOD PRESSURE SUPPORT)
PREFILLED_SYRINGE | INTRAVENOUS | Status: AC
  Filled 2020-09-26: qty 10

## 2020-09-26 MED ORDER — LIDOCAINE 2% (20 MG/ML) 5 ML SYRINGE
INTRAMUSCULAR | Status: AC
  Filled 2020-09-26: qty 5

## 2020-09-26 MED ORDER — GLYCOPYRROLATE 0.2 MG/ML IJ SOLN
INTRAMUSCULAR | Status: DC | PRN
  Administered 2020-09-26: .2 mg via INTRAVENOUS

## 2020-09-26 MED ORDER — FENTANYL CITRATE (PF) 100 MCG/2ML IJ SOLN
INTRAMUSCULAR | Status: DC | PRN
  Administered 2020-09-26 (×2): 50 ug via INTRAVENOUS

## 2020-09-26 MED ORDER — DEXAMETHASONE SODIUM PHOSPHATE 10 MG/ML IJ SOLN
INTRAMUSCULAR | Status: AC
  Filled 2020-09-26: qty 1

## 2020-09-26 MED ORDER — PROPOFOL 10 MG/ML IV BOLUS
INTRAVENOUS | Status: DC | PRN
  Administered 2020-09-26: 120 mg via INTRAVENOUS

## 2020-09-26 MED ORDER — DEXMEDETOMIDINE HCL IN NACL 200 MCG/50ML IV SOLN
INTRAVENOUS | Status: DC | PRN
  Administered 2020-09-26: 20 ug via INTRAVENOUS

## 2020-09-26 MED ORDER — PROPOFOL 10 MG/ML IV BOLUS
INTRAVENOUS | Status: AC
  Filled 2020-09-26: qty 20

## 2020-09-26 MED ORDER — LORAZEPAM 2 MG/ML IJ SOLN
1.0000 mg | Freq: Once | INTRAMUSCULAR | Status: AC
  Administered 2020-09-26: 1 mg via INTRAVENOUS

## 2020-09-26 MED ORDER — PANTOPRAZOLE SODIUM 40 MG PO TBEC
40.0000 mg | DELAYED_RELEASE_TABLET | Freq: Every day | ORAL | Status: DC
  Administered 2020-09-27: 40 mg via ORAL
  Filled 2020-09-26: qty 1

## 2020-09-26 MED ORDER — ESMOLOL HCL 100 MG/10ML IV SOLN
INTRAVENOUS | Status: AC
  Filled 2020-09-26: qty 10

## 2020-09-26 MED ORDER — LACTATED RINGERS IV SOLN: INTRAVENOUS | Status: DC | PRN

## 2020-09-26 MED ORDER — SODIUM CHLORIDE 0.9 % IV SOLN
INTRAVENOUS | Status: AC
  Filled 2020-09-26: qty 1.2

## 2020-09-26 MED ORDER — SUCCINYLCHOLINE CHLORIDE 200 MG/10ML IV SOSY
PREFILLED_SYRINGE | INTRAVENOUS | Status: AC
  Filled 2020-09-26: qty 10

## 2020-09-26 MED ORDER — METOPROLOL TARTRATE 5 MG/5ML IV SOLN
INTRAVENOUS | Status: DC | PRN
  Administered 2020-09-26: 2 mg via INTRAVENOUS

## 2020-09-26 MED ORDER — PROTAMINE SULFATE 10 MG/ML IV SOLN
INTRAVENOUS | Status: AC
  Filled 2020-09-26: qty 5

## 2020-09-26 MED ORDER — IODIXANOL 320 MG/ML IV SOLN
INTRAVENOUS | Status: DC | PRN
  Administered 2020-09-26: 25 mL via INTRA_ARTERIAL

## 2020-09-26 MED ORDER — ONDANSETRON HCL 4 MG/2ML IJ SOLN
INTRAMUSCULAR | Status: DC | PRN
  Administered 2020-09-26: 4 mg via INTRAVENOUS

## 2020-09-26 MED ORDER — PROTAMINE SULFATE 10 MG/ML IV SOLN
INTRAVENOUS | Status: DC | PRN
  Administered 2020-09-26 (×3): 10 mg via INTRAVENOUS

## 2020-09-26 MED ORDER — EPHEDRINE 5 MG/ML INJ
INTRAVENOUS | Status: AC
  Filled 2020-09-26: qty 10

## 2020-09-26 MED ORDER — FENTANYL CITRATE (PF) 100 MCG/2ML IJ SOLN
INTRAMUSCULAR | Status: AC
  Filled 2020-09-26: qty 2

## 2020-09-26 MED ORDER — ROCURONIUM BROMIDE 10 MG/ML (PF) SYRINGE
PREFILLED_SYRINGE | INTRAVENOUS | Status: AC
  Filled 2020-09-26: qty 20

## 2020-09-26 MED ORDER — DEXMEDETOMIDINE (PRECEDEX) IN NS 20 MCG/5ML (4 MCG/ML) IV SYRINGE
PREFILLED_SYRINGE | INTRAVENOUS | Status: AC
  Filled 2020-09-26: qty 10

## 2020-09-26 MED ORDER — METOPROLOL TARTRATE 5 MG/5ML IV SOLN
INTRAVENOUS | Status: AC
  Filled 2020-09-26: qty 5

## 2020-09-26 MED ORDER — SUGAMMADEX SODIUM 200 MG/2ML IV SOLN
INTRAVENOUS | Status: DC | PRN
  Administered 2020-09-26: 200 mg via INTRAVENOUS

## 2020-09-26 MED ORDER — PHENYLEPHRINE HCL-NACL 10-0.9 MG/250ML-% IV SOLN
INTRAVENOUS | Status: DC | PRN
  Administered 2020-09-26: 75 ug/min via INTRAVENOUS

## 2020-09-26 MED ORDER — OXYCODONE-ACETAMINOPHEN 5-325 MG PO TABS: 1.0000 | ORAL_TABLET | ORAL | Status: DC | PRN

## 2020-09-26 MED ORDER — OXYCODONE HCL 5 MG/5ML PO SOLN
5.0000 mg | Freq: Once | ORAL | Status: DC | PRN
Start: 2020-09-26 — End: 2020-09-26

## 2020-09-26 MED ORDER — SODIUM CHLORIDE 0.9 % IV SOLN
INTRAVENOUS | Status: DC | PRN
  Administered 2020-09-26: 500 mL

## 2020-09-26 MED ORDER — FENTANYL CITRATE (PF) 100 MCG/2ML IJ SOLN
25.0000 ug | INTRAMUSCULAR | Status: DC | PRN
  Administered 2020-09-26: 25 ug via INTRAVENOUS

## 2020-09-26 MED ORDER — ONDANSETRON HCL 4 MG/2ML IJ SOLN
INTRAMUSCULAR | Status: AC
  Filled 2020-09-26: qty 4

## 2020-09-26 MED ORDER — PHENYLEPHRINE 40 MCG/ML (10ML) SYRINGE FOR IV PUSH (FOR BLOOD PRESSURE SUPPORT)
PREFILLED_SYRINGE | INTRAVENOUS | Status: AC
  Filled 2020-09-26: qty 20

## 2020-09-26 MED ORDER — HEPARIN SODIUM (PORCINE) 1000 UNIT/ML IJ SOLN
INTRAMUSCULAR | Status: DC | PRN
  Administered 2020-09-26 (×2): 10000 [IU] via INTRAVENOUS

## 2020-09-26 MED ORDER — CHLORHEXIDINE GLUCONATE 0.12 % MT SOLN
15.0000 mL | OROMUCOSAL | Status: AC
  Administered 2020-09-26: 15 mL via OROMUCOSAL
  Filled 2020-09-26 (×2): qty 15

## 2020-09-26 MED ORDER — LABETALOL HCL 5 MG/ML IV SOLN: 10.0000 mg | INTRAVENOUS | Status: DC | PRN

## 2020-09-26 MED ORDER — LACTATED RINGERS IV SOLN: INTRAVENOUS | Status: DC

## 2020-09-26 MED ORDER — LORAZEPAM 2 MG/ML IJ SOLN
INTRAMUSCULAR | Status: AC
  Administered 2020-09-26: 1 mg
  Filled 2020-09-26: qty 1

## 2020-09-26 MED ORDER — ATROPINE SULFATE 0.4 MG/ML IJ SOLN
INTRAMUSCULAR | Status: DC | PRN
  Administered 2020-09-26: .4 mg via INTRAVENOUS

## 2020-09-26 MED ORDER — ROCURONIUM BROMIDE 100 MG/10ML IV SOLN
INTRAVENOUS | Status: DC | PRN
  Administered 2020-09-26: 60 mg via INTRAVENOUS

## 2020-09-26 MED ORDER — DEXMEDETOMIDINE (PRECEDEX) IN NS 20 MCG/5ML (4 MCG/ML) IV SYRINGE
PREFILLED_SYRINGE | INTRAVENOUS | Status: AC
  Filled 2020-09-26: qty 5

## 2020-09-26 MED ORDER — ONDANSETRON HCL 4 MG/2ML IJ SOLN: 4.0000 mg | Freq: Once | INTRAMUSCULAR | Status: DC | PRN

## 2020-09-26 MED ORDER — OXYCODONE HCL 5 MG PO TABS
5.0000 mg | ORAL_TABLET | Freq: Once | ORAL | Status: DC | PRN
Start: 2020-09-26 — End: 2020-09-26

## 2020-09-26 MED ORDER — SUGAMMADEX SODIUM 500 MG/5ML IV SOLN
INTRAVENOUS | Status: AC
  Filled 2020-09-26: qty 5

## 2020-09-26 MED ORDER — SODIUM CHLORIDE 0.9 % IV SOLN: INTRAVENOUS | Status: DC

## 2020-09-26 SURGICAL SUPPLY — 67 items
ADH SKN CLS APL DERMABOND .7 (GAUZE/BANDAGES/DRESSINGS) ×1
ADH SKN CLS LQ APL DERMABOND (GAUZE/BANDAGES/DRESSINGS) ×1
BAG BANDED W/RUBBER/TAPE 36X54 (MISCELLANEOUS) ×2 IMPLANT
BAG EQP BAND 135X91 W/RBR TAPE (MISCELLANEOUS) ×1
BALLN STERLING RX 4X30X80 (BALLOONS) ×2
BALLOON STERLING RX 4X30X80 (BALLOONS) IMPLANT
CANISTER SUCT 3000ML PPV (MISCELLANEOUS) ×2 IMPLANT
CATH BEACON 5 .035 40 KMP TP (CATHETERS) IMPLANT
CATH BEACON 5 .038 40 KMP TP (CATHETERS) ×2
CATH ROBINSON RED A/P 18FR (CATHETERS) IMPLANT
CLIP VESOCCLUDE MED 6/CT (CLIP) ×2 IMPLANT
CLIP VESOCCLUDE SM WIDE 6/CT (CLIP) ×2 IMPLANT
COVER DOME SNAP 22 D (MISCELLANEOUS) ×2 IMPLANT
COVER PROBE W GEL 5X96 (DRAPES) ×2 IMPLANT
COVER WAND RF STERILE (DRAPES) ×2 IMPLANT
DERMABOND ADHESIVE PROPEN (GAUZE/BANDAGES/DRESSINGS) ×1
DERMABOND ADVANCED (GAUZE/BANDAGES/DRESSINGS) ×1
DERMABOND ADVANCED .7 DNX12 (GAUZE/BANDAGES/DRESSINGS) ×1 IMPLANT
DERMABOND ADVANCED .7 DNX6 (GAUZE/BANDAGES/DRESSINGS) IMPLANT
DRAPE FEMORAL ANGIO 80X135IN (DRAPES) ×2 IMPLANT
ELECT REM PT RETURN 9FT ADLT (ELECTROSURGICAL) ×2
ELECTRODE REM PT RTRN 9FT ADLT (ELECTROSURGICAL) ×1 IMPLANT
GLOVE BIO SURGEON STRL SZ7.5 (GLOVE) ×2 IMPLANT
GOWN STRL REUS W/ TWL LRG LVL3 (GOWN DISPOSABLE) ×2 IMPLANT
GOWN STRL REUS W/ TWL XL LVL3 (GOWN DISPOSABLE) ×1 IMPLANT
GOWN STRL REUS W/TWL LRG LVL3 (GOWN DISPOSABLE) ×4
GOWN STRL REUS W/TWL XL LVL3 (GOWN DISPOSABLE) ×2
GUIDEWIRE ENROUTE 0.014 (WIRE) ×3 IMPLANT
HEMOSTAT SNOW SURGICEL 2X4 (HEMOSTASIS) ×1 IMPLANT
INSERT FOGARTY SM (MISCELLANEOUS) IMPLANT
INTRODUCER KIT GALT 7CM (INTRODUCER) ×2
KIT BASIN OR (CUSTOM PROCEDURE TRAY) ×2 IMPLANT
KIT ENCORE 26 ADVANTAGE (KITS) ×2 IMPLANT
KIT INTRODUCER GALT 7 (INTRODUCER) ×1 IMPLANT
KIT TURNOVER KIT B (KITS) ×2 IMPLANT
LOOP VESSEL MAXI BLUE (MISCELLANEOUS) ×1 IMPLANT
NDL HYPO 25GX1X1/2 BEV (NEEDLE) IMPLANT
NEEDLE HYPO 25GX1X1/2 BEV (NEEDLE) IMPLANT
PACK CAROTID (CUSTOM PROCEDURE TRAY) ×2 IMPLANT
POSITIONER HEAD DONUT 9IN (MISCELLANEOUS) ×2 IMPLANT
PROTECTION STATION PRESSURIZED (MISCELLANEOUS)
SET MICROPUNCTURE 5F STIFF (MISCELLANEOUS) ×1 IMPLANT
SHEATH AVANTI 11CM 5FR (SHEATH) IMPLANT
SHUNT CAROTID BYPASS 10 (VASCULAR PRODUCTS) IMPLANT
SHUNT CAROTID BYPASS 12FRX15.5 (VASCULAR PRODUCTS) IMPLANT
STATION PROTECTION PRESSURIZED (MISCELLANEOUS) IMPLANT
STENT TRANSCAROTID SYSTEM 8X40 (Permanent Stent) ×1 IMPLANT
SUT MNCRL AB 4-0 PS2 18 (SUTURE) ×2 IMPLANT
SUT PROLENE 5 0 C 1 24 (SUTURE) ×2 IMPLANT
SUT PROLENE 6 0 BV (SUTURE) IMPLANT
SUT PROLENE 7 0 BV 1 (SUTURE) IMPLANT
SUT SILK 2 0 PERMA HAND 18 BK (SUTURE) ×3 IMPLANT
SUT SILK 2 0 SH CR/8 (SUTURE) ×1 IMPLANT
SUT SILK 3 0 (SUTURE)
SUT SILK 3-0 18XBRD TIE 12 (SUTURE) IMPLANT
SUT VIC AB 3-0 SH 27 (SUTURE) ×2
SUT VIC AB 3-0 SH 27X BRD (SUTURE) ×1 IMPLANT
SYR 10ML LL (SYRINGE) ×6 IMPLANT
SYR 20ML LL LF (SYRINGE) ×2 IMPLANT
SYR CONTROL 10ML LL (SYRINGE) IMPLANT
SYSTEM TRANSCAROTID NEUROPRTCT (MISCELLANEOUS) ×1 IMPLANT
TOWEL GREEN STERILE (TOWEL DISPOSABLE) ×2 IMPLANT
TRANSCAROTID NEUROPROTECT SYS (MISCELLANEOUS) ×2
TUBING ART PRESS 48 MALE/FEM (TUBING) IMPLANT
TUBING EXTENTION W/L.L. (IV SETS) IMPLANT
WATER STERILE IRR 1000ML POUR (IV SOLUTION) ×2 IMPLANT
WIRE BENTSON .035X145CM (WIRE) ×2 IMPLANT

## 2020-09-26 NOTE — Progress Notes (Signed)
Patient resting quietly at this time with eyes closed Dr.Cain at bedside at 1830 patient arouses when name called and curses at staff

## 2020-09-26 NOTE — Progress Notes (Signed)
Patient continues to be agitated continues to fight with staff and curse at staff CRNA at bedside made anesthesiologist aware

## 2020-09-26 NOTE — Anesthesia Procedure Notes (Signed)
Procedure Name: Intubation Date/Time: 09/26/2020 4:01 PM Performed by: Gwenyth Allegra, CRNA Pre-anesthesia Checklist: Patient identified, Emergency Drugs available, Suction available, Patient being monitored and Timeout performed Patient Re-evaluated:Patient Re-evaluated prior to induction Oxygen Delivery Method: Circle system utilized Preoxygenation: Pre-oxygenation with 100% oxygen Induction Type: IV induction Ventilation: Mask ventilation without difficulty and Oral airway inserted - appropriate to patient size Grade View: Grade I Tube type: Oral Tube size: 7.5 mm Number of attempts: 1 Airway Equipment and Method: Stylet Placement Confirmation: ETT inserted through vocal cords under direct vision,  positive ETCO2 and breath sounds checked- equal and bilateral Secured at: 22 cm Tube secured with: Tape Dental Injury: Teeth and Oropharynx as per pre-operative assessment

## 2020-09-26 NOTE — Progress Notes (Signed)
  Report called to [facility] in Short Stay.  Transported to surgery in the bed.

## 2020-09-26 NOTE — Progress Notes (Signed)
Patient received to PACU in a agitated state grabbing and cursing at staff  CRNA at bedside

## 2020-09-26 NOTE — Progress Notes (Signed)
  Progress Note    09/26/2020 3:03 PM Day of Surgery  Subjective: No overnight issues  Vitals:   09/26/20 0756 09/26/20 1229  BP: (!) 161/85 (!) 151/90  Pulse: 84 81  Resp: 18 18  Temp: 97.9 F (36.6 C) 98.1 F (36.7 C)  SpO2: 94%     Physical Exam: Awake alert oriented no labored respirations moving all extremities without limitation  CBC    Component Value Date/Time   WBC 11.3 (H) 09/26/2020 0330   RBC 4.04 (L) 09/26/2020 0330   HGB 13.1 09/26/2020 0330   HCT 39.4 09/26/2020 0330   PLT 261 09/26/2020 0330   MCV 97.5 09/26/2020 0330   MCH 32.4 09/26/2020 0330   MCHC 33.2 09/26/2020 0330   RDW 13.7 09/26/2020 0330   LYMPHSABS 1.9 09/23/2020 0528   MONOABS 2.1 (H) 09/23/2020 0528   EOSABS 0.3 09/23/2020 0528   BASOSABS 0.1 09/23/2020 0528    BMET    Component Value Date/Time   NA 138 09/26/2020 0330   K 3.7 09/26/2020 0330   CL 104 09/26/2020 0330   CO2 22 09/26/2020 0330   GLUCOSE 101 (H) 09/26/2020 0330   BUN 14 09/26/2020 0330   CREATININE 0.98 09/26/2020 0330   CALCIUM 8.6 (L) 09/26/2020 0330   GFRNONAA >60 09/26/2020 0330   GFRAA 66 (L) 11/28/2013 1238    INR    Component Value Date/Time   INR 1.3 (H) 09/20/2020 2325     Intake/Output Summary (Last 24 hours) at 09/26/2020 1503 Last data filed at 09/26/2020 0440 Gross per 24 hour  Intake --  Output 650 ml  Net -650 ml     Assessment:  80 y.o. male is admitted with right brain stroke with resolving left upper extremity weakness.  He is on aspirin and Plavix.  Plan: Right transcarotid artery stenting today in the OR.  Again discussed risk,benefits and alternatives and he demonstrates good understanding.   Rhetta Cleek C. Randie Heinz, MD [facility] Office: [phone] Pager: [phone]  09/26/2020 3:03 PM

## 2020-09-26 NOTE — Discharge Instructions (Signed)
   [facility]  Discharge Instructions   Carotid Surgery  Please refer to the following instructions for your post-procedure care. Your surgeon or physician assistant will discuss any changes with you.  Activity  You are encouraged to walk as much as you can. You can slowly return to normal activities but must avoid strenuous activity and heavy lifting until your doctor tell you it's okay. Avoid activities such as vacuuming or swinging a golf club. You can drive after one week if you are comfortable and you are no longer taking prescription pain medications. It is normal to feel tired for serval weeks after your surgery. It is also normal to have difficulty with sleep habits, eating, and bowel movements after surgery. These will go away with time.  Bathing/Showering  Shower daily after you go home. Do not soak in a bathtub, hot tub, or swim until the incision heals completely.  Incision Care  Shower every day. Clean your incision with mild soap and water. Pat the area dry with a clean towel. You do not need a bandage unless otherwise instructed. Do not apply any ointments or creams to your incision. You may have skin glue on your incision. Do not peel it off. It will come off on its own in about one week. Your incision may feel thickened and raised for several weeks after your surgery. This is normal and the skin will soften over time.   For Men Only: It's okay to shave around the incision but do not shave the incision itself for 2 weeks. It is common to have numbness under your chin that could last for several months.  Diet  Resume your normal diet. There are no special food restrictions following this procedure. A low fat/low cholesterol diet is recommended for all patients with vascular disease. In order to heal from your surgery, it is CRITICAL to get adequate nutrition. Your body requires vitamins, minerals, and protein. Vegetables are the best source of  vitamins and minerals. Vegetables also provide the perfect balance of protein. Processed food has little nutritional value, so try to avoid this.  Medications  Resume taking all of your medications unless your doctor or physician assistant tells you not to. If your incision is causing pain, you may take over-the- counter pain relievers such as acetaminophen (Tylenol). If you were prescribed a stronger pain medication, please be aware these medications can cause nausea and constipation. Prevent nausea by taking the medication with a snack or meal. Avoid constipation by drinking plenty of fluids and eating foods with a high amount of fiber, such as fruits, vegetables, and grains.   Do not take Tylenol if you are taking prescription pain medications.  Follow Up  Our office will schedule a follow up appointment 2-3 weeks following discharge.  Please call us immediately for any of the following conditions  . Increased pain, redness, drainage (pus) from your incision site. . Fever of 101 degrees or higher. . If you should develop stroke (slurred speech, difficulty swallowing, weakness on one side of your body, loss of vision) you should call 911 and go to the nearest emergency room. .  Reduce your risk of vascular disease:  . Stop smoking. If you would like help call [facility] at 1-800-QUIT-NOW (1-800-784-8669) or [place] at 336-586-4000. . Manage your cholesterol . Maintain a desired weight . Control your diabetes . Keep your blood pressure down .  If you have any questions, please call the office at [phone]. 

## 2020-09-26 NOTE — Transfer of Care (Signed)
Immediate Anesthesia Transfer of Care Note  Patient: Vincent Campbell  Procedure(s) Performed: RIGHT TRANSCAROTID ARTERY REVASCULARIZATION (Right )  Patient Location: PACU  Anesthesia Type:General  Level of Consciousness: awake and pateint uncooperative  Airway & Oxygen Therapy: Patient Spontanous Breathing and Patient connected to nasal cannula oxygen  Post-op Assessment: Report given to RN and Post -op Vital signs reviewed and stable  Post vital signs: Reviewed and stable  Last Vitals:  Vitals Value Taken Time  BP 129/99 09/26/20 1759  Temp    Pulse 114 09/26/20 1800  Resp 19 09/26/20 1800  SpO2 98 % 09/26/20 1800  Vitals shown include unvalidated device data.  Last Pain:  Vitals:   09/26/20 1229  TempSrc: Oral  PainSc:          Complications: No complications documented.

## 2020-09-26 NOTE — Progress Notes (Signed)
Notified on-call physician regarding CIWA protocol without ativan order, no new orders at this time will continue to monitor.

## 2020-09-26 NOTE — Plan of Care (Signed)

## 2020-09-26 NOTE — Progress Notes (Signed)
Wife at bedside spoke with her regarding patient status was thankful fir update

## 2020-09-26 NOTE — Progress Notes (Signed)
STROKE TEAM PROGRESS NOTE   INTERVAL HISTORY Patient is sitting up in bed.  His wife is at the bedside.  States is doing well.  He has no new complaints.  Vascular surgery plan elective right carotid revascularization with TCAR procedure today afternoon. Vitals:   09/25/20 2312 09/26/20 0341 09/26/20 0756 09/26/20 1229  BP: (!) 157/90 (!) 144/90 (!) 161/85 (!) (P) 151/90  Pulse: 77 83 84 (P) 81  Resp: 18 19 18  (P) 18  Temp: 97.7 F (36.5 C) 97.6 F (36.4 C) 97.9 F (36.6 C) (P) 98.1 F (36.7 C)  TempSrc: Oral Oral Oral (P) Oral  SpO2: 95% 95% 94%   Weight:      Height:       CBC:  Recent Labs  Lab 09/22/20 0459 09/23/20 0528 09/23/20 1654 09/25/20 0355 09/26/20 0330  WBC 12.8* 13.4*   < > 11.7* 11.3*  NEUTROABS 8.2* 8.9*  --   --   --   HGB 13.1 13.7   < > 13.3 13.1  HCT 39.4 40.6   < > 39.9 39.4  MCV 99.7 98.1   < > 97.3 97.5  PLT 207 231   < > 263 261   < > = values in this interval not displayed.   Basic Metabolic Panel:  Recent Labs  Lab 09/22/20 0459 09/23/20 0528 09/25/20 0355 09/26/20 0330  NA 138 136 139 138  K 3.4* 3.9 3.6 3.7  CL 104 103 104 104  CO2 23 23 26 22   GLUCOSE 107* 122* 111* 101*  BUN 12 11 11 14   CREATININE 0.90 0.84 0.93 0.98  CALCIUM 8.3* 8.2* 8.5* 8.6*  MG 2.1 2.0  --   --   PHOS 2.7 2.4*  --   --    Lipid Panel:  Recent Labs  Lab 09/23/20 1654  CHOL 134  TRIG 71  HDL 56  CHOLHDL 2.4  VLDL 14  LDLCALC 64   HgbA1c:  Recent Labs  Lab 09/23/20 1654  HGBA1C 7.0*   Urine Drug Screen: No results for input(s): LABOPIA, COCAINSCRNUR, LABBENZ, AMPHETMU, THCU, LABBARB in the last 168 hours.  Alcohol Level  Recent Labs  Lab 09/21/20 0440  ETH <10    IMAGING past 24 hours CT ANGIO HEAD W OR WO CONTRAST  Result Date: 09/23/2020  IMPRESSION: Head CT: Multifocal small right cerebrum infarcts are better demonstrated on same day MRI. Chronic right basal ganglia and left corona radiata lacunar insults. CTA neck: At least 90%  proximal right ICA narrowing. Less than 50% proximal left ICA narrowing. CTA head: Mild to moderate right V4 segment narrowing. Mild right supraclinoid ICA and left PCA narrowing. These results will be called to the ordering clinician or representative by the Radiologist Assistant, and communication documented in the PACS or 09/25/20. Electronically Signed   By: 09/23/20 M.D.   On: 09/23/2020 19:55   CT ANGIO NECK W OR WO CONTRAST  Result Date: 09/23/2020  IMPRESSION: Head CT: Multifocal small right cerebrum infarcts are better demonstrated on same day MRI. Chronic right basal ganglia and left corona radiata lacunar insults. CTA neck: At least 90% proximal right ICA narrowing. Less than 50% proximal left ICA narrowing. CTA head: Mild to moderate right V4 segment narrowing. Mild right supraclinoid ICA and left PCA narrowing.  MR BRAIN WO CONTRAST  Result Date: 09/23/2020  IMPRESSION: Scattered small subacute infarcts involving the right cerebrum, right basal ganglia and anterior corpus callosum. Consider CTA head and neck. Chronic right basal  ganglia and left corona radiata lacunar insults. Mild chronic microvascular ischemic changes.  DG CHEST PORT 1 VIEW  Result Date: 09/23/2020 IMPRESSION: 1. Low lung volumes without radiographic evidence of acute cardiopulmonary disease.   VAS US CAROTID  Result Date: 09/24/2020 Summary: Right Carotid: Velocities in the right ICA are consistent with a 80-99%                stenosis. Left Carotid: Velocities in the left ICA are consistent with a 1-39% stenosis. Vertebrals:  Bilateral vertebral arteries demonstrate antegrade flow. Subclavians: Normal flow hemodynamics were seen in bilateral subclavian              arteries. *See table(s) above for measurements and observations.     Preliminary    Result Date 09/21/2020 CT Cspine 1. Moderate to marked severity degenerative changes at the levels of C5-C6 and C6-C7. 2. No evidence of an acute  fracture or subluxation.   PHYSICAL EXAM Physical Exam  Constitutional: Appears well-developed and well-nourished.  Psych: Affect appropriate to situation. Cooperative with examination.  Eyes: Normal conjunctiva HENT: No OP obstruction, hearing aid in place right ear, hard of hearing at baseline Head: Normocephalic and atraumatic without obvious deformity Cardiovascular: Extremities warm, well perfused. 2+ edema in bilateral lower extremities.  Respiratory: Effort normal, non-labored breathing.  GI: Soft.  Rounded. There is no tenderness.  Skin: WDI  Neuro: Mental Status: Patient is awake, alert, oriented to person, and situation. He is able to state name correctly. Patient is able to provide some details of current history with some confusion noted. He stated that he has had problems with function in his left hand, arm, and leg. Follows commands with some mimic of examiner due to patient hard of hearing.  No signs of aphasia or neglect noted. Cranial Nerves: II: Visual Fields are full. Pupils are equal, round, and reactive to light 2 mm/brisk III,IV, VI: EOMI without ptosis. V: Facial sensation is symmetric to light touch.  VII: Face is symmetric resting and smiling. Subtle palmomental reflex noted.  VIII: patient is hard of hearing no hearing aid in place in the right ear X: Phonation normal.  XI: Shoulder shrug is symmetric. XII: Tongue protrudes midline. Motor: Tone is normal. Bulk is normal. 5/5 strength in right upper and lower extremities and left lower extremity. Left upper extremity with 4+/5 strength. No pronator drift noted.  Sensory: Sensation is symmetric to light touch in the arms and legs. Cerebellar: FNF diminished  ASSESSMENT/PLAN Mr. Dainel Arcidiacono Sliney is a 80 y.o. male with history of hypertension, hyperlipidemia, hypothyroidism, mild renal insufficiency, and type 2 diabetes who presented to [facility] on 2/25 for evaluation of lethargy and confusion.   At home he was  noted to be more confused and forgetful.  He was taken to the ED.  ED work-up reveled oxygen saturation in the low 90s on room air, chest x-ray with bibasilar opacities, a creatinine of 1.23 but with a leukocytosis with WBC of 15,900. He was initiated on antibiotics and admitted for further work-up.  An MRI was obtained 2/27 for further evaluation of confusion with evidence of scattered small subacute infarcts involving the right cerebrum, right basal ganglia, and anterior corpus callosum with chronic right basal ganglia and left corona radiata lacunar infarcts.  Stroke: Scattered small subacute infarcts involving the right cerebrum, right basal ganglia and anterior corpus callosum likely due to right carotid stenosis.  CT head: Generalized cerebral atrophy.  No acute intracranial abnormality.    CTA head Mild  to moderate right V4 segment narrowing.  Mild right supraclinoid ICA and left PCA narrowing  CTA neck At least 90% proximal right ICA narrowing. Less than 50% proximal  left ICA narrowing.  CT perfusion not done   MRI Scattered small subacute infarcts involving the right cerebrum, right basal ganglia and anterior corpus callosum. Chronic right basal ganglia and left corona radiata lacunar insults.  Mild chronic microvascular ischemic changes  Carotid Doppler right ICA are consistent with a 80-99% stenosis. left ICA 1-39% stenosis  2D Echo EF 60-65%, Grade 1 diastolic dysfunction  LDL 64  UMPN3I 7.0  VTE prophylaxis - Lovenox 40mg  daily  aspirin 81 mg daily prior to admission, now on aspirin 81 mg daily and clopidogrel 75 mg daily for at least 3 months per vascular surgery  Therapy recommendations:  Home health PT, home health OT Supervision/Assistance - 24 hour   Disposition:  pending  Right Carotid artery stenosis  >80%  vasculary surgery consulted, appreciate recs  Aspirin and plavix for at least 3 months  Planning for right sided Transcarotid artery stent on  Wednesday  Hypertension  Home meds: HCTZ 12.5 daily, losartan 100mg  daily, propranolol 10mg  bid  Stable . Long-term BP goal normotensive  Hyperlipidemia  Home meds:  Pravastatin 40mg  daily, resumed in hospital  LDL 64, goal < 70  Continue statin at discharge  Diabetes type II Controlled  Home meds:  Metformin 1500mg  daily  HgbA1c 7.0, goal < 7.0  CBGs Recent Labs    09/26/20 0341 09/26/20 0801 09/26/20 1228  GLUCAP 108* 103* 123*      SSI  Other Stroke Risk Factors  Advanced Age >/= 31   Cigarette smoker, former, quit 181 days ago  ETOH use, alcohol level <10, drinks liquor every day, unclear amount; concerned that he may be hiding how much he truly drinks from her per previous discussion  advised to drink no more than 1-2 drink(s) a day  Obesity, Body mass index is 30.27 kg/m., BMI >/= 30 associated with increased stroke risk, recommend weight loss, diet and exercise as appropriate   Hx stroke: MRI finding: chronic right basal ganglia and left corona radiata lacunar insults.  Other Active Problems  Hypothyroidism  CKD stage II  Hospital day # 5 Continue aspirin and Plavix.  Await  right carotid TCAR procedure later today..  Discussed with patient, his wife, , MD Medical Director 11/26/20 Stroke Center Pager: [phone] 09/26/2020 1:43 PM   To contact Stroke Continuity provider, please refer to 76. After hours, contact General Neurology

## 2020-09-26 NOTE — Progress Notes (Signed)
Dr. Chaney Malling made aware of patient attempting to hit staff and kick staff new order for ativan 1mg  given

## 2020-09-26 NOTE — Progress Notes (Signed)
Soft mittens applied to right hand at this time

## 2020-09-26 NOTE — Progress Notes (Addendum)
PROGRESS NOTE        PATIENT DETAILS Name: Vincent Campbell Age: 80 y.o. Sex: male Date of Birth: June 25, 1941 Admit Date: 09/20/2020 Admitting Physician Merlene Laughter, DO HUD:JSHF, Len Blalock, MD  Brief Narrative: Patient is a 80 y.o. male with history of DM-2, HTN, hypothyroidism, CKD stage II, EtOH use presented with lethargy/confusion/difficulty ambulating-further evaluation revealed acute CVA.  Significant events: 2/24>> admit for lethargic/difficulty ambulating-work-up reviewed CVA  Significant studies: 2/25>> CT brain: No acute intracranial abnormality. 2/25>> CT C-spine: No acute fracture or subluxation 2/27>> MRI brain: Scattered infarcts-right cerebrum/right basal ganglia/anterior corpus callosum 2/28>> carotid Doppler: Right ICA-80-99% stenosis, left ICA 1-29% stenosis. 2/27>> CTA neck: 90% narrowing of right ICA, 50% proximal left ICA narrowing 2/27>> CTA head: Moderate right V4 segment narrowing 2/27>> LDL: 64 2/27>> A1c: 7.0 2/28>> Echo: EF 60-65%, grade 1 diastolic dysfunction  Antimicrobial therapy: None  Microbiology data: 2/24>> blood culture: No growth 2/25>> urine culture: No growth 2/27>> blood culture: No growth  Procedures : None  Consults: Neurology Vascular surgery  DVT Prophylaxis : enoxaparin (LOVENOX) injection 40 mg Start: 09/21/20 1000  Subjective: Denies any chest pain or shortness of breath-lying comfortably in bed.  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  Remains on antiplatelets/statin.   Right ICA stenosis: See above.  Acute metabolic encephalopathy/delirium superimposed on dementia: Maintain delirium precautions-currently awake/alert.  EtOH use: No signs of withdrawal-watch closely.  AKI on CKD stage II: AKI resolved-creatinine back to baseline  Hypothyroidism: TSH within  normal limits-unclear if he is still taking Synthroid-we will need to discuss with family.  HTN: BP controlled-all antihypertensives on hold  DM-2: CBGs stable-continue SSI.  Recent Labs    09/25/20 2310 09/26/20 0341 09/26/20 0801  GLUCAP 94 108* 103*    Obesity: Estimated body mass index is 30.27 kg/m as calculated from the following:   Height as of this encounter: 5\' 8"  (1.727 m).   Weight as of this encounter: 90.3 kg.    Diet: Diet Order            Diet NPO time specified Except for: Sips with Meds  Diet effective midnight                  Code Status: Full code   Family Communication: Spouse-Marilyn [phone]   Disposition Plan: Status is: Inpatient  Remains inpatient appropriate because:Inpatient level of care appropriate due to severity of illness   Dispo:  Patient From: Skilled Nursing Facility  Planned Disposition: Home with Health Care Svc  Medically stable for discharge: No     Barriers to Discharge: CVA due to right ICA stenosis- stenting on 3/2.  Antimicrobial agents: Anti-infectives (From admission, onward)   Start     Dose/Rate Route Frequency Ordered Stop   09/26/20 0600  cefUROXime (ZINACEF) 1.5 g in sodium chloride 0.9 % 100 mL IVPB        1.5 g 200 mL/hr over 30 Minutes Intravenous To Short Stay 09/25/20 0742 09/27/20 0600   09/22/20 0400  cefTRIAXone (ROCEPHIN) 2 g in sodium chloride 0.9 % 100 mL IVPB        2 g 200 mL/hr over 30 Minutes Intravenous Every 24 hours 09/21/20 0353 09/25/20 1628   09/22/20 0400  azithromycin (ZITHROMAX) 500 mg in sodium  chloride 0.9 % 250 mL IVPB        500 mg 250 mL/hr over 60 Minutes Intravenous Every 24 hours 09/21/20 0353 09/25/20 1628   09/21/20 0400  cefTRIAXone (ROCEPHIN) 1 g in sodium chloride 0.9 % 100 mL IVPB        1 g 200 mL/hr over 30 Minutes Intravenous  Once 09/21/20 0349 09/21/20 0717   09/21/20 0400  azithromycin (ZITHROMAX) 500 mg in sodium chloride 0.9 % 250 mL IVPB         500 mg 250 mL/hr over 60 Minutes Intravenous  Once 09/21/20 0349 09/21/20 0717       Time spent: 25 minutes-Greater than 50% of this time was spent in counseling, explanation of diagnosis, planning of further management, and coordination of care.  MEDICATIONS: Scheduled Meds: . aspirin EC  81 mg Oral Daily  . clopidogrel  75 mg Oral Daily  . enoxaparin (LOVENOX) injection  40 mg Subcutaneous Q24H  . folic acid  1 mg Oral Daily  . guaiFENesin  1,200 mg Oral BID  . insulin aspart  0-9 Units Subcutaneous Q4H  . multivitamin with minerals  1 tablet Oral Daily  . pravastatin  40 mg Oral Daily  . thiamine  100 mg Oral Daily   Or  . thiamine  100 mg Intravenous Daily   Continuous Infusions: . cefUROXime (ZINACEF)  IV     PRN Meds:.acetaminophen **OR** acetaminophen, ipratropium-albuterol, ondansetron **OR** ondansetron (ZOFRAN) IV   PHYSICAL EXAM: Vital signs: Vitals:   09/25/20 1954 09/25/20 2312 09/26/20 0341 09/26/20 0756  BP: (!) 156/87 (!) 157/90 (!) 144/90 (!) 161/85  Pulse: 84 77 83 84  Resp: Temp: 98.5 F (36.9 C) 97.7 F (36.5 C) 97.6 F (36.4 C) 97.9 F (36.6 C)  TempSrc: Oral Oral Oral Oral  SpO2: 95% 95% 95% 94%  Weight:      Height:       Filed Weights   09/20/20 2306  Weight: 90.3 kg   Body mass index is 30.27 kg/m.   Gen Exam:Alert awake-not in any distress HEENT:atraumatic, normocephalic Chest: B/L clear to auscultation anteriorly CVS:S1S2 regular Abdomen:soft non tender, non distended Extremities:no edema Neurology: Left upper extremity weakness-4/5-everywhere else 5/5 Skin: no rash  I have personally reviewed following labs and imaging studies  LABORATORY DATA: CBC: Recent Labs  Lab 09/20/20 2325 09/21/20 0441 09/22/20 0459 09/23/20 0528 09/23/20 1654 09/25/20 0355 09/26/20 0330  WBC 15.9*   < > 12.8* 13.4* 13.1* 11.7* 11.3*  NEUTROABS 11.6*  --  8.2* 8.9*  --   --   --   HGB 13.9   < > 13.1 13.7 13.7 13.3 13.1   HCT 41.8   < > 39.4 40.6 39.1 39.9 39.4  MCV 99.1   < > 99.7 98.1 95.6 97.3 97.5  PLT 260   < > 207 231 242 263 261   < > = values in this interval not displayed.    Basic Metabolic Panel: Recent Labs  Lab 09/21/20 0441 09/22/20 0459 09/23/20 0528 09/25/20 0355 09/26/20 0330  NA 137 138 136 139 138  K 3.5 3.4* 3.9 3.6 3.7  CL 102 104 103 104 104  CO2 GLUCOSE 140* 107* 122* 111* 101*  BUN CREATININE 1.03 0.90 0.84 0.93 0.98  CALCIUM 8.7* 8.3* 8.2* 8.5* 8.6*  MG  --  2.1 2.0  --   --   PHOS  --  2.7 2.4*  --   --     GFR: Estimated Creatinine Clearance: 66.7 mL/min (by C-G formula based on SCr of 0.98 mg/dL).  Liver Function Tests: Recent Labs  Lab 09/20/20 2325 09/22/20 0459 09/23/20 0528 09/25/20 0355 09/26/20 0330  AST 23 22 20 18 21   ALT 19 18 18 18 20   ALKPHOS 53 54 54 54 53  BILITOT 1.2 1.4* 1.2 1.0 1.0  PROT 7.8 6.7 6.5 6.2* 6.0*  ALBUMIN 4.2 3.7 3.4* 3.1* 3.1*   No results for input(s): LIPASE, AMYLASE in the last 168 hours. Recent Labs  Lab 09/21/20 0441  AMMONIA 13    Coagulation Profile: Recent Labs  Lab 09/20/20 2325  INR 1.3*    Cardiac Enzymes: No results for input(s): CKTOTAL, CKMB, CKMBINDEX, TROPONINI in the last 168 hours.  BNP (last 3 results) No results for input(s): PROBNP in the last 8760 hours.  Lipid Profile: Recent Labs    09/23/20 1654  CHOL 134  HDL 56  LDLCALC 64  TRIG 71  CHOLHDL 2.4    Thyroid Function Tests: No results for input(s): TSH, T4TOTAL, FREET4, T3FREE, THYROIDAB in the last 72 hours.  Anemia Panel: No results for input(s): VITAMINB12, FOLATE, FERRITIN, TIBC, IRON, RETICCTPCT in the last 72 hours.  Urine analysis:    Component Value Date/Time   COLORURINE YELLOW 09/21/2020 0200   APPEARANCEUR CLEAR 09/21/2020 0200   LABSPEC 1.019 09/21/2020 0200   PHURINE 6.0 09/21/2020 0200   GLUCOSEU NEGATIVE 09/21/2020 0200   GLUCOSEU NEGATIVE 09/20/2019 1146   HGBUR  SMALL (A) 09/21/2020 0200   BILIRUBINUR NEGATIVE 09/21/2020 0200   KETONESUR 20 (A) 09/21/2020 0200   PROTEINUR NEGATIVE 09/21/2020 0200   UROBILINOGEN 0.2 09/20/2019 1146   NITRITE NEGATIVE 09/21/2020 0200   LEUKOCYTESUR NEGATIVE 09/21/2020 0200    Sepsis Labs: Lactic Acid, Venous    Component Value Date/Time   LATICACIDVEN 1.7 09/21/2020 0131    MICROBIOLOGY: Recent Results (from the past 240 hour(s))  Blood culture (routine single)     Status: None (Preliminary result)   Collection Time: 09/20/20 11:25 PM   Specimen: BLOOD  Result Value Ref Range Status   Specimen Description   Final    BLOOD SPECIMEN SOURCE NOT MARKED ON REQUISITION Performed at [facility], [address] [place], [place]    Special Requests   Final    BOTTLES DRAWN AEROBIC AND ANAEROBIC Blood Culture results may not be optimal due to an inadequate volume of blood received in culture bottles Performed at [facility], [address] [place], [place]    Culture   Final    NO GROWTH 4 DAYS Performed at [facility], [address], [place], [address] [place]    Report Status PENDING  Incomplete  Resp Panel by RT-PCR (Flu A&B, Covid) Nasopharyngeal Swab     Status: None   Collection Time: 09/20/20 11:57 PM   Specimen: Nasopharyngeal Swab; Nasopharyngeal(NP) swabs in vial transport medium  Result Value Ref Range Status   SARS Coronavirus 2 by RT PCR NEGATIVE NEGATIVE Final    Comment: (NOTE) SARS-CoV-2 target nucleic acids are NOT DETECTED.  The SARS-CoV-2 RNA is generally detectable in upper respiratory specimens during the acute phase of infection. The lowest concentration of SARS-CoV-2 viral copies this assay can detect is 138 copies/mL. A negative result does not preclude SARS-Cov-2 infection and should not be used as the sole basis for treatment or other patient management decisions. A  negative result may occur with  improper  specimen collection/handling, submission of specimen other than nasopharyngeal swab, presence of viral mutation(s) within the areas targeted by this assay, and inadequate number of viral copies(<138 copies/mL). A negative result must be combined with clinical observations, patient history, and epidemiological information. The expected result is Negative.  Fact Sheet for Patients:  BloggerCourse.com  Fact Sheet for Healthcare Providers:  SeriousBroker.it  This test is no t yet approved or cleared by the [place] FDA and  has been authorized for detection and/or diagnosis of SARS-CoV-2 by FDA under an Emergency Use Authorization (EUA). This EUA will remain  in effect (meaning this test can be used) for the duration of the COVID-19 declaration under Section 564(b)(1) of the Act, 21 U.S.C.section 360bbb-3(b)(1), unless the authorization is terminated  or revoked sooner.       Influenza A by PCR NEGATIVE NEGATIVE Final   Influenza B by PCR NEGATIVE NEGATIVE Final    Comment: (NOTE) The Xpert Xpress SARS-CoV-2/FLU/RSV plus assay is intended as an aid in the diagnosis of influenza from Nasopharyngeal swab specimens and should not be used as a sole basis for treatment. Nasal washings and aspirates are unacceptable for Xpert Xpress SARS-CoV-2/FLU/RSV testing.  Fact Sheet for Patients: BloggerCourse.com  Fact Sheet for Healthcare Providers: SeriousBroker.it  This test is not yet approved or cleared by the [place] FDA and has been authorized for detection and/or diagnosis of SARS-CoV-2 by FDA under an Emergency Use Authorization (EUA). This EUA will remain in effect (meaning this test can be used) for the duration of the COVID-19 declaration under Section 564(b)(1) of the Act, 21 U.S.C. section 360bbb-3(b)(1), unless the authorization is terminated or revoked.  Performed at  [facility], [address], [place], [place] [address]   Urine culture     Status: None   Collection Time: 09/21/20  2:00 AM   Specimen: In/Out Cath Urine  Result Value Ref Range Status   Specimen Description   Final    IN/OUT CATH URINE Performed at [facility], [address] [place], [place] [address]    Special Requests   Final    NONE Performed at [facility], [address] [place], [place] [address]    Culture   Final    NO GROWTH Performed at [facility], [address] [place], [place] [address]    Report Status 09/22/2020 FINAL  Final  Culture, blood (routine x 2)     Status: None (Preliminary result)   Collection Time: 09/23/20  3:33 PM   Specimen: BLOOD RIGHT HAND  Result Value Ref Range Status   Specimen Description   Final    BLOOD RIGHT HAND Performed at [facility], [address], [place], [place] [address]    Special Requests   Final    BOTTLES DRAWN AEROBIC ONLY Blood Culture adequate volume Performed at [facility], [address] [place], [place] [address]    Culture   Final    NO GROWTH 2 DAYS Performed at [facility], [address], [place], [place] [address]    Report Status PENDING  Incomplete  Culture, blood (routine x 2)     Status: None (Preliminary result)   Collection Time: 09/23/20  3:38 PM   Specimen: BLOOD LEFT HAND  Result Value Ref Range Status   Specimen Description   Final    BLOOD LEFT HAND Performed at [facility], [address] [place], [place] [address]    Special Requests   Final    BOTTLES DRAWN AEROBIC AND ANAEROBIC Blood Culture adequate volume Performed at [facility], [address] [place], [place] [address]    Culture   Final    NO GROWTH 2 DAYS Performed at [facility], [address] [place], [place] [address]    Report Status PENDING  Incomplete     RADIOLOGY STUDIES/RESULTS: No results found.   LOS: 5 days   Jeoffrey MassedShanker Kemya Shed, MD  [facility]    To contact the attending provider between 7A-7P or the covering provider during after hours 7P-7A, please log into the web site [email or web address] and access using universal [place] password for that web site. If you do not have the password, please call the hospital operator.  09/26/2020, 9:04 AM

## 2020-09-26 NOTE — Progress Notes (Signed)
Patient transported to 4E Room 25 transported without difficulties

## 2020-09-26 NOTE — Op Note (Signed)
Patient name: Vincent Campbell MRN: [id number] DOB: [birth year] Sex: male  09/26/2020 Pre-operative Diagnosis: symptomatic right ica stenosis Post-operative diagnosis:  Same Surgeon:  Apolinar Junes C. Randie Heinz, MD Assistant: Durene Cal, MD Procedure Performed: 1.  US guided placement of left common femoral venous return 8Fr sheath 2.  Placement of 8 x 44mm Right ICA En Route stent via transcarotid route  Indications: 80 year old male with recent right cortical stroke.  He has high-grade stenosis of his right ICA he is indicated for stent.  Assistant was necessary to expedite the case.  Findings: There was greater than 80% stenosis just after the bifurcation of the carotid.  On ballooning the internal carotid artery patient did have asystole.  At completion there was less than 20% residual stenosis in the stent this was not postdilated given the complications with the original bleeding.  Patient was neurologically intact upon awakening from anesthesia.   Procedure:  The patient was identified in the holding area and taken to the operating was placed supine on upper table general anesthesia induced.  He was sterilely prepped and draped in the right neck and chest and bilateral groins in usual fashion, antibiotics were minister timeout was called.  Ultrasound was used to identify his common carotid artery and this was marked on the skin.  Ultrasound was used to identify the left common femoral vein.  This was cannulated with micropuncture needle followed by wire and sheath.  Bentson wires placed followed by 8 French sheath and this was sutured in place.  Vertical incision was made in between the 2 heads of sternocleidomastoid.  We dissected down with cautery.  2 heads were split.  The IJ was identified retracted laterally.  The common carotid artery was identified patient was fully heparinized.  Initially heparin did infiltrate we did give an additional dose of 10,000 ACT returned to 90.  We placed a vessel  loop umbilical tape around the common carotid artery.  A 5-0 Prolene figure-of-eight suture was placed in the common carotid artery.  Was cannulated micropuncture needle followed by wire to 3 cm in sheath 3 cm.  Angiography was performed which demonstrated our stenosis.  We placed an Amplatz wire into the external carotid artery and then placed our flow reversal sheath.  This was sutured to the skin.  Flow reversal was initiated and confirmed.  Angiography was performed which demonstrated our stenosis again.  The TCAR timeout was performed the common carotid artery was clamped and flow reversal was again confirmed.  We used a Kumpe catheter and 014 wire to cross the stenosis and confirmed this in place.  We primarily ballooned the internal carotid artery which did result in asystole which briskly returned after administration of atropine.  We then primarily stented with an 8 x 40 mm stent.  Completion in 2 views did not demonstrate any tight stenoses all was less than 20% and the stent was in good positioning.  We elected not to post dilate.  With this the wire was removed the wire and the clamp was removed.  The common carotid artery was clamped with a vessel loop.  We then returned blood from the reversal tubing to the groin.  We removed the sheath in the neck and cinched our 5-0 Prolene suture.  We confirmed flow with Doppler and administered 50 mg of protamine.  We irrigated our neck wound and obtain hemostasis and closed in layers the platysma with Vicryl and the skin with Monocryl and Dermabond is placed to the level  of the skin.  The sheath was pulled from the left groin pressure held till hemostasis obtained.  He was then awakened from anesthesia having tolerated procedure without any complication.  All counts were correct at completion.   EBL: 50cc  Siona Coulston C. Randie Heinz, MD [facility] Office: [phone] Pager: [phone]

## 2020-09-26 NOTE — Anesthesia Preprocedure Evaluation (Addendum)
Anesthesia Evaluation  Patient identified by MRN, date of birth, ID band Patient awake    Reviewed: Allergy & Precautions, NPO status , Patient's Chart, lab work & pertinent test results, reviewed documented beta blocker date and time   History of Anesthesia Complications Negative for: history of anesthetic complications  Airway Mallampati: II  TM Distance: >3 FB Neck ROM: Full    Dental  (+) Partial Upper, Poor Dentition, Dental Advisory Given   Pulmonary Current Smoker and Patient abstained from smoking.,    Pulmonary exam normal        Cardiovascular hypertension, Pt. on medications and Pt. on home beta blockers Normal cardiovascular exam   '22 Carotid US - 80-99% right ICAS, 1-39% left ICAS  '22 TTE - EF 60 to 65%. Grade I diastolic dysfunction (impaired relaxation). Left atrial size was mildly dilated. No significant valvulopathy noted.    Neuro/Psych  Hard of hearing  CVA negative psych ROS   GI/Hepatic negative GI ROS, Neg liver ROS,   Endo/Other  diabetes, Type 2, Oral Hypoglycemic AgentsHypothyroidism  Obesity   Renal/GU      Musculoskeletal negative musculoskeletal ROS (+)   Abdominal   Peds  Hematology negative hematology ROS (+)   Anesthesia Other Findings Covid test negative 09/20/20    Reproductive/Obstetrics                            Anesthesia Physical Anesthesia Plan  ASA: III  Anesthesia Plan: General   Post-op Pain Management:    Induction: Intravenous  PONV Risk Score and Plan: 2 and Treatment may vary due to age or medical condition, Ondansetron and Dexamethasone  Airway Management Planned: Oral ETT  Additional Equipment: Arterial line  Intra-op Plan:   Post-operative Plan: Extubation in OR  Informed Consent: I have reviewed the patients History and Physical, chart, labs and discussed the procedure including the risks, benefits and alternatives  for the proposed anesthesia with the patient or authorized representative who has indicated his/her understanding and acceptance.     Dental advisory given  Plan Discussed with: CRNA and Anesthesiologist  Anesthesia Plan Comments:        Anesthesia Quick Evaluation

## 2020-09-27 ENCOUNTER — Encounter (HOSPITAL_COMMUNITY): Payer: Self-pay | Admitting: Vascular Surgery

## 2020-09-27 ENCOUNTER — Telehealth: Payer: Self-pay | Admitting: Internal Medicine

## 2020-09-27 DIAGNOSIS — G9340 Encephalopathy, unspecified: Secondary | ICD-10-CM | POA: Diagnosis not present

## 2020-09-27 DIAGNOSIS — E1122 Type 2 diabetes mellitus with diabetic chronic kidney disease: Secondary | ICD-10-CM | POA: Diagnosis not present

## 2020-09-27 DIAGNOSIS — N182 Chronic kidney disease, stage 2 (mild): Secondary | ICD-10-CM | POA: Diagnosis not present

## 2020-09-27 LAB — COMPREHENSIVE METABOLIC PANEL
ALT: 21 U/L (ref 0–44)
AST: 22 U/L (ref 15–41)
Albumin: 3.3 g/dL — ABNORMAL LOW (ref 3.5–5.0)
Alkaline Phosphatase: 60 U/L (ref 38–126)
Anion gap: 11 (ref 5–15)
BUN: 11 mg/dL (ref 8–23)
CO2: 22 mmol/L (ref 22–32)
Calcium: 8.4 mg/dL — ABNORMAL LOW (ref 8.9–10.3)
Chloride: 103 mmol/L (ref 98–111)
Creatinine, Ser: 0.93 mg/dL (ref 0.61–1.24)
GFR, Estimated: >60 mL/min (ref >60)
Glucose, Bld: 156 mg/dL — ABNORMAL HIGH (ref 70–99)
Potassium: 4.4 mmol/L (ref 3.5–5.1)
Sodium: 136 mmol/L (ref 135–145)
Total Bilirubin: 1 mg/dL (ref 0.3–1.2)
Total Protein: 6.3 g/dL — ABNORMAL LOW (ref 6.5–8.1)

## 2020-09-27 LAB — CBC
HCT: 38 % — ABNORMAL LOW (ref 39.0–52.0)
Hemoglobin: 12.9 g/dL — ABNORMAL LOW (ref 13.0–17.0)
MCH: 32.9 pg (ref 26.0–34.0)
MCHC: 33.9 g/dL (ref 30.0–36.0)
MCV: 96.9 fL (ref 80.0–100.0)
Platelets: 282 10*3/uL (ref 150–400)
RBC: 3.92 MIL/uL — ABNORMAL LOW (ref 4.22–5.81)
RDW: 13.5 % (ref 11.5–15.5)
WBC: 13.4 10*3/uL — ABNORMAL HIGH (ref 4.0–10.5)
nRBC: 0 % (ref 0.0–0.2)

## 2020-09-27 LAB — GLUCOSE, CAPILLARY: Glucose-Capillary: 150 mg/dL — ABNORMAL HIGH (ref 70–99)

## 2020-09-27 MED ORDER — PANTOPRAZOLE SODIUM 40 MG PO TBEC: 40.0000 mg | DELAYED_RELEASE_TABLET | Freq: Every day | ORAL | 0 refills | Status: DC

## 2020-09-27 MED ORDER — CLOPIDOGREL BISULFATE 75 MG PO TABS: 75.0000 mg | ORAL_TABLET | Freq: Every day | ORAL | 1 refills | Status: DC

## 2020-09-27 MED ORDER — HALOPERIDOL LACTATE 5 MG/ML IJ SOLN
2.0000 mg | Freq: Once | INTRAMUSCULAR | Status: AC | PRN
  Administered 2020-09-27: 2 mg via INTRAVENOUS
  Filled 2020-09-27: qty 1

## 2020-09-27 NOTE — Progress Notes (Signed)
STROKE TEAM PROGRESS NOTE   INTERVAL HISTORY Patient is sitting up in bed. He had  right carotid revascularization with TCAR procedure y`day and went well. No complaints. Wanting to go home.  Vitals:   09/26/20 2300 09/26/20 2331 09/27/20 0655 09/27/20 0811  BP:   (!) 143/78 109/71  Pulse:   79 82  Resp:   18 18  Temp: 97.9 F (36.6 C) 97.9 F (36.6 C) 97.7 F (36.5 C) 97.6 F (36.4 C)  TempSrc: Axillary Axillary Axillary Oral  SpO2:   96% 91%  Weight:      Height:       CBC:  Recent Labs  Lab 09/22/20 0459 09/23/20 0528 09/23/20 1654 09/26/20 0330 09/27/20 0605  WBC 12.8* 13.4*   < > 11.3* 13.4*  NEUTROABS 8.2* 8.9*  --   --   --   HGB 13.1 13.7   < > 13.1 12.9*  HCT 39.4 40.6   < > 39.4 38.0*  MCV 99.7 98.1   < > 97.5 96.9  PLT 207 231   < > 261 282   < > = values in this interval not displayed.   Basic Metabolic Panel:  Recent Labs  Lab 09/22/20 0459 09/23/20 0528 09/25/20 0355 09/26/20 0330 09/27/20 0605  NA 138 136   < > 138 136  K 3.4* 3.9   < > 3.7 4.4  CL 104 103   < > 104 103  CO2 23 23   < > 22 22  GLUCOSE 107* 122*   < > 101* 156*  BUN 12 11   < > 14 11  CREATININE 0.90 0.84   < > 0.98 0.93  CALCIUM 8.3* 8.2*   < > 8.6* 8.4*  MG 2.1 2.0  --   --   --   PHOS 2.7 2.4*  --   --   --    < > = values in this interval not displayed.   Lipid Panel:  Recent Labs  Lab 09/23/20 1654  CHOL 134  TRIG 71  HDL 56  CHOLHDL 2.4  VLDL 14  LDLCALC 64   HgbA1c:  Recent Labs  Lab 09/23/20 1654  HGBA1C 7.0*   Urine Drug Screen: No results for input(s): LABOPIA, COCAINSCRNUR, LABBENZ, AMPHETMU, THCU, LABBARB in the last 168 hours.  Alcohol Level  Recent Labs  Lab 09/21/20 0440  ETH <10    IMAGING past 24 hours CT ANGIO HEAD W OR WO CONTRAST  Result Date: 09/23/2020  IMPRESSION: Head CT: Multifocal small right cerebrum infarcts are better demonstrated on same day MRI. Chronic right basal ganglia and left corona radiata lacunar insults. CTA neck:  At least 90% proximal right ICA narrowing. Less than 50% proximal left ICA narrowing. CTA head: Mild to moderate right V4 segment narrowing. Mild right supraclinoid ICA and left PCA narrowing. These results will be called to the ordering clinician or representative by the Radiologist Assistant, and communication documented in the PACS or [employer]. Electronically Signed   By: Stana Bunting M.D.   On: 09/23/2020 19:55   CT ANGIO NECK W OR WO CONTRAST  Result Date: 09/23/2020  IMPRESSION: Head CT: Multifocal small right cerebrum infarcts are better demonstrated on same day MRI. Chronic right basal ganglia and left corona radiata lacunar insults. CTA neck: At least 90% proximal right ICA narrowing. Less than 50% proximal left ICA narrowing. CTA head: Mild to moderate right V4 segment narrowing. Mild right supraclinoid ICA and left PCA narrowing.  MR  BRAIN WO CONTRAST  Result Date: 09/23/2020  IMPRESSION: Scattered small subacute infarcts involving the right cerebrum, right basal ganglia and anterior corpus callosum. Consider CTA head and neck. Chronic right basal ganglia and left corona radiata lacunar insults. Mild chronic microvascular ischemic changes.  DG CHEST PORT 1 VIEW  Result Date: 09/23/2020 IMPRESSION: 1. Low lung volumes without radiographic evidence of acute cardiopulmonary disease.   VAS US CAROTID  Result Date: 09/24/2020 Summary: Right Carotid: Velocities in the right ICA are consistent with a 80-99%                stenosis. Left Carotid: Velocities in the left ICA are consistent with a 1-39% stenosis. Vertebrals:  Bilateral vertebral arteries demonstrate antegrade flow. Subclavians: Normal flow hemodynamics were seen in bilateral subclavian              arteries. *See table(s) above for measurements and observations.     Preliminary    Result Date 09/21/2020 CT Cspine 1. Moderate to marked severity degenerative changes at the levels of C5-C6 and C6-C7. 2. No evidence  of an acute fracture or subluxation.   PHYSICAL EXAM Physical Exam  Constitutional: Appears well-developed and well-nourished.  Psych: Affect appropriate to situation. Cooperative with examination.  Eyes: Normal conjunctiva HENT: No OP obstruction, hearing aid in place right ear, hard of hearing at baseline Head: Normocephalic and atraumatic without obvious deformity Cardiovascular: Extremities warm, well perfused. 2+ edema in bilateral lower extremities.  Respiratory: Effort normal, non-labored breathing.  GI: Soft.  Rounded. There is no tenderness.  Skin: WDI Rt TCAR procedure scar in neck Neuro: Mental Status: Patient is awake, alert, oriented to person, and situation. He is able to state name correctly. Patient is able to provide some details of current history with some confusion noted. He stated that he has had problems with function in his left hand, arm, and leg. Follows commands with some mimic of examiner due to patient hard of hearing.  No signs of aphasia or neglect noted. Cranial Nerves: II: Visual Fields are full. Pupils are equal, round, and reactive to light 2 mm/brisk III,IV, VI: EOMI without ptosis. V: Facial sensation is symmetric to light touch.  VII: Face is symmetric resting and smiling. Subtle palmomental reflex noted.  VIII: patient is hard of hearing no hearing aid in place in the right ear X: Phonation normal.  XI: Shoulder shrug is symmetric. XII: Tongue protrudes midline. Motor: Tone is normal. Bulk is normal. 5/5 strength in right upper and lower extremities and left lower extremity. Left upper extremity with 4+/5 strength. No pronator drift noted.  Sensory: Sensation is symmetric to light touch in the arms and legs. Cerebellar: FNF diminished  ASSESSMENT/PLAN [name] is a 80 y.o. male with history of hypertension, hyperlipidemia, hypothyroidism, mild renal insufficiency, and type 2 diabetes who presented to [facility] on 2/25 for evaluation of  lethargy and confusion.   At home he was noted to be more confused and forgetful.  He was taken to the ED.  ED work-up reveled oxygen saturation in the low 90s on room air, chest x-ray with bibasilar opacities, a creatinine of 1.23 but with a leukocytosis with WBC of 15,900. He was initiated on antibiotics and admitted for further work-up.  An MRI was obtained 2/27 for further evaluation of confusion with evidence of scattered small subacute infarcts involving the right cerebrum, right basal ganglia, and anterior corpus callosum with chronic right basal ganglia and left corona radiata lacunar infarcts.  Stroke: Scattered  small subacute infarcts involving the right cerebrum, right basal ganglia and anterior corpus callosum likely due to right carotid stenosis.  CT head: Generalized cerebral atrophy.  No acute intracranial abnormality.    CTA head Mild to moderate right V4 segment narrowing.  Mild right supraclinoid ICA and left PCA narrowing  CTA neck At least 90% proximal right ICA narrowing. Less than 50% proximal  left ICA narrowing.  CT perfusion not done   MRI Scattered small subacute infarcts involving the right cerebrum, right basal ganglia and anterior corpus callosum. Chronic right basal ganglia and left corona radiata lacunar insults.  Mild chronic microvascular ischemic changes  Carotid Doppler right ICA are consistent with a 80-99% stenosis. left ICA 1-39% stenosis  2D Echo EF 60-65%, Grade 1 diastolic dysfunction  LDL 64  IPJA2N 7.0  VTE prophylaxis - Lovenox 40mg  daily  aspirin 81 mg daily prior to admission, now on aspirin 81 mg daily and clopidogrel 75 mg daily for at least 3 months per vascular surgery  Therapy recommendations:  Home health PT, home health OT Supervision/Assistance - 24 hour   Disposition:  pending  Right Carotid artery stenosis  >80%  vasculary surgery consulted, appreciate recs  Aspirin and plavix for at least 3 months  Planning for  right sided Transcarotid artery stent on Wednesday  Hypertension  Home meds: HCTZ 12.5 daily, losartan 100mg  daily, propranolol 10mg  bid  Stable . Long-term BP goal normotensive  Hyperlipidemia  Home meds:  Pravastatin 40mg  daily, resumed in hospital  LDL 64, goal < 70  Continue statin at discharge  Diabetes type II Controlled  Home meds:  Metformin 1500mg  daily  HgbA1c 7.0, goal < 7.0  CBGs Recent Labs    09/26/20 1749 09/26/20 2259 09/27/20 0650  GLUCAP 99 146* 150*      SSI  Other Stroke Risk Factors  Advanced Age >/= 100   Cigarette smoker, former, quit 181 days ago  ETOH use, alcohol level <10, drinks liquor every day, unclear amount; concerned that he may be hiding how much he truly drinks from her per previous discussion  advised to drink no more than 1-2 drink(s) a day  Obesity, Body mass index is 30.27 kg/m., BMI >/= 30 associated with increased stroke risk, recommend weight loss, diet and exercise as appropriate   Hx stroke: MRI finding: chronic right basal ganglia and left corona radiata lacunar insults.  Other Active Problems  Hypothyroidism  CKD stage II  Hospital day # 6 Continue aspirin and Plavix.Dc home. F/u as outpt stroke clinic in 6 weeks. , MD Medical Director [facility] Pager: [phone] 09/27/2020 2:19 PM   To contact Stroke Continuity provider, please refer to 76. After hours, contact General Neurology

## 2020-09-27 NOTE — Telephone Encounter (Signed)
Ok for verbals 

## 2020-09-27 NOTE — Progress Notes (Signed)
Physical Therapy Treatment Patient Details Name: Vincent Campbell MRN: [id number] DOB: Dec 18, 1940 Today's Date: 09/27/2020    History of Present Illness 80 y.o. male presenting with lethargy and AMS. Patient admitted with acute encephalopathy and suspicion for PNA. MRI (+) scattered small subacute infarcts involving the right cerebrum, right basal ganglia and anterior corpus callosum. CT (+) at least 90% proximal right ICA narrowing and less than 50% proximal left ICA narrowing. Further work-up pending. 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 Needed restraints overnight 3/2 due to agitation.    PT Comments    Pt received in chair and spouse present in room, pt irritable but agreeable to therapy session with encouragement, and with good progress toward mobility goals this date. Pt progressed gait distance to 236ft using RW and mostly Supervision, at times min guard due to decreased L awareness in tight spaces in room (chair follow but pt did not need seated or standing breaks). He also ascended/descended 2 steps with B rails and min guard assist. Pt VSS throughout on RA. Pt continues to benefit from PT services to progress toward functional mobility goals. Anticipate pt ready to DC home once medically cleared but he would benefit from increased supervision/assist from spouse at home due to decreased L awareness.  Follow Up Recommendations  Home health PT;Supervision for mobility/OOB     Equipment Recommendations  None recommended by PT (he has RW and BSC per chart review)    Recommendations for Other Services       Precautions / Restrictions Precautions Precautions: Fall Precaution Comments: High fall risk, very HOH (has hearing amplifier) Restrictions Weight Bearing Restrictions: No    Mobility  Bed Mobility Overal bed mobility: Needs Assistance             General bed mobility  comments: received up in chair    Transfers Overall transfer level: Needs assistance Equipment used: Rolling walker (2 wheeled) Transfers: Sit to/from Stand Sit to Stand: Supervision         General transfer comment: good hand placement; impulsive to stand prior to staff placing RW in front of him but listens to cues to sit back downa dn wait and also cues for safety with line mgmt  Ambulation/Gait Ambulation/Gait assistance: Supervision Gait Distance (Feet): 230 Feet Assistive device: Rolling walker (2 wheeled) Gait Pattern/deviations: Decreased step length - right;Decreased step length - left;Trunk flexed;Step-through pattern Gait velocity: variable; 0.3-0.5 m/s   General Gait Details: pt maintained L hand on RW throughout ambulation but tends to step out of RW, especially with turning. Cues given and education to wife for assisting him safely at home.   Stairs Stairs: Yes Stairs assistance: Min guard Stair Management: Step to pattern;Forwards;Two rails Number of Stairs: 2 General stair comments: forward step-to gait pattern with cues to ascend with strong leg and descend with weaker leg, good command following, pt preferring B handrails although R rail far from L rail but pt able to reach both; no LOB   Wheelchair Mobility    Modified Rankin (Stroke Patients Only) Modified Rankin (Stroke Patients Only) Pre-Morbid Rankin Score: Slight disability Modified Rankin: Moderately severe disability     Balance Overall balance assessment: Needs assistance Sitting-balance support: Feet supported;No upper extremity supported Sitting balance-Leahy Scale: Good Sitting balance - Comments: static sitting without difficulty   Standing balance support: During functional activity Standing balance-Leahy Scale: Fair Standing balance comment: standing with no AD able to  don mask and pivot to chair without LOB and Supervision; with gait lightly reliant on RW                             Cognition Arousal/Alertness: Awake/alert Behavior During Therapy: WFL for tasks assessed/performed Overall Cognitive Status: Impaired/Different from baseline Area of Impairment: Safety/judgement                         Safety/Judgement: Decreased awareness of safety;Decreased awareness of deficits     General Comments: decreased attention to L side and decreased safety awareness, impulsive to initiate mobility and decreased line awareness      Exercises      General Comments        Pertinent Vitals/Pain Pain Assessment: Faces Faces Pain Scale: Hurts little more Pain Location: "all over" pt unable to localize, generalized discomfort from bed (likely back soreness and incisional discomfort?) Pain Descriptors / Indicators: Discomfort;Grimacing Pain Intervention(s): Monitored during session;Repositioned    Home Living                      Prior Function            PT Goals (current goals can now be found in the care plan section) Acute Rehab PT Goals Patient Stated Goal: To return home with wife. PT Goal Formulation: With patient/family Time For Goal Achievement: 10/05/20 Potential to Achieve Goals: Good Progress towards PT goals: Progressing toward goals    Frequency    Min 3X/week      PT Plan Current plan remains appropriate    Co-evaluation              AM-PAC PT "6 Clicks" Mobility   Outcome Measure  Help needed turning from your back to your side while in a flat bed without using bedrails?: None Help needed moving from lying on your back to sitting on the side of a flat bed without using bedrails?: A Little Help needed moving to and from a bed to a chair (including a wheelchair)?: A Little Help needed standing up from a chair using your arms (e.g., wheelchair or bedside chair)?: A Little Help needed to walk in hospital room?: A Little Help needed climbing 3-5 steps with a railing? : A Little 6 Click Score: 19     End of Session Equipment Utilized During Treatment: Gait belt Activity Tolerance: Patient tolerated treatment well Patient left: with family/visitor present;with call bell/phone within reach;in chair Nurse Communication: Mobility status (OK for DC from PT standpoint, pt anxious to DC; no chair alarm in place but spouse will be present) PT Visit Diagnosis: Other abnormalities of gait and mobility (R26.89);Unsteadiness on feet (R26.81)     Time: 1013-1030 PT Time Calculation (min) (ACUTE ONLY): 17 min  Charges:  $Gait Training: 8-22 mins                     Amethyst Gainer P., PTA Acute Rehabilitation Services Pager: [phone] Office: [phone]   Dorathy Kinsman Alexanderjames Berg 09/27/2020, 11:20 AM

## 2020-09-27 NOTE — TOC Transition Note (Signed)
Transition of Care (TOC) - CM/SW Discharge Note Donn Pierini RN, BSN Transitions of Care Unit 4E- RN Case Manager See Treatment Team for direct phone #    Patient Details  Name: Vincent Campbell MRN: [id number] Date of Birth: 08/28/1940  Transition of [facility]) CM/SW Contact:  Darrold Span, RN Phone Number: 09/27/2020, 11:21 AM   Clinical Narrative:    Pt stable for transition home today, orders have been placed for HHPT/OT- noted per previous CM note referral sent to [facility], Call made to confirm [facility] referral with Eber Jones from [facility]- referral has been accepted for [facility] needs- they will contact pt for start of care post discharge within 48 hr.  No DME needs noted s/p CEA, pt to return home with family.     Final next level of care: Home w Home Health Services Barriers to Discharge: Barriers Resolved   Patient Goals and CMS Choice Patient states their goals for this hospitalization and ongoing recovery are:: return home CMS Medicare.gov Compare Post Acute Care list provided to:: Patient Choice offered to / list presented to : Patient,Spouse  Discharge Placement               Home with [facility]        Discharge Plan and Services   Discharge Planning Services: CM Consult Post Acute Care Choice: Home Health          DME Arranged: N/A DME Agency: NA       HH Arranged: PT,OT HH Agency: [facility] Date [facility] Agency Contacted: 09/24/20   Representative spoke with at [facility] Agency: Eber Jones  Social Determinants of Health (SDOH) Interventions     Readmission Risk Interventions Readmission Risk Prevention Plan 09/27/2020  Transportation Screening Complete  PCP or Specialist Appt within 5-7 Days Complete  Home Care Screening Complete  Medication Review (RN CM) Complete  Some recent data might be hidden

## 2020-09-27 NOTE — Progress Notes (Addendum)
  Progress Note    09/27/2020 7:59 AM 1 Day Post-Op  Subjective:  Pt awake and follows commands  Afebrile HR 70's-120's NSR 130's-140's systolic 91% 3LO2NC  Gtts:  None  Vitals:   09/26/20 2331 09/27/20 0655  BP:  (!) 143/78  Pulse:  79  Resp:  18  Temp: 97.9 F (36.6 C) 97.7 F (36.5 C)  SpO2:  96%     Physical Exam: Neuro:  At baseline; tongue is midline Lungs:  Non labored Incision:  Right neck incision is clean and dry; left groin soft without hematoma.    CBC    Component Value Date/Time   WBC 11.3 (H) 09/26/2020 0330   RBC 4.04 (L) 09/26/2020 0330   HGB 13.1 09/26/2020 0330   HCT 39.4 09/26/2020 0330   PLT 261 09/26/2020 0330   MCV 97.5 09/26/2020 0330   MCH 32.4 09/26/2020 0330   MCHC 33.2 09/26/2020 0330   RDW 13.7 09/26/2020 0330   LYMPHSABS 1.9 09/23/2020 0528   MONOABS 2.1 (H) 09/23/2020 0528   EOSABS 0.3 09/23/2020 0528   BASOSABS 0.1 09/23/2020 0528    BMET    Component Value Date/Time   NA 138 09/26/2020 0330   K 3.7 09/26/2020 0330   CL 104 09/26/2020 0330   CO2 22 09/26/2020 0330   GLUCOSE 101 (H) 09/26/2020 0330   BUN 14 09/26/2020 0330   CREATININE 0.98 09/26/2020 0330   CALCIUM 8.6 (L) 09/26/2020 0330   GFRNONAA >60 09/26/2020 0330   GFRAA 66 (L) 11/28/2013 1238     Intake/Output Summary (Last 24 hours) at 09/27/2020 0759 Last data filed at 09/27/2020 0600 Gross per 24 hour  Intake 1704.52 ml  Output 110 ml  Net 1594.52 ml     Assessment/Plan:  This is a 80 y.o. male who is s/p  1.  US guided placement of left common femoral venous return 8Fr sheath 2.  Placement of 8 x 52mm Right ICA En Route stent via transcarotid route 1 Day Post-Op   -pt seen with Dr. Myra Gianotti and pt is doing well this am from vascular standpoint-he did need to be in 4 point restraints overnight but is awake this morning and following commands. -pt neuro exam is in tact; tongue is midline; neuro exam at baseline -continue plavix/asa -f/u with VVS in 4  weeks with carotid duplex   Doreatha Massed, PA-C [facility] [phone]   I agree with the above.  POD#1. S/p left TCAR.  Neuro intact, incision clean and dry.Stable for d/c when medically ready.  Continue ASA, plavix, statin  Durene Cal

## 2020-09-27 NOTE — Progress Notes (Signed)
Discharge summary reviewed with patient and spouse.  Hard copy prescriptions given for new medications.   IV access d/c CCMD notified.   Assisted to private vehicle via volunteers in w/c.

## 2020-09-27 NOTE — Plan of Care (Signed)
  Problem: Education: Goal: Knowledge of General Education information will improve Description: Including pain rating scale, medication(s)/side effects and non-pharmacologic comfort measures Outcome: Adequate for Discharge   Problem: Health Behavior/Discharge Planning: Goal: Ability to manage health-related needs will improve Outcome: Adequate for Discharge   Problem: Clinical Measurements: Goal: Ability to maintain clinical measurements within normal limits will improve Outcome: Adequate for Discharge Goal: Will remain free from infection Outcome: Adequate for Discharge Goal: Diagnostic test results will improve Outcome: Adequate for Discharge Goal: Respiratory complications will improve Outcome: Adequate for Discharge Goal: Cardiovascular complication will be avoided Outcome: Adequate for Discharge   Problem: Activity: Goal: Risk for activity intolerance will decrease Outcome: Adequate for Discharge   Problem: Nutrition: Goal: Adequate nutrition will be maintained Outcome: Adequate for Discharge   Problem: Coping: Goal: Level of anxiety will decrease Outcome: Adequate for Discharge   Problem: Elimination: Goal: Will not experience complications related to bowel motility Outcome: Adequate for Discharge Goal: Will not experience complications related to urinary retention Outcome: Adequate for Discharge   Problem: Pain Managment: Goal: General experience of comfort will improve Outcome: Adequate for Discharge   Problem: Safety: Goal: Ability to remain free from injury will improve Outcome: Adequate for Discharge   Problem: Skin Integrity: Goal: Risk for impaired skin integrity will decrease Outcome: Adequate for Discharge   Problem: Ischemic Stroke/TIA Tissue Perfusion: Goal: Complications of ischemic stroke/TIA will be minimized Outcome: Adequate for Discharge   Problem: Safety: Goal: Non-violent Restraint(s) Outcome: Adequate for Discharge

## 2020-09-27 NOTE — Discharge Summary (Signed)
PATIENT DETAILS Name: Vincent Campbell Age: 80 y.o. Sex: male Date of Birth: 02-15-1941 MRN: [id number]. Admitting Physician: Merlene Laughter, DO WUJ:WJXB, Len Blalock, MD  Admit Date: 09/20/2020 Discharge date: 09/27/2020  Recommendations for Outpatient Follow-up:  1. Follow up with PCP in 1-2 weeks 2. Please obtain CMP/CBC in one week 3. Please ensure follow-up with vascular surgery/stroke clinic  Admitted From:  Home  Disposition: Home with home health services   Home Health:  Yes  Equipment/Devices: None  Discharge Condition: Stable  CODE STATUS: FULL CODE  Diet recommendation:  Diet Order            Diet heart healthy/carb modified Room service appropriate? Yes; Fluid consistency: Thin  Diet effective now           Diet - low sodium heart healthy           Diet Carb Modified                  Brief Narrative: Patient is a 80 y.o. male with history of DM-2, HTN, hypothyroidism, CKD stage II, EtOH use presented with lethargy/confusion/difficulty ambulating-further evaluation revealed acute CVA.  Significant events: 2/24>> admit for lethargic/difficulty ambulating-work-up reviewed CVA  Significant studies: 2/25>> CT brain: No acute intracranial abnormality. 2/25>> CT C-spine: No acute fracture or subluxation 2/27>> MRI brain: Scattered infarcts-right cerebrum/right basal ganglia/anterior corpus callosum 2/28>> carotid Doppler: Right ICA-80-99% stenosis, left ICA 1-29% stenosis. 2/27>> CTA neck: 90% narrowing of right ICA, 50% proximal left ICA narrowing 2/27>> CTA head: Moderate right V4 segment narrowing 2/27>> LDL: 64 2/27>> A1c: 7.0 2/28>> Echo: EF 60-65%, grade 1 diastolic dysfunction  Antimicrobial therapy: None  Microbiology data: 2/24>> blood culture: No growth 2/25>> urine culture: No growth 2/27>> blood culture: No growth  Procedures : 3/2>> right carotid artery stent placement  Consults: Neurology Vascular  surgery   Brief Hospital Course: Acute embolic CVA: Due to right ICA stenosis-has slight left upper extremity weakness on exam-vascular surgery performed right carotid stenting.  Evaluated by vascular surgery-PT/neurology on the day of discharge-okay to discharge on antiplatelet agents and statin.  Patient asked to follow with neurology, vascular surgery as instructed by them.    Right ICA stenosis: See above.  Acute metabolic encephalopathy/delirium superimposed on dementia: Had sundowning last night-currently completely awake and alert.  EtOH use: No signs of withdrawal-treated with Ativan per CIWA protocol.  AKI on CKD stage II: AKI resolved-creatinine back to baseline  Hypothyroidism: TSH within normal limits-resume Synthroid on discharge-spoke with spouse on the day of discharge.    HTN: BP creeping up-resume usual antihypertensive regimen.    DM-2: CBGs stable managed with SSI-resume oral hypoglycemics on discharge.  Obesity: Estimated body mass index is 30.27 kg/m as calculated from the following:   Height as of this encounter:  (1.727 m).   Weight as of this encounter: 90.3 kg.    Discharge Diagnoses:  Principal Problem:   Encephalopathy acute Active Problems:   Essential hypertension   Diabetes (HCC)   Hypothyroidism   CKD (chronic kidney disease), stage II   Alcohol use   Cerebral thrombosis with cerebral infarction   Discharge Instructions:  Activity:  As tolerated with Full fall precautions use walker/cane & assistance as needed   Discharge Instructions    Ambulatory referral to Neurology   Complete by: As directed    An appointment is requested in approximately: 4 weeks   Call MD for:  extreme fatigue   Complete by: As directed  Diet - low sodium heart healthy   Complete by: As directed    Diet Carb Modified   Complete by: As directed    Discharge instructions   Complete by: As directed    Follow with Primary MD  Corwin Levins, MD  in 1-2 weeks  Please get a complete blood count and chemistry panel checked by your Primary MD at your next visit, and again as instructed by your Primary MD.  Get Medicines reviewed and adjusted: Please take all your medications with you for your next visit with your Primary MD  Laboratory/radiological data: Please request your Primary MD to go over all hospital tests and procedure/radiological results at the follow up, please ask your Primary MD to get all Hospital records sent to his/her office.  In some cases, they will be blood work, cultures and biopsy results pending at the time of your discharge. Please request that your primary care M.D. follows up on these results.  Also Note the following: If you experience worsening of your admission symptoms, develop shortness of breath, life threatening emergency, suicidal or homicidal thoughts you must seek medical attention immediately by calling 911 or calling your MD immediately  if symptoms less severe.  You must read complete instructions/literature along with all the possible adverse reactions/side effects for all the Medicines you take and that have been prescribed to you. Take any new Medicines after you have completely understood and accpet all the possible adverse reactions/side effects.   Do not drive when taking Pain medications or sleeping medications (Benzodaizepines)  Do not take more than prescribed Pain, Sleep and Anxiety Medications. It is not advisable to combine anxiety,sleep and pain medications without talking with your primary care practitioner  Special Instructions: If you have smoked or chewed Tobacco  in the last 2 yrs please stop smoking, stop any regular Alcohol  and or any Recreational drug use.  Wear Seat belts while driving.  Please note: You were cared for by a hospitalist during your hospital stay. Once you are discharged, your primary care physician will handle any further medical issues. Please note that NO  REFILLS for any discharge medications will be authorized once you are discharged, as it is imperative that you return to your primary care physician (or establish a relationship with a primary care physician if you do not have one) for your post hospital discharge needs so that they can reassess your need for medications and monitor your lab values.   Increase activity slowly   Complete by: As directed    No wound care   Complete by: As directed      Allergies as of 09/27/2020   No Known Allergies     Medication List    STOP taking these medications   propranolol 10 MG tablet Commonly known as: INDERAL     TAKE these medications   aspirin 81 MG tablet Take 81 mg by mouth daily.   clopidogrel 75 MG tablet Commonly known as: PLAVIX Take 1 tablet (75 mg total) by mouth daily. Start taking on: September 28, 2020   Euthyrox 25 MCG tablet Generic drug: levothyroxine TAKE 1 TABLET BY MOUTH ONCE DAILY BEFORE BREAKFAST What changed:   how much to take  how to take this  when to take this  additional instructions   Glucosamine 500 MG Caps Take 500 mg by mouth daily.   hydrochlorothiazide 12.5 MG capsule Commonly known as: MICROZIDE Take 1 capsule (12.5 mg total) by mouth daily.   losartan  25 MG tablet Commonly known as: COZAAR Take 4 tablets (100 mg total) by mouth daily. What changed: Another medication with the same name was removed. Continue taking this medication, and follow the directions you see here.   metFORMIN 500 MG 24 hr tablet Commonly known as: GLUCOPHAGE-XR Take 3 tablets (1,500 mg total) by mouth daily with breakfast.   pantoprazole 40 MG tablet Commonly known as: PROTONIX Take 1 tablet (40 mg total) by mouth daily. Start taking on: September 28, 2020   pravastatin 40 MG tablet Commonly known as: PRAVACHOL Take 1 tablet (40 mg total) by mouth daily.   VITAMIN D PO Take 2 capsules by mouth daily.   ZINC CITRATE PO Take 1 tablet by mouth daily at 6 (six)  AM.       Follow-up Information    Home, Medi Follow up.   Why: The home health agency will contact you for the first home visit Contact information: [address] [place] [address] [phone]        [facility] In 4 weeks.   Specialty: Vascular Surgery Why: Office will call you to arrange your appt (sent) Contact information: [address] [place] [address] [phone]       Corwin Levins, MD. Schedule an appointment as soon as possible for a visit in 1 week(s).   Specialties: Internal Medicine, Radiology Contact information: [address] [place] [address] [phone]        Micki Riley, MD Follow up.   Specialties: Neurology, Radiology Why: office will call-if you do not hear from them-please call them in a week or so Contact information: [address] [place] [address] [phone]              No Known Allergies    Other Procedures/Studies: CT ANGIO HEAD W OR WO CONTRAST  Result Date: 09/23/2020 CLINICAL DATA:  Stroke/TIA, assess intracranial arteries EXAM: CT ANGIOGRAPHY HEAD AND NECK TECHNIQUE: Multidetector CT imaging of the head and neck was performed using the standard protocol during bolus administration of intravenous contrast. Multiplanar CT image reconstructions and MIPs were obtained to evaluate the vascular anatomy. Carotid stenosis measurements (when applicable) are obtained utilizing NASCET criteria, using the distal internal carotid diameter as the denominator. CONTRAST:  80mL OMNIPAQUE IOHEXOL 350 MG/ML SOLN COMPARISON:  09/23/2020 and prior. FINDINGS: CT HEAD FINDINGS Brain: Scattered small acute infarcts involving the right cerebrum, basal ganglia and anterior corpus callosum are better demonstrated on same day MRI. No mass lesion. No midline shift, ventriculomegaly or extra-axial fluid collection. Mild cerebral atrophy with ex vacuo dilatation and chronic  microvascular ischemic changes. Chronic left corona radiata and right basal ganglia lacunar insults. Vascular: No hyperdense vessel or unexpected calcification. Skull: Negative for fracture or focal lesion. Sinuses/Orbits: Normal orbits. Small left sphenoid sinus mucous retention cyst. No mastoid effusion. Other: None. Review of the MIP images confirms the above findings CTA NECK FINDINGS Aortic arch: Standard branching. Patent great vessel origins. No evidence of dissection. Aortic arch atheromatous disease with plaque ulceration along the inferior transverse segment. Right carotid system: Patent CCA. Bifurcation atheromatous disease with at least 90% proximal ICA narrowing. Left carotid system: Patent. Bifurcation atheromatous disease with less than 50% proximal ICA narrowing. Vertebral arteries: Dominant left vertebral artery.  Patent. Skeleton: No acute finding. Chronic left rib deformity. Multilevel spondylosis. Other neck: No adenopathy.  No soft tissue mass. Upper chest: Upper lung atelectasis. Review of the MIP images confirms the  above findings CTA HEAD FINDINGS Anterior circulation: Bilateral carotid siphon atherosclerotic calcifications with mild right supraclinoid ICA narrowing. Patent ACAs and MCAs. Posterior circulation: Mild-to-moderate right V4 segment narrowing. Patent basilar and superior cerebellar arteries. Patent PICA. Patent bilateral PCAs with mild narrowing and irregularity on the left. Venous sinuses: As permitted by contrast timing, patent. Anatomic variants: None. Review of the MIP images confirms the above findings IMPRESSION: Head CT: Multifocal small right cerebrum infarcts are better demonstrated on same day MRI. Chronic right basal ganglia and left corona radiata lacunar insults. CTA neck: At least 90% proximal right ICA narrowing. Less than 50% proximal left ICA narrowing. CTA head: Mild to moderate right V4 segment narrowing. Mild right supraclinoid ICA and left PCA narrowing. These  results will be called to the ordering clinician or representative by the Radiologist Assistant, and communication documented in the PACS or [employer]. Electronically Signed   By: Stana Bunting M.D.   On: 09/23/2020 19:55   CT HEAD WO CONTRAST  Result Date: 09/21/2020 CLINICAL DATA:  Increasing weakness and altered mental status, status post fall. EXAM: CT HEAD WITHOUT CONTRAST TECHNIQUE: Contiguous axial images were obtained from the base of the skull through the vertex without intravenous contrast. COMPARISON:  May 16, 2006 FINDINGS: Brain: There is mild cerebral atrophy with widening of the extra-axial spaces and ventricular dilatation. There are areas of decreased attenuation within the white matter tracts of the supratentorial brain, consistent with microvascular disease changes. Vascular: No hyperdense vessel or unexpected calcification. Skull: Normal. Negative for fracture or focal lesion. Sinuses/Orbits: No acute finding. Other: None. IMPRESSION: 1. Generalized cerebral atrophy. 2. No acute intracranial abnormality. Electronically Signed   By: Aram Candela M.D.   On: 09/21/2020 01:35   CT ANGIO NECK W OR WO CONTRAST  Result Date: 09/23/2020 CLINICAL DATA:  Stroke/TIA, assess intracranial arteries EXAM: CT ANGIOGRAPHY HEAD AND NECK TECHNIQUE: Multidetector CT imaging of the head and neck was performed using the standard protocol during bolus administration of intravenous contrast. Multiplanar CT image reconstructions and MIPs were obtained to evaluate the vascular anatomy. Carotid stenosis measurements (when applicable) are obtained utilizing NASCET criteria, using the distal internal carotid diameter as the denominator. CONTRAST:  68mL OMNIPAQUE IOHEXOL 350 MG/ML SOLN COMPARISON:  09/23/2020 and prior. FINDINGS: CT HEAD FINDINGS Brain: Scattered small acute infarcts involving the right cerebrum, basal ganglia and anterior corpus callosum are better demonstrated on same day MRI.  No mass lesion. No midline shift, ventriculomegaly or extra-axial fluid collection. Mild cerebral atrophy with ex vacuo dilatation and chronic microvascular ischemic changes. Chronic left corona radiata and right basal ganglia lacunar insults. Vascular: No hyperdense vessel or unexpected calcification. Skull: Negative for fracture or focal lesion. Sinuses/Orbits: Normal orbits. Small left sphenoid sinus mucous retention cyst. No mastoid effusion. Other: None. Review of the MIP images confirms the above findings CTA NECK FINDINGS Aortic arch: Standard branching. Patent great vessel origins. No evidence of dissection. Aortic arch atheromatous disease with plaque ulceration along the inferior transverse segment. Right carotid system: Patent CCA. Bifurcation atheromatous disease with at least 90% proximal ICA narrowing. Left carotid system: Patent. Bifurcation atheromatous disease with less than 50% proximal ICA narrowing. Vertebral arteries: Dominant left vertebral artery.  Patent. Skeleton: No acute finding. Chronic left rib deformity. Multilevel spondylosis. Other neck: No adenopathy.  No soft tissue mass. Upper chest: Upper lung atelectasis. Review of the MIP images confirms the above findings CTA HEAD FINDINGS Anterior circulation: Bilateral carotid siphon atherosclerotic calcifications with mild right supraclinoid ICA narrowing. Patent  ACAs and MCAs. Posterior circulation: Mild-to-moderate right V4 segment narrowing. Patent basilar and superior cerebellar arteries. Patent PICA. Patent bilateral PCAs with mild narrowing and irregularity on the left. Venous sinuses: As permitted by contrast timing, patent. Anatomic variants: None. Review of the MIP images confirms the above findings IMPRESSION: Head CT: Multifocal small right cerebrum infarcts are better demonstrated on same day MRI. Chronic right basal ganglia and left corona radiata lacunar insults. CTA neck: At least 90% proximal right ICA narrowing. Less than  50% proximal left ICA narrowing. CTA head: Mild to moderate right V4 segment narrowing. Mild right supraclinoid ICA and left PCA narrowing. These results will be called to the ordering clinician or representative by the Radiologist Assistant, and communication documented in the PACS or [employer]. Electronically Signed   By: Stana Bunting M.D.   On: 09/23/2020 19:55   CT CERVICAL SPINE WO CONTRAST  Result Date: 09/21/2020 CLINICAL DATA:  Status post fall. EXAM: CT CERVICAL SPINE WITHOUT CONTRAST TECHNIQUE: Multidetector CT imaging of the cervical spine was performed without intravenous contrast. Multiplanar CT image reconstructions were also generated. COMPARISON:  None. FINDINGS: Alignment: Normal. Skull base and vertebrae: No acute fracture. No primary bone lesion or focal pathologic process. Soft tissues and spinal canal: No prevertebral fluid or swelling. No visible canal hematoma. Disc levels: Moderate severity endplate sclerosis is seen at the levels of C5-C6 and C6-C7. Moderate to marked severity intervertebral disc space narrowing is also seen at the levels of C5-C6 and C6-C7. Bilateral moderate severity multilevel facet joint hypertrophy is noted. Upper chest: Negative. Other: None. IMPRESSION: 1. Moderate to marked severity degenerative changes at the levels of C5-C6 and C6-C7. 2. No evidence of an acute fracture or subluxation. Electronically Signed   By: Aram Candela M.D.   On: 09/21/2020 01:38   MR BRAIN WO CONTRAST  Result Date: 09/23/2020 CLINICAL DATA:  Mental status change, unknown cause EXAM: MRI HEAD WITHOUT CONTRAST TECHNIQUE: Multiplanar, multiecho pulse sequences of the brain and surrounding structures were obtained without intravenous contrast. COMPARISON:  09/21/2020 and prior. FINDINGS: Brain: Scattered small DWI hyperintensities predominantly involving the right cerebral watershed territories, anterior corpus callosum and right basal ganglia. No intracranial  hemorrhage. No midline shift, ventriculomegaly or extra-axial fluid collection. No mass lesion. Mild cerebral atrophy with ex vacuo dilatation. Mild chronic microvascular ischemic changes. Chronic right basal ganglia and left corona radiata lacunar insults. Vascular: Major intracranial flow voids are preserved at the skull base. Skull and upper cervical spine: Normal marrow signal. Sinuses/Orbits: Sequela of bilateral lens replacement. Clear paranasal sinuses and mastoid air cells. Other: None. IMPRESSION: Scattered small subacute infarcts involving the right cerebrum, right basal ganglia and anterior corpus callosum. Consider CTA head and neck. Chronic right basal ganglia and left corona radiata lacunar insults. Mild chronic microvascular ischemic changes. These results were called by telephone at the time of interpretation on 09/23/2020 at 2:50 pm to provider [facility] , who verbally acknowledged these results. Electronically Signed   By: Stana Bunting M.D.   On: 09/23/2020 14:53   DG CHEST PORT 1 VIEW  Result Date: 09/23/2020 CLINICAL DATA:  80 year old male with history of confusion and shortness of breath. EXAM: PORTABLE CHEST 1 VIEW COMPARISON:  Chest x-ray 09/20/2020. FINDINGS: Lung volumes are low. No consolidative airspace disease. No pleural effusions. No pneumothorax. No pulmonary nodule or mass noted. Pulmonary vasculature and the cardiomediastinal silhouette are within normal limits. IMPRESSION: 1. Low lung volumes without radiographic evidence of acute cardiopulmonary disease. Electronically Signed  By: Trudie Reed M.D.   On: 09/23/2020 14:59   DG Chest Port 1 View  Result Date: 09/20/2020 CLINICAL DATA:  Weakness and confusion. EXAM: PORTABLE CHEST 1 VIEW COMPARISON:  February 22, 2014 FINDINGS: Mild areas of atelectasis and/or infiltrate are seen within the bilateral lung bases. There is no evidence of a pleural effusion or pneumothorax. The heart size and mediastinal contours are  within normal limits. The visualized skeletal structures are unremarkable. IMPRESSION: Mild bibasilar atelectasis and/or infiltrate. Electronically Signed   By: Aram Candela M.D.   On: 09/20/2020 23:38   ECHOCARDIOGRAM COMPLETE  Result Date: 09/24/2020    ECHOCARDIOGRAM REPORT   Patient Name:   TORRI LANGSTON Date of Exam: 09/24/2020 Medical Rec #:  [id number]       Height:       68.0 in Accession #:    [id number]      Weight:       199.1 lb Date of Birth:  26-Nov-1940       BSA:          2.040 m Patient Age:    79 years        BP:           154/87 mmHg Patient Gender: M               HR:           83 bpm. Exam Location:  Inpatient Procedure: 2D Echo, Cardiac Doppler, Color Doppler and Intracardiac            Opacification Agent Indications:     Stroke I63.9  History:         Patient has no prior history of Echocardiogram examinations.                  Risk Factors:Hypertension, Dyslipidemia and Current Smoker.  Sonographer:     Renella Cunas RDCS Referring Phys:  [id number] Kateri Mc LATIF [facility] Diagnosing Phys: Tobias Alexander MD  Sonographer Comments: Technically difficult study due to poor echo windows. IMPRESSIONS  1. Left ventricular ejection fraction, by estimation, is 60 to 65%. The left ventricle has normal function. The left ventricle has no regional wall motion abnormalities. Left ventricular diastolic parameters are consistent with Grade I diastolic dysfunction (impaired relaxation).  2. Right ventricular systolic function is normal. The right ventricular size is normal. There is normal pulmonary artery systolic pressure.  3. Left atrial size was mildly dilated.  4. The mitral valve is normal in structure. No evidence of mitral valve regurgitation. No evidence of mitral stenosis.  5. The aortic valve is normal in structure. Aortic valve regurgitation is not visualized. No aortic stenosis is present.  6. The inferior vena cava is normal in size with <50% respiratory variability, suggesting right atrial  pressure of 8 mmHg. Conclusion(s)/Recommendation(s): No intracardiac source of embolism detected on this transthoracic study. A transesophageal echocardiogram is recommended to exclude cardiac source of embolism if clinically indicated. FINDINGS  Left Ventricle: Left ventricular ejection fraction, by estimation, is 60 to 65%. The left ventricle has normal function. The left ventricle has no regional wall motion abnormalities. Definity contrast agent was given IV to delineate the left ventricular  endocardial borders. The left ventricular internal cavity size was normal in size. There is no left ventricular hypertrophy. Left ventricular diastolic parameters are consistent with Grade I diastolic dysfunction (impaired relaxation). Normal left ventricular filling pressure. Right Ventricle: The right ventricular size is normal. No increase in right ventricular wall thickness. Right  ventricular systolic function is normal. There is normal pulmonary artery systolic pressure. Left Atrium: Left atrial size was mildly dilated. Right Atrium: Right atrial size was normal in size. Pericardium: There is no evidence of pericardial effusion. Mitral Valve: The mitral valve is normal in structure. No evidence of mitral valve regurgitation. No evidence of mitral valve stenosis. Tricuspid Valve: The tricuspid valve is normal in structure. Tricuspid valve regurgitation is not demonstrated. No evidence of tricuspid stenosis. Aortic Valve: The aortic valve is normal in structure. Aortic valve regurgitation is not visualized. No aortic stenosis is present. Pulmonic Valve: The pulmonic valve was normal in structure. Pulmonic valve regurgitation is not visualized. No evidence of pulmonic stenosis. Aorta: The aortic root is normal in size and structure. Venous: The inferior vena cava is normal in size with less than 50% respiratory variability, suggesting right atrial pressure of 8 mmHg. IAS/Shunts: No atrial level shunt detected by color  flow Doppler.  LEFT VENTRICLE PLAX 2D LVIDd:         4.20 cm  Diastology LVIDs:         3.00 cm  LV e' medial:    6.24 cm/s LV PW:         0.80 cm  LV E/e' medial:  11.3 LV IVS:        0.80 cm  LV e' lateral:   6.81 cm/s LVOT diam:     2.20 cm  LV E/e' lateral: 10.3 LV SV:         41 LV SV Index:   20 LVOT Area:     3.80 cm  RIGHT VENTRICLE RV S prime:     10.60 cm/s TAPSE (M-mode): 1.2 cm LEFT ATRIUM           Index       RIGHT ATRIUM           Index LA diam:      4.50 cm 2.21 cm/m  RA Area:     14.80 cm LA Vol (A2C): 36.3 ml 17.80 ml/m RA Volume:   38.50 ml  18.88 ml/m LA Vol (A4C): 45.3 ml 22.21 ml/m  AORTIC VALVE LVOT Vmax:   58.70 cm/s LVOT Vmean:  42.400 cm/s LVOT VTI:    0.108 m  AORTA Ao Root diam: 3.40 cm MITRAL VALVE MV Area (PHT): 4.15 cm    SHUNTS MV Decel Time: 183 msec    Systemic VTI:  0.11 m MV E velocity: 70.30 cm/s  Systemic Diam: 2.20 cm MV A velocity: 69.00 cm/s MV E/A ratio:  1.02 Tobias Alexander MD Electronically signed by Tobias Alexander MD Signature Date/Time: 09/24/2020/10:37:33 AM    Final (Updated)    VAS US CAROTID  Result Date: 09/24/2020 Carotid Arterial Duplex Study Indications:       CVA and 90% ICA narrowing by CTA of neck 09/23/20. Risk Factors:      Hypertension, Diabetes. Other Factors:     ETOH abuse. Comparison Study:  No prior study on file Performing Technologist: Sherren Kerns RVS  Examination Guidelines: A complete evaluation includes B-mode imaging, spectral Doppler, color Doppler, and power Doppler as needed of all accessible portions of each vessel. Bilateral testing is considered an integral part of a complete examination. Limited examinations for reoccurring indications may be performed as noted.  Right Carotid Findings: +----------+--------+--------+--------+-----------------+----------------------+           PSV cm/sEDV cm/sStenosisPlaque           Comments  Description                              +----------+--------+--------+--------+-----------------+----------------------+ CCA Prox  197     62                                                      +----------+--------+--------+--------+-----------------+----------------------+ CCA Distal77      25                               intimal thickening     +----------+--------+--------+--------+-----------------+----------------------+ ICA Prox  395     163     80-99%  heterogenous     thrombus versus plaque +----------+--------+--------+--------+-----------------+----------------------+ ICA Mid   88      20                                                      +----------+--------+--------+--------+-----------------+----------------------+ ICA Distal54      24                                                      +----------+--------+--------+--------+-----------------+----------------------+ ECA       145     26              heterogenous                            +----------+--------+--------+--------+-----------------+----------------------+ +----------+--------+-------+--------+-------------------+           PSV cm/sEDV cmsDescribeArm Pressure (mmHG) +----------+--------+-------+--------+-------------------+ [id number]                                         +----------+--------+-------+--------+-------------------+ +---------+--------+--+--------+-+ VertebralPSV cm/s29EDV cm/s6 +---------+--------+--+--------+-+  Left Carotid Findings: +----------+--------+--------+--------+------------------+--------+           PSV cm/sEDV cm/sStenosisPlaque DescriptionComments +----------+--------+--------+--------+------------------+--------+ CCA Prox  75      17                                         +----------+--------+--------+--------+------------------+--------+ CCA Distal68      16                                          +----------+--------+--------+--------+------------------+--------+ ICA Prox  52      14      1-39%   heterogenous               +----------+--------+--------+--------+------------------+--------+ ICA Distal57      21                                         +----------+--------+--------+--------+------------------+--------+  ECA       102     14              heterogenous               +----------+--------+--------+--------+------------------+--------+ +----------+--------+--------+--------+-------------------+           PSV cm/sEDV cm/sDescribeArm Pressure (mmHG) +----------+--------+--------+--------+-------------------+ Subclavian109                                         +----------+--------+--------+--------+-------------------+ +---------+--------+--+--------+--+ VertebralPSV cm/s55EDV cm/s16 +---------+--------+--+--------+--+   Summary: Right Carotid: Velocities in the right ICA are consistent with a 80-99%                stenosis. Left Carotid: Velocities in the left ICA are consistent with a 1-39% stenosis. Vertebrals:  Bilateral vertebral arteries demonstrate antegrade flow. Subclavians: Normal flow hemodynamics were seen in bilateral subclavian              arteries. *See table(s) above for measurements and observations.  Electronically signed by Delia HeadyPramod Sethi MD on 09/24/2020 at 1:45:21 PM.    Final    Structural Heart Procedure  Result Date: 09/26/2020 See surgical note for result.  HYBRID OR IMAGING ([facility] ONLY)  Result Date: 09/26/2020 There is no interpretation for this exam.  This order is for images obtained during a surgical procedure.  Please See "Surgeries" Tab for more information regarding the procedure.     TODAY-DAY OF DISCHARGE:  Subjective:   Vincent RidingJames Campbell today has no headache,no chest abdominal pain,no new weakness tingling or numbness, feels much better wants to go home today.   Objective:   Blood pressure 109/71, pulse 82, temperature  97.6 F (36.4 C), temperature source Oral, resp. rate 18, height 5\' 8"  (1.727 m), weight 90.3 kg, SpO2 91 %.  Intake/Output Summary (Last 24 hours) at 09/27/2020 1036 Last data filed at 09/27/2020 0811 Gross per 24 hour  Intake 1704.52 ml  Output 285 ml  Net 1419.52 ml   Filed Weights   09/20/20 2306 09/26/20 1349  Weight: 90.3 kg 90.3 kg    Exam: Awake Alert, Oriented *3, No new F.N deficits, Normal affect [place].AT,PERRAL Supple Neck,No JVD, No cervical lymphadenopathy appriciated.  Symmetrical Chest wall movement, Good air movement bilaterally, CTAB RRR,No Gallops,Rubs or new Murmurs, No Parasternal Heave +ve B.Sounds, Abd Soft, Non tender, No organomegaly appriciated, No rebound -guarding or rigidity. No Cyanosis, Clubbing or edema, No new Rash or bruise   PERTINENT RADIOLOGIC STUDIES: Structural Heart Procedure  Result Date: 09/26/2020 See surgical note for result.  HYBRID OR IMAGING ([facility] ONLY)  Result Date: 09/26/2020 There is no interpretation for this exam.  This order is for images obtained during a surgical procedure.  Please See "Surgeries" Tab for more information regarding the procedure.     PERTINENT LAB RESULTS: CBC: Recent Labs    09/26/20 0330 09/27/20 0605  WBC 11.3* 13.4*  HGB 13.1 12.9*  HCT 39.4 38.0*  PLT 261 282   CMET CMP     Component Value Date/Time   NA 136 09/27/2020 0605   K 4.4 09/27/2020 0605   CL 103 09/27/2020 0605   CO2 22 09/27/2020 0605   GLUCOSE 156 (H) 09/27/2020 0605   BUN 11 09/27/2020 0605   CREATININE 0.93 09/27/2020 0605   CALCIUM 8.4 (L) 09/27/2020 0605   PROT 6.3 (L) 09/27/2020 0605   ALBUMIN 3.3 (L) 09/27/2020 [id number]  AST 22 09/27/2020 0605   ALT 21 09/27/2020 0605   ALKPHOS 60 09/27/2020 0605   BILITOT 1.0 09/27/2020 0605   GFRNONAA >60 09/27/2020 0605   GFRAA 66 (L) 11/28/2013 1238    GFR Estimated Creatinine Clearance: 70.3 mL/min (by C-G formula based on SCr of 0.93 mg/dL). No results for input(s): LIPASE,  AMYLASE in the last 72 hours. No results for input(s): CKTOTAL, CKMB, CKMBINDEX, TROPONINI in the last 72 hours. Invalid input(s): POCBNP No results for input(s): DDIMER in the last 72 hours. No results for input(s): HGBA1C in the last 72 hours. No results for input(s): CHOL, HDL, LDLCALC, TRIG, CHOLHDL, LDLDIRECT in the last 72 hours. No results for input(s): TSH, T4TOTAL, T3FREE, THYROIDAB in the last 72 hours.  Invalid input(s): FREET3 No results for input(s): VITAMINB12, FOLATE, FERRITIN, TIBC, IRON, RETICCTPCT in the last 72 hours. Coags: No results for input(s): INR in the last 72 hours.  Invalid input(s): PT Microbiology: Recent Results (from the past 240 hour(s))  Blood culture (routine single)     Status: None   Collection Time: 09/20/20 11:25 PM   Specimen: BLOOD  Result Value Ref Range Status   Specimen Description   Final    BLOOD SPECIMEN SOURCE NOT MARKED ON REQUISITION Performed at [facility], [address] [place], [place] [address]    Special Requests   Final    BOTTLES DRAWN AEROBIC AND ANAEROBIC Blood Culture results may not be optimal due to an inadequate volume of blood received in culture bottles Performed at [facility], [address], [place], [place] [address]    Culture   Final    NO GROWTH 5 DAYS Performed at [facility], [address], [place], [place] [address]    Report Status 09/26/2020 FINAL  Final  Resp Panel by RT-PCR (Flu A&B, Covid) Nasopharyngeal Swab     Status: None   Collection Time: 09/20/20 11:57 PM   Specimen: Nasopharyngeal Swab; Nasopharyngeal(NP) swabs in vial transport medium  Result Value Ref Range Status   SARS Coronavirus 2 by RT PCR NEGATIVE NEGATIVE Final    Comment: (NOTE) SARS-CoV-2 target nucleic acids are NOT DETECTED.  The SARS-CoV-2 RNA is generally detectable in upper respiratory specimens during the acute phase of infection. The lowest concentration of SARS-CoV-2  viral copies this assay can detect is 138 copies/mL. A negative result does not preclude SARS-Cov-2 infection and should not be used as the sole basis for treatment or other patient management decisions. A negative result may occur with  improper specimen collection/handling, submission of specimen other than nasopharyngeal swab, presence of viral mutation(s) within the areas targeted by this assay, and inadequate number of viral copies(<138 copies/mL). A negative result must be combined with clinical observations, patient history, and epidemiological information. The expected result is Negative.  Fact Sheet for Patients:  BloggerCourse.com  Fact Sheet for Healthcare Providers:  SeriousBroker.it  This test is no t yet approved or cleared by the [place] FDA and  has been authorized for detection and/or diagnosis of SARS-CoV-2 by FDA under an Emergency Use Authorization (EUA). This EUA will remain  in effect (meaning this test can be used) for the duration of the COVID-19 declaration under Section 564(b)(1) of the Act, 21 U.S.C.section 360bbb-3(b)(1), unless the authorization is terminated  or revoked sooner.       Influenza A by PCR NEGATIVE NEGATIVE Final   Influenza B by PCR NEGATIVE NEGATIVE Final    Comment: (NOTE) The  Xpert Xpress SARS-CoV-2/FLU/RSV plus assay is intended as an aid in the diagnosis of influenza from Nasopharyngeal swab specimens and should not be used as a sole basis for treatment. Nasal washings and aspirates are unacceptable for Xpert Xpress SARS-CoV-2/FLU/RSV testing.  Fact Sheet for Patients: BloggerCourse.com  Fact Sheet for Healthcare Providers: SeriousBroker.it  This test is not yet approved or cleared by the [place] FDA and has been authorized for detection and/or diagnosis of SARS-CoV-2 by FDA under an Emergency Use Authorization  (EUA). This EUA will remain in effect (meaning this test can be used) for the duration of the COVID-19 declaration under Section 564(b)(1) of the Act, 21 U.S.C. section 360bbb-3(b)(1), unless the authorization is terminated or revoked.  Performed at [facility], [address] [place], [place] [address]   Urine culture     Status: None   Collection Time: 09/21/20  2:00 AM   Specimen: In/Out Cath Urine  Result Value Ref Range Status   Specimen Description   Final    IN/OUT CATH URINE Performed at [facility], [address] [place], [place] [address]    Special Requests   Final    NONE Performed at [facility], [address], [place], [place] [address]    Culture   Final    NO GROWTH Performed at [facility], [address], [place], [place] [address]    Report Status 09/22/2020 FINAL  Final  Culture, blood (routine x 2)     Status: None (Preliminary result)   Collection Time: 09/23/20  3:33 PM   Specimen: BLOOD RIGHT HAND  Result Value Ref Range Status   Specimen Description   Final    BLOOD RIGHT HAND Performed at [facility], [address] [place] [address], [place], [place] [address]    Special Requests   Final    BOTTLES DRAWN AEROBIC ONLY Blood Culture adequate volume Performed at [facility], [address], [place], [place] [address]    Culture   Final    NO GROWTH 3 DAYS Performed at [facility], [address], [place], [place] [address]    Report Status PENDING  Incomplete  Culture, blood (routine x 2)     Status: None (Preliminary result)   Collection Time: 09/23/20  3:38 PM   Specimen: BLOOD LEFT HAND  Result Value Ref Range Status   Specimen Description   Final    BLOOD LEFT HAND Performed at [facility], [address], [place], [place] [address]    Special Requests   Final    BOTTLES DRAWN AEROBIC AND ANAEROBIC Blood Culture  adequate volume Performed at [facility], [address] [place], [place] [address]    Culture   Final    NO GROWTH 3 DAYS Performed at [facility], [address] [place], [place] [address]    Report Status PENDING  Incomplete    FURTHER DISCHARGE INSTRUCTIONS:  Get Medicines reviewed and adjusted: Please take all your medications with you for your next visit with your Primary MD  Laboratory/radiological data: Please request your Primary MD to go over all hospital tests and procedure/radiological results at the follow up, please ask your Primary MD to get all Hospital records sent to his/her office.  In some cases, they will be blood work, cultures and biopsy results pending at the time of your discharge. Please request that your primary care M.D. goes through all  the records of your hospital data and follows up on these results.  Also Note the following: If you experience worsening of your admission symptoms, develop shortness of breath, life threatening emergency, suicidal or homicidal thoughts you must seek medical attention immediately by calling 911 or calling your MD immediately  if symptoms less severe.  You must read complete instructions/literature along with all the possible adverse reactions/side effects for all the Medicines you take and that have been prescribed to you. Take any new Medicines after you have completely understood and accpet all the possible adverse reactions/side effects.   Do not drive when taking Pain medications or sleeping medications (Benzodaizepines)  Do not take more than prescribed Pain, Sleep and Anxiety Medications. It is not advisable to combine anxiety,sleep and pain medications without talking with your primary care practitioner  Special Instructions: If you have smoked or chewed Tobacco  in the last 2 yrs please stop smoking, stop any regular Alcohol  and or any Recreational drug use.  Wear Seat belts while  driving.  Please note: You were cared for by a hospitalist during your hospital stay. Once you are discharged, your primary care physician will handle any further medical issues. Please note that NO REFILLS for any discharge medications will be authorized once you are discharged, as it is imperative that you return to your primary care physician (or establish a relationship with a primary care physician if you do not have one) for your post hospital discharge needs so that they can reassess your need for medications and monitor your lab values.  Total Time spent coordinating discharge including counseling, education and face to face time equals 35 minutes.  SignedJeoffrey Massed 09/27/2020 10:36 AM

## 2020-09-27 NOTE — Progress Notes (Signed)
Occupational Therapy Treatment Patient Details Name: Vincent Campbell MRN: [id number] DOB: [birth year] Today's Date: 09/27/2020    History of present illness 80 y.o. male presenting with lethargy and AMS. Patient admitted with acute encephalopathy and suspicion for PNA. MRI (+) scattered small subacute infarcts involving the right cerebrum, right basal ganglia and anterior corpus callosum. CT (+) at least 90% proximal right ICA narrowing and less than 50% proximal left ICA narrowing. Further work-up pending. 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 Needed restraints overnight 3/2 due to agitation.   OT comments  Patient with significantly improved mentation this date, spouse states he is back to baseline.  Able to complete stand grooming and sit/stand dressing with minimal setup and safety cues.  He is discharging today.  HH has been recommended.  No further acute OT needs.    Follow Up Recommendations  Home health OT;Supervision - Intermittent    Equipment Recommendations  Tub/shower bench    Recommendations for Other Services      Precautions / Restrictions Precautions Precautions: Fall Precaution Comments: High fall risk, very HOH (has hearing amplifier) Restrictions Weight Bearing Restrictions: No       Mobility Bed Mobility Overal bed mobility: Modified Independent             General bed mobility comments: received up in chair    Transfers Overall transfer level: Modified independent Equipment used: Rolling walker (2 wheeled) Transfers: Sit to/from Stand Sit to Stand: Supervision         General transfer comment: good hand placement; impulsive to stand prior to staff placing RW in front of him but listens to cues to sit back downa dn wait and also cues for safety with line mgmt    Balance Overall balance assessment: Mild deficits observed, not formally  tested Sitting-balance support: Feet supported;No upper extremity supported Sitting balance-Leahy Scale: Good Sitting balance - Comments: static sitting without difficulty   Standing balance support: During functional activity Standing balance-Leahy Scale: Fair Standing balance comment: standing with no AD able to don mask and pivot to chair without LOB and Supervision; with gait lightly reliant on RW                           ADL either performed or assessed with clinical judgement   ADL Overall ADL's : Modified independent                                       General ADL Comments: patient able to dress himself and perfrom gooming standing prior to returning home.     Vision Baseline Vision/History: Cataracts;Wears glasses Patient Visual Report: No change from baseline     Perception     Praxis      Cognition Arousal/Alertness: Awake/alert Behavior During Therapy: WFL for tasks assessed/performed Overall Cognitive Status: Within Functional Limits for tasks assessed Area of Impairment: Safety/judgement                         Safety/Judgement: Decreased awareness of safety     General Comments: decreased attention to L side and decreased safety awareness, impulsive to initiate mobility and decreased line awareness        Exercises     Shoulder Instructions  General Comments      Pertinent Vitals/ Pain       Pain Assessment: No/denies pain Faces Pain Scale: Hurts little more Pain Location: "all over" pt unable to localize, generalized discomfort from bed (likely back soreness and incisional discomfort?) Pain Descriptors / Indicators: Discomfort;Grimacing Pain Intervention(s): Monitored during session;Repositioned  Home Living                                          Prior Functioning/Environment              Frequency  Min 2X/week        Progress Toward Goals  OT Goals(current goals  can now be found in the care plan section)  Progress towards OT goals: Progressing toward goals  Acute Rehab OT Goals Patient Stated Goal: To return home with wife. OT Goal Formulation: With family Time For Goal Achievement: 10/04/20 Potential to Achieve Goals: Good  Plan Discharge plan remains appropriate    Co-evaluation                 AM-PAC OT "6 Clicks" Daily Activity     Outcome Measure   Help from another person eating meals?: None Help from another person taking care of personal grooming?: None Help from another person toileting, which includes using toliet, bedpan, or urinal?: None Help from another person bathing (including washing, rinsing, drying)?: None Help from another person to put on and taking off regular upper body clothing?: None Help from another person to put on and taking off regular lower body clothing?: None 6 Click Score: 24    End of Session    OT Visit Diagnosis: Unsteadiness on feet (R26.81)   Activity Tolerance Patient tolerated treatment well   Patient Left in chair;with call bell/phone within reach;with family/visitor present   Nurse Communication Mobility status        Time: 1115-1141 OT Time Calculation (min): 26 min  Charges: OT General Charges $OT Visit: 1 Visit OT Treatments $Self Care/Home Management : 23-37 mins  09/27/2020  [name], OTR/L  Acute Rehabilitation Services  Office:  [phone]    [name] 09/27/2020, 11:49 AM

## 2020-09-27 NOTE — Progress Notes (Signed)
Patient noted to only have 100cc of urine output over 8 hours, bladder scan indicates 255cc post void. Will continue to monitor.

## 2020-09-27 NOTE — Telephone Encounter (Signed)
Verbals given  

## 2020-09-27 NOTE — Anesthesia Postprocedure Evaluation (Signed)
Anesthesia Post Note  Patient: Vincent Campbell  Procedure(s) Performed: RIGHT TRANSCAROTID ARTERY REVASCULARIZATION (Right )     Patient location during evaluation: PACU Anesthesia Type: General Level of consciousness: awake and alert Pain management: pain level controlled Vital Signs Assessment: post-procedure vital signs reviewed and stable Respiratory status: spontaneous breathing, nonlabored ventilation, respiratory function stable and patient connected to nasal cannula oxygen Cardiovascular status: blood pressure returned to baseline and stable Postop Assessment: no apparent nausea or vomiting Anesthetic complications: yes Comments: Emergence delirium with combativeness towards staff. Patient on CIWA protocol possible withdrawal. Patient given precedex and ativan in PACU for symptom resolution and stabilization.    No complications documented.  Last Vitals:  Vitals:   09/26/20 2331 09/27/20 0655  BP:  (!) 143/78  Pulse:  79  Resp:  18  Temp: 36.6 C 36.5 C  SpO2:  96%    Last Pain:  Vitals:   09/27/20 0655  TempSrc: Axillary  PainSc:                  Nelle Don Dorreen Valiente

## 2020-09-27 NOTE — Telephone Encounter (Signed)
[facility] calling, they got orders from [facility] for home health PT and OT and they are wondering if Dr. Jonny Ruiz is willing to do orders for that

## 2020-09-27 NOTE — Progress Notes (Signed)
Patient now in 4 point restraints and treated with ordered haldol, patient is now calm however this RN observed the patient become agitated when NT attempts to take vital signs and obtain blood sugar reading. We will attempt to obtain vital signs and blood sugar at a later time and will continue to monitor.

## 2020-09-28 LAB — CULTURE, BLOOD (ROUTINE X 2)
Culture: NO GROWTH
Culture: NO GROWTH
Special Requests: ADEQUATE
Special Requests: ADEQUATE

## 2020-10-01 DIAGNOSIS — G9340 Encephalopathy, unspecified: Secondary | ICD-10-CM | POA: Diagnosis not present

## 2020-10-01 DIAGNOSIS — Z95828 Presence of other vascular implants and grafts: Secondary | ICD-10-CM | POA: Diagnosis not present

## 2020-10-01 DIAGNOSIS — E1122 Type 2 diabetes mellitus with diabetic chronic kidney disease: Secondary | ICD-10-CM | POA: Diagnosis not present

## 2020-10-01 DIAGNOSIS — I6521 Occlusion and stenosis of right carotid artery: Secondary | ICD-10-CM | POA: Diagnosis not present

## 2020-10-01 DIAGNOSIS — N182 Chronic kidney disease, stage 2 (mild): Secondary | ICD-10-CM | POA: Diagnosis not present

## 2020-10-01 DIAGNOSIS — Z8673 Personal history of transient ischemic attack (TIA), and cerebral infarction without residual deficits: Secondary | ICD-10-CM | POA: Diagnosis not present

## 2020-10-01 DIAGNOSIS — M545 Low back pain, unspecified: Secondary | ICD-10-CM | POA: Diagnosis not present

## 2020-10-01 DIAGNOSIS — E039 Hypothyroidism, unspecified: Secondary | ICD-10-CM | POA: Diagnosis not present

## 2020-10-01 DIAGNOSIS — F17210 Nicotine dependence, cigarettes, uncomplicated: Secondary | ICD-10-CM | POA: Diagnosis not present

## 2020-10-01 DIAGNOSIS — Z48812 Encounter for surgical aftercare following surgery on the circulatory system: Secondary | ICD-10-CM | POA: Diagnosis not present

## 2020-10-01 DIAGNOSIS — F1010 Alcohol abuse, uncomplicated: Secondary | ICD-10-CM | POA: Diagnosis not present

## 2020-10-01 DIAGNOSIS — I129 Hypertensive chronic kidney disease with stage 1 through stage 4 chronic kidney disease, or unspecified chronic kidney disease: Secondary | ICD-10-CM | POA: Diagnosis not present

## 2020-10-01 DIAGNOSIS — R251 Tremor, unspecified: Secondary | ICD-10-CM | POA: Diagnosis not present

## 2020-10-01 LAB — VITAMIN B1: Vitamin B1 (Thiamine): 159 nmol/L (ref 66.5–200.0)

## 2020-10-05 NOTE — Telephone Encounter (Signed)
Ok for verbals 

## 2020-10-05 NOTE — Telephone Encounter (Signed)
Orders given to radovan

## 2020-10-05 NOTE — Telephone Encounter (Signed)
   HH ORDERS   Caller Name: [facility] Agency Name: [facility] Callback Phone #: [phone] Service Requested: OT Frequency of Visits: [phone]

## 2020-10-11 ENCOUNTER — Telehealth: Payer: Self-pay | Admitting: Internal Medicine

## 2020-10-11 NOTE — Telephone Encounter (Signed)
Radovan w/ [facility] is requesting verbals for a visit next week sine the patient will miss a visit this week due to equipment that he has not received yet. Please advise    Phone:[phone], okay to LVM

## 2020-10-11 NOTE — Telephone Encounter (Signed)
Ok for verbals 

## 2020-10-11 NOTE — Telephone Encounter (Signed)
Verbal order given for "a visit next week sine the patient will miss a visit this week due to equipment that he has not received yet."  Radovan verb understanding.

## 2020-10-15 ENCOUNTER — Telehealth: Payer: Self-pay | Admitting: Internal Medicine

## 2020-10-15 ENCOUNTER — Other Ambulatory Visit: Payer: Self-pay

## 2020-10-15 ENCOUNTER — Other Ambulatory Visit: Payer: Self-pay | Admitting: Internal Medicine

## 2020-10-15 MED ORDER — LOSARTAN POTASSIUM 25 MG PO TABS: 100.0000 mg | ORAL_TABLET | Freq: Every day | ORAL | 0 refills | Status: DC

## 2020-10-15 NOTE — Telephone Encounter (Signed)
Please refill as per office routine med refill policy (all routine meds refilled for 3 mo or monthly per pt preference up to one year from last visit, then month to month grace period for 3 mo, then further med refills will have to be denied)  

## 2020-10-15 NOTE — Telephone Encounter (Signed)
1.Medication Requested: losartan (COZAAR) 25 MG tablet    2. Pharmacy (Name, Street, [place]): [id number] - [place], [place] - [address]  3. On Med List: yes   4. Last Visit with PCP: 11.18.21  5. Next visit date with PCP: 3.23.22   Agent: Please be advised that RX refills may take up to 3 business days. We ask that you follow-up with your pharmacy.

## 2020-10-17 ENCOUNTER — Ambulatory Visit (INDEPENDENT_AMBULATORY_CARE_PROVIDER_SITE_OTHER): Payer: Medicare Other | Admitting: Internal Medicine

## 2020-10-17 ENCOUNTER — Encounter: Payer: Self-pay | Admitting: Internal Medicine

## 2020-10-17 ENCOUNTER — Other Ambulatory Visit: Payer: Self-pay

## 2020-10-17 VITALS — BP 116/72 | HR 84 | Temp 97.9°F | Ht 68.0 in | Wt 201.0 lb

## 2020-10-17 DIAGNOSIS — N182 Chronic kidney disease, stage 2 (mild): Secondary | ICD-10-CM

## 2020-10-17 DIAGNOSIS — H903 Sensorineural hearing loss, bilateral: Secondary | ICD-10-CM

## 2020-10-17 DIAGNOSIS — L989 Disorder of the skin and subcutaneous tissue, unspecified: Secondary | ICD-10-CM

## 2020-10-17 DIAGNOSIS — I10 Essential (primary) hypertension: Secondary | ICD-10-CM

## 2020-10-17 DIAGNOSIS — I6330 Cerebral infarction due to thrombosis of unspecified cerebral artery: Secondary | ICD-10-CM

## 2020-10-17 DIAGNOSIS — R609 Edema, unspecified: Secondary | ICD-10-CM

## 2020-10-17 DIAGNOSIS — J189 Pneumonia, unspecified organism: Secondary | ICD-10-CM

## 2020-10-17 DIAGNOSIS — Z8673 Personal history of transient ischemic attack (TIA), and cerebral infarction without residual deficits: Secondary | ICD-10-CM | POA: Diagnosis not present

## 2020-10-17 DIAGNOSIS — E1122 Type 2 diabetes mellitus with diabetic chronic kidney disease: Secondary | ICD-10-CM | POA: Diagnosis not present

## 2020-10-17 MED ORDER — FUROSEMIDE 40 MG PO TABS: 40.0000 mg | ORAL_TABLET | Freq: Every day | ORAL | 0 refills | Status: DC

## 2020-10-17 NOTE — Progress Notes (Signed)
Patient ID: Vincent Campbell, male   DOB: [birth year], 80 y.o.   MRN: [id number]         Chief Complaint:: post hospital discharge       HPI:  Vincent Campbell is a 80 y.o. male here for wellness exam; due for eye exam and plans to make his own appt soon. O/w up to date with preventive referrals and immunizations.                          Also now s/p hospn 2/24 to mar 3 with AMS and ambulatory d/o, with evaluation c/w acute CVA - Scattered infarcts-right cerebrum/right basal ganglia/anterior corpus callosum.  Other evaluation is c/w : 2/28>>carotid Doppler: Right ICA-80-99% stenosis, left ICA 1-29% stenosis. 2/27>>CTA neck: 90% narrowing of right ICA, 50% proximal left ICA narrowing 2/27>>LDL: 64 2/27>>A1c: 7.0 2/28>>Echo: EF 60-65%, grade 1 diastolic dysfunction Due to right ICA stenosis-has slight left upper extremity weakness on exam-vascular surgery performed right carotid stenting.  Pt felt okay to discharge on antiplatelet agents and statin and protonix  Patient asked to follow with neurology, vascular surgery as instructed by them.  Mild AKI resolved with IVFs,  Pt was dc with HH and PT, now close to completing.  Has ongoing hearing loss, to see audiology mar 28.  Also noted today a slightly raised fleshy non tender lesion at the temple new in past 2 mo.  Also had mild worsening LE edema after IVF during procedure.  Pt denies chest pain, increased sob or doe, wheezing, orthopnea, PND, palpitations, dizziness or syncope.  Pt denies new worsening focal neuro s/s.   Pt denies polydipsia, polyuria, Overall good compliance with meds.   Pt denies fever, wt loss, night sweats, loss of appetite, or other constitutional symptoms    Wt Readings from Last 3 Encounters:  10/17/20 201 lb (91.2 kg)  09/26/20 199 lb 1.2 oz (90.3 kg)  06/14/20 199 lb (90.3 kg)   BP Readings from Last 3 Encounters:  10/17/20 116/72  09/27/20 109/71  06/14/20 (!) 170/60   Immunization History  Administered Date(s)  Administered  . Fluad Quad(high Dose 65+) 03/16/2019, 06/14/2020  . Influenza, High Dose Seasonal PF 06/26/2016, 07/13/2017, 09/15/2018  . Influenza,inj,Quad PF,6+ Mos 05/30/2015  . PFIZER(Purple Top)SARS-COV-2 Vaccination 10/27/2019, 11/28/2019  . Pneumococcal Conjugate-13 02/03/2013, 08/11/2013  . Pneumococcal Polysaccharide-23 07/28/2012  . Td 07/28/2001  . Tetanus 02/03/2013   There are no preventive care reminders to display for this patient.    Past Medical History:  Diagnosis Date  . BACK PAIN 01/12/2008  . COLONIC POLYPS, HX OF 01/12/2008  . Cough 05/28/2010  . Encounter for well adult exam with abnormal findings 12/29/2010  . HYPERLIPIDEMIA 01/12/2008  . HYPERTENSION 01/12/2008  . Hypothyroidism 09/15/2018  . LEUKOCYTOSIS 03/02/2008  . PERIPHERAL EDEMA 06/28/2010  . SWELLING MASS OR LUMP IN HEAD AND NECK 05/28/2010  . Tremor 08/02/2012   Past Surgical History:  Procedure Laterality Date  . APPENDECTOMY    . CATARACT EXTRACTION    . CHOLECYSTECTOMY    . TRANSCAROTID ARTERY REVASCULARIZATION Right 09/26/2020   Procedure: RIGHT TRANSCAROTID ARTERY REVASCULARIZATION;  Surgeon: Maeola Harman, MD;  Location: [facility] OR;  Service: Vascular;  Laterality: Right;    reports that he quit smoking about 6 months ago. He has never used smokeless tobacco. He reports current alcohol use. He reports that he does not use drugs. family history includes Cancer in an other family member; Heart disease in  an other family member; Hypertension in an other family member. No Known Allergies Current Outpatient Medications on File Prior to Visit  Medication Sig Dispense Refill  . aspirin 81 MG tablet Take 81 mg by mouth daily.    . clopidogrel (PLAVIX) 75 MG tablet Take 1 tablet (75 mg total) by mouth daily. 30 tablet 1  . EUTHYROX 25 MCG tablet TAKE 1 TABLET BY MOUTH ONCE DAILY BEFORE BREAKFAST 90 tablet 0  . Glucosamine 500 MG CAPS Take 500 mg by mouth daily.    . hydrochlorothiazide (MICROZIDE)  12.5 MG capsule Take 1 capsule by mouth once daily 90 capsule 0  . losartan (COZAAR) 25 MG tablet Take 4 tablets (100 mg total) by mouth daily. 360 tablet 0  . metFORMIN (GLUCOPHAGE-XR) 500 MG 24 hr tablet Take 3 tablets (1,500 mg total) by mouth daily with breakfast. 270 tablet 3  . pantoprazole (PROTONIX) 40 MG tablet Take 1 tablet (40 mg total) by mouth daily. 30 tablet 0  . pravastatin (PRAVACHOL) 40 MG tablet Take 1 tablet (40 mg total) by mouth daily. 90 tablet 1  . VITAMIN D PO Take 2 capsules by mouth daily.    Marland Kitchen ZINC CITRATE PO Take 1 tablet by mouth daily at 6 (six) AM.     No current facility-administered medications on file prior to visit.        ROS:  All others reviewed and negative.  Objective        PE:  BP 116/72   Pulse 84   Temp 97.9 F (36.6 C) (Oral)   Ht 5\' 8"  (1.727 m)   Wt 201 lb (91.2 kg)   SpO2 95%   BMI 30.56 kg/m                 Constitutional: Pt appears in NAD               HENT: Head: NCAT.                Right Ear: External ear normal.                 Left Ear: External ear normal.                Eyes: . Pupils are equal, round, and reactive to light. Conjunctivae and EOM are normal               Nose: without d/c or deformity               Neck: Neck supple. Gross normal ROM               Cardiovascular: Normal rate and regular rhythm.                 Pulmonary/Chest: Effort normal and breath sounds without rales or wheezing.                Abd:  Soft, NT, ND, + BS, no organomegaly               Neurological: Pt is alert. At baseline orientation, motor grossly intact               Skin:  LE edema - 1+ bilat LE swelling to mid legs, also slightly raised fleshy non tender lesion at the temple                Psychiatric: Pt behavior is normal without agitation   Micro: none  Cardiac tracings I  have personally interpreted today:  none  Pertinent Radiological findings (summarize): none   Lab Results  Component Value Date   WBC 13.4 (H)  09/27/2020   HGB 12.9 (L) 09/27/2020   HCT 38.0 (L) 09/27/2020   PLT 282 09/27/2020   GLUCOSE 156 (H) 09/27/2020   CHOL 134 09/23/2020   TRIG 71 09/23/2020   HDL 56 09/23/2020   LDLDIRECT 116.7 05/28/2010   LDLCALC 64 09/23/2020   ALT 21 09/27/2020   AST 22 09/27/2020   NA 136 09/27/2020   K 4.4 09/27/2020   CL 103 09/27/2020   CREATININE 0.93 09/27/2020   BUN 11 09/27/2020   CO2 22 09/27/2020   TSH 3.326 09/21/2020   PSA 2.70 09/20/2019   INR 1.3 (H) 09/20/2020   HGBA1C 7.0 (H) 09/23/2020   MICROALBUR <0.7 09/20/2019   Assessment/Plan:  Vincent Campbell is a 80 y.o. White or Caucasian [1] male with  has a past medical history of BACK PAIN (01/12/2008), COLONIC POLYPS, HX OF (01/12/2008), Cough (05/28/2010), Encounter for well adult exam with abnormal findings (12/29/2010), HYPERLIPIDEMIA (01/12/2008), HYPERTENSION (01/12/2008), Hypothyroidism (09/15/2018), LEUKOCYTOSIS (03/02/2008), PERIPHERAL EDEMA (06/28/2010), SWELLING MASS OR LUMP IN HEAD AND NECK (05/28/2010), and Tremor (08/02/2012).  History of ischemic stroke Stable now s/p right carotid stenting, to continue current med tx - plavixa and asa, f/u neurology and vascular as planned   Current Outpatient Medications (Endocrine & Metabolic):  Marland Kitchen  EUTHYROX 25 MCG tablet, TAKE 1 TABLET BY MOUTH ONCE DAILY BEFORE BREAKFAST .  metFORMIN (GLUCOPHAGE-XR) 500 MG 24 hr tablet, Take 3 tablets (1,500 mg total) by mouth daily with breakfast.  Current Outpatient Medications (Cardiovascular):  .  furosemide (LASIX) 40 MG tablet, Take 1 tablet (40 mg total) by mouth daily for 4 days. .  hydrochlorothiazide (MICROZIDE) 12.5 MG capsule, Take 1 capsule by mouth once daily .  losartan (COZAAR) 25 MG tablet, Take 4 tablets (100 mg total) by mouth daily. .  pravastatin (PRAVACHOL) 40 MG tablet, Take 1 tablet (40 mg total) by mouth daily.   Current Outpatient Medications (Analgesics):  .  aspirin 81 MG tablet, Take 81 mg by mouth daily.  Current  Outpatient Medications (Hematological):  .  clopidogrel (PLAVIX) 75 MG tablet, Take 1 tablet (75 mg total) by mouth daily.  Current Outpatient Medications (Other):  Marland Kitchen  Glucosamine 500 MG CAPS, Take 500 mg by mouth daily. .  pantoprazole (PROTONIX) 40 MG tablet, Take 1 tablet (40 mg total) by mouth daily. Marland Kitchen  VITAMIN D PO, Take 2 capsules by mouth daily. Marland Kitchen  ZINC CITRATE PO, Take 1 tablet by mouth daily at 6 (six) AM.   PERIPHERAL EDEMA Mild for lasix 40 qd x 4 days only  Essential hypertension BP Readings from Last 3 Encounters:  10/17/20 116/72  09/27/20 109/71  06/14/20 (!) 170/60   Stable, pt to continue medical treatment - hct, losartan   Diabetes (HCC) Lab Results  Component Value Date   HGBA1C 7.0 (H) 09/23/2020   Stable, pt to continue current medical treatment metformin   CKD (chronic kidney disease), stage II Lab Results  Component Value Date   CREATININE 0.93 09/27/2020   Stable overall, cont to avoid nephrotoxins   Skin lesion of face Also for derm referral - r/o skin cancer  Followup: Return in about 4 weeks (around 11/14/2020).  Oliver Barre, MD 10/22/2020 12:51 AM [facility] - [facility]

## 2020-10-17 NOTE — Patient Instructions (Signed)
Please take all new medication as prescribed - the lasix each day for the next 4 days  Please continue all other medications as before, and refills have been done if requested.  Please have the pharmacy call with any other refills you may need.  Please continue your efforts at being more active, low cholesterol diet, and weight control.  Please keep your appointments with your specialists as you may have planned - audiology mar 28  You will be contacted regarding the referral for: dermatology for the right temple skin lesion  Please make an Appointment to return in 1 month

## 2020-10-18 ENCOUNTER — Telehealth: Payer: Self-pay | Admitting: Internal Medicine

## 2020-10-18 NOTE — Telephone Encounter (Signed)
Radovan from [facility] calling to report patient discharged from OT services; met goal  Phone [phone]

## 2020-10-22 ENCOUNTER — Other Ambulatory Visit: Payer: Self-pay

## 2020-10-22 ENCOUNTER — Encounter: Payer: Self-pay | Admitting: Internal Medicine

## 2020-10-22 DIAGNOSIS — G9340 Encephalopathy, unspecified: Secondary | ICD-10-CM

## 2020-10-22 DIAGNOSIS — H903 Sensorineural hearing loss, bilateral: Secondary | ICD-10-CM | POA: Diagnosis not present

## 2020-10-22 DIAGNOSIS — L989 Disorder of the skin and subcutaneous tissue, unspecified: Secondary | ICD-10-CM | POA: Insufficient documentation

## 2020-10-22 NOTE — Assessment & Plan Note (Signed)
Stable now s/p right carotid stenting, to continue current med tx - plavixa and asa, f/u neurology and vascular as planned   Current Outpatient Medications (Endocrine & Metabolic):  Marland Kitchen  EUTHYROX 25 MCG tablet, TAKE 1 TABLET BY MOUTH ONCE DAILY BEFORE BREAKFAST .  metFORMIN (GLUCOPHAGE-XR) 500 MG 24 hr tablet, Take 3 tablets (1,500 mg total) by mouth daily with breakfast.  Current Outpatient Medications (Cardiovascular):  .  furosemide (LASIX) 40 MG tablet, Take 1 tablet (40 mg total) by mouth daily for 4 days. .  hydrochlorothiazide (MICROZIDE) 12.5 MG capsule, Take 1 capsule by mouth once daily .  losartan (COZAAR) 25 MG tablet, Take 4 tablets (100 mg total) by mouth daily. .  pravastatin (PRAVACHOL) 40 MG tablet, Take 1 tablet (40 mg total) by mouth daily.   Current Outpatient Medications (Analgesics):  .  aspirin 81 MG tablet, Take 81 mg by mouth daily.  Current Outpatient Medications (Hematological):  .  clopidogrel (PLAVIX) 75 MG tablet, Take 1 tablet (75 mg total) by mouth daily.  Current Outpatient Medications (Other):  Marland Kitchen  Glucosamine 500 MG CAPS, Take 500 mg by mouth daily. .  pantoprazole (PROTONIX) 40 MG tablet, Take 1 tablet (40 mg total) by mouth daily. Marland Kitchen  VITAMIN D PO, Take 2 capsules by mouth daily. Marland Kitchen  ZINC CITRATE PO, Take 1 tablet by mouth daily at 6 (six) AM.

## 2020-10-22 NOTE — Assessment & Plan Note (Signed)
Lab Results  Component Value Date   HGBA1C 7.0 (H) 09/23/2020   Stable, pt to continue current medical treatment metformin  

## 2020-10-22 NOTE — Assessment & Plan Note (Signed)
BP Readings from Last 3 Encounters:  10/17/20 116/72  09/27/20 109/71  06/14/20 (!) 170/60   Stable, pt to continue medical treatment - hct, losartan

## 2020-10-22 NOTE — Assessment & Plan Note (Signed)
Lab Results  Component Value Date   CREATININE 0.93 09/27/2020   Stable overall, cont to avoid nephrotoxins

## 2020-10-22 NOTE — Assessment & Plan Note (Signed)
Mild for lasix 40 qd x 4 days only

## 2020-10-22 NOTE — Assessment & Plan Note (Signed)
Also for derm referral - r/o skin cancer

## 2020-10-23 ENCOUNTER — Telehealth: Payer: Self-pay | Admitting: Internal Medicine

## 2020-10-23 MED ORDER — PANTOPRAZOLE SODIUM 40 MG PO TBEC: 40.0000 mg | DELAYED_RELEASE_TABLET | Freq: Every day | ORAL | 0 refills | Status: DC

## 2020-10-23 MED ORDER — CLOPIDOGREL BISULFATE 75 MG PO TABS: 75.0000 mg | ORAL_TABLET | Freq: Every day | ORAL | 1 refills | Status: DC

## 2020-10-23 NOTE — Telephone Encounter (Signed)
1.Medication Requested:pantoprazole (PROTONIX) 40 MG tablet   clopidogrel (PLAVIX) 75 MG tablet    2. Pharmacy (Name, Street, [place]): [id number] - , [place] - [address]  3. On Med List: yes   4. Last Visit with PCP: 10/17/20  5. Next visit date with PCP: 11/21/20   Agent: Please be advised that RX refills may take up to 3 business days. We ask that you follow-up with your pharmacy.

## 2020-10-29 ENCOUNTER — Other Ambulatory Visit: Payer: Self-pay

## 2020-10-29 ENCOUNTER — Encounter: Payer: Self-pay | Admitting: Physician Assistant

## 2020-10-29 ENCOUNTER — Ambulatory Visit (HOSPITAL_COMMUNITY)
Admission: RE | Admit: 2020-10-29 | Discharge: 2020-10-29 | Disposition: A | Discharge status: Home / Self Care | Payer: Medicare (Managed Care) | Source: Ambulatory Visit | Attending: Surgery | Admitting: Surgery

## 2020-10-29 ENCOUNTER — Ambulatory Visit (INDEPENDENT_AMBULATORY_CARE_PROVIDER_SITE_OTHER): Payer: Medicare Other | Admitting: Physician Assistant

## 2020-10-29 VITALS — BP 108/74 | HR 87 | Temp 98.5°F | Resp 20 | Ht 68.0 in | Wt 203.2 lb

## 2020-10-29 DIAGNOSIS — I6522 Occlusion and stenosis of left carotid artery: Secondary | ICD-10-CM | POA: Diagnosis not present

## 2020-10-29 DIAGNOSIS — G9340 Encephalopathy, unspecified: Secondary | ICD-10-CM | POA: Insufficient documentation

## 2020-10-29 DIAGNOSIS — I6521 Occlusion and stenosis of right carotid artery: Secondary | ICD-10-CM

## 2020-10-29 DIAGNOSIS — I10 Essential (primary) hypertension: Secondary | ICD-10-CM | POA: Insufficient documentation

## 2020-10-29 DIAGNOSIS — I2510 Atherosclerotic heart disease of native coronary artery without angina pectoris: Secondary | ICD-10-CM | POA: Diagnosis not present

## 2020-10-29 DIAGNOSIS — E119 Type 2 diabetes mellitus without complications: Secondary | ICD-10-CM | POA: Diagnosis not present

## 2020-10-29 NOTE — Progress Notes (Signed)
POST OPERATIVE OFFICE NOTE    CC:  F/u for surgery  HPI:  This is a 80 y.o. male who is s/p right TCAR on 09/26/20 by Dr. Donzetta Matters.  For symptomatic ICA stenosis on the right.  Pt returns today for follow up.  Pt states He denise facial droop, amaurosis, aphasia or new weakness in his extremities.    FYI: He states that he has seen every president in person back to [employer] (who he met) with the exception of [employer].    He was in the [employer].  The pt is on a statin for cholesterol management.  The pt is on a daily aspirin,, and Plavix.    The pt is on ARB, BB for hypertension.   The pt is diabetic.   Tobacco hx:  Former-quit   No Known Allergies  Current Outpatient Medications  Medication Sig Dispense Refill  . aspirin 81 MG tablet Take 81 mg by mouth daily.    . clopidogrel (PLAVIX) 75 MG tablet Take 1 tablet (75 mg total) by mouth daily. 30 tablet 1  . EUTHYROX 25 MCG tablet TAKE 1 TABLET BY MOUTH ONCE DAILY BEFORE BREAKFAST 90 tablet 0  . Glucosamine 500 MG CAPS Take 500 mg by mouth daily.    . hydrochlorothiazide (MICROZIDE) 12.5 MG capsule Take 1 capsule by mouth once daily 90 capsule 0  . losartan (COZAAR) 25 MG tablet Take 4 tablets (100 mg total) by mouth daily. 360 tablet 0  . metFORMIN (GLUCOPHAGE-XR) 500 MG 24 hr tablet Take 3 tablets (1,500 mg total) by mouth daily with breakfast. 270 tablet 3  . pantoprazole (PROTONIX) 40 MG tablet Take 1 tablet (40 mg total) by mouth daily. 30 tablet 0  . pravastatin (PRAVACHOL) 40 MG tablet Take 1 tablet (40 mg total) by mouth daily. 90 tablet 1  . VITAMIN D PO Take 2 capsules by mouth daily.    Marland Kitchen ZINC CITRATE PO Take 1 tablet by mouth daily at 6 (six) AM.    . furosemide (LASIX) 40 MG tablet Take 1 tablet (40 mg total) by mouth daily for 4 days. 4 tablet 0   No current facility-administered medications for this visit.     ROS:  See HPI  Physical Exam:    Right Carotid Findings:   +----------+--------+--------+--------+------------------+--------+       PSV cm/sEDV cm/sStenosisPlaque DescriptionComments  +----------+--------+--------+--------+------------------+--------+  CCA Prox 114   15                      +----------+--------+--------+--------+------------------+--------+  CCA Mid  93   18                      +----------+--------+--------+--------+------------------+--------+  CCA Distal83   21                      +----------+--------+--------+--------+------------------+--------+  ICA Prox         Normal          stent    +----------+--------+--------+--------+------------------+--------+  ICA Mid                       stent    +----------+--------+--------+--------+------------------+--------+  ICA Distal56   15                      +----------+--------+--------+--------+------------------+--------+  ECA    128   18                      +----------+--------+--------+--------+------------------+--------+   +----------+--------+-------+----------------+-------------------+  PSV cm/sEDV cmsDescribe    Arm Pressure (mmHG)  +----------+--------+-------+----------------+-------------------+  [id number]   7   Multiphasic, WNL            +----------+--------+-------+----------------+-------------------+   +---------+--------+--+--------+-+---------+  VertebralPSV cm/s36EDV cm/s8Antegrade  +---------+--------+--+--------+-+---------+      Right Stent(s):  +---------------+--+--++++  Prox to Stent 8321  +---------------+--+--++++  Proximal Stent 7919  +---------------+--+--++++  Mid Stent   3911  +---------------+--+--++++  Distal Stent  4513   +---------------+--+--++++  Distal to [id number]  +---------------+--+--++++          Left Carotid Findings:  +----------+--------+--------+--------+------------------+--------+       PSV cm/sEDV cm/sStenosisPlaque DescriptionComments  +----------+--------+--------+--------+------------------+--------+  CCA Prox 117   24                      +----------+--------+--------+--------+------------------+--------+  CCA Mid  87   19                      +----------+--------+--------+--------+------------------+--------+  CCA Distal84   20       heterogenous         +----------+--------+--------+--------+------------------+--------+  ICA Prox 51   17   1-39%  heterogenous         +----------+--------+--------+--------+------------------+--------+  ICA Mid  81   23                      +----------+--------+--------+--------+------------------+--------+  ICA Distal62   20                      +----------+--------+--------+--------+------------------+--------+  ECA    109   15       heterogenous         +----------+--------+--------+--------+------------------+--------+   +----------+--------+--------+----------------+-------------------+       PSV cm/sEDV cm/sDescribe    Arm Pressure (mmHG)  +----------+--------+--------+----------------+-------------------+  [id number]   3    Multiphasic, WNL            +----------+--------+--------+----------------+-------------------+   +---------+--------+--+--------+--+---------+  VertebralPSV cm/s53EDV cm/s15Antegrade  +---------+--------+--+--------+--+---------+    Summary:  Right Carotid: There is no evidence of stenosis in the right ICA. Patent  stent.   Left Carotid: Velocities in  the left ICA are consistent with a 1-39%  stenosis.   Vertebrals: Bilateral vertebral arteries demonstrate antegrade flow.  Subclavians: Normal flow hemodynamics were seen in bilateral subclavian        arteries.   Incision:  Well healed, no facial droop or tongue deviation on exam Extremities:  B LE edema, brisk doppler PT, intact monophasic DP/peroneal.  telangectasia's around the ankle/feet.  No skin discoloration.  No evidence of ischemia or non healing wounds. Lungs: non labored breathing    Assessment/Plan:  This is a 80 y.o. male who is s/p: Right TCAR His stent is patent without stenosis and the left ICA is < 39% stenosis. Stable post op disposition without neurologic deficits.  He mentioned B ankle edema his primary Dr. Jenny Reichmann prescribed lasix for 4 tablets.  I have added ABI studies and carotid duplex for his next visit.  If he cont. To have edema he may benefit drom mild compression.  He does have spider veins around his ankles and may have a combination of venous insuffiencey as well.     Roxy Horseman PA-C [facility] [phone]   Clinic MD:  Trula Slade

## 2020-10-30 ENCOUNTER — Other Ambulatory Visit: Payer: Self-pay

## 2020-10-30 DIAGNOSIS — R609 Edema, unspecified: Secondary | ICD-10-CM

## 2020-10-30 DIAGNOSIS — I6521 Occlusion and stenosis of right carotid artery: Secondary | ICD-10-CM

## 2020-11-06 ENCOUNTER — Telehealth: Payer: Self-pay | Admitting: Internal Medicine

## 2020-11-06 NOTE — Telephone Encounter (Signed)
pantoprazole (PROTONIX) 40 MG tablet [facility] - [place], [place] - [address] Phone:  [phone]  Fax:  [phone]     Last seen- 03.23.22 Next apt- 04.27.22

## 2020-11-07 ENCOUNTER — Other Ambulatory Visit: Payer: Self-pay

## 2020-11-07 MED ORDER — PANTOPRAZOLE SODIUM 40 MG PO TBEC: 40.0000 mg | DELAYED_RELEASE_TABLET | Freq: Every day | ORAL | 0 refills | Status: DC

## 2020-11-12 ENCOUNTER — Telehealth: Payer: Self-pay | Admitting: Internal Medicine

## 2020-11-12 MED ORDER — CLOPIDOGREL BISULFATE 75 MG PO TABS: 75.0000 mg | ORAL_TABLET | Freq: Every day | ORAL | 1 refills | Status: DC

## 2020-11-12 MED ORDER — PANTOPRAZOLE SODIUM 40 MG PO TBEC: 40.0000 mg | DELAYED_RELEASE_TABLET | Freq: Every day | ORAL | 3 refills | Status: DC

## 2020-11-12 NOTE — Telephone Encounter (Signed)
clopidogrel (PLAVIX) 75 MG tablet Patients wife calling, wondering if he still needs to take this and if so if he can get a refill.  pantoprazole (PROTONIX) 40 MG tablet Pharmacy states they never received this medication.  [facility] [address], [place] - [address] Phone:  [phone]  Fax:  [phone]

## 2020-11-12 NOTE — Telephone Encounter (Signed)
Yes the plavix should be contd at least through the may 22 appt with neurology  I also sent the [facility]ix [facility] to [facility]

## 2020-11-21 ENCOUNTER — Encounter: Payer: Self-pay | Admitting: Internal Medicine

## 2020-11-21 ENCOUNTER — Other Ambulatory Visit: Payer: Self-pay

## 2020-11-21 ENCOUNTER — Ambulatory Visit (INDEPENDENT_AMBULATORY_CARE_PROVIDER_SITE_OTHER): Payer: Medicare (Managed Care) | Admitting: Internal Medicine

## 2020-11-21 VITALS — BP 120/76 | HR 64 | Temp 97.7°F | Ht 68.0 in | Wt 206.0 lb

## 2020-11-21 DIAGNOSIS — Z0001 Encounter for general adult medical examination with abnormal findings: Secondary | ICD-10-CM

## 2020-11-21 DIAGNOSIS — N182 Chronic kidney disease, stage 2 (mild): Secondary | ICD-10-CM

## 2020-11-21 DIAGNOSIS — E039 Hypothyroidism, unspecified: Secondary | ICD-10-CM | POA: Diagnosis not present

## 2020-11-21 DIAGNOSIS — E785 Hyperlipidemia, unspecified: Secondary | ICD-10-CM | POA: Diagnosis not present

## 2020-11-21 DIAGNOSIS — E1122 Type 2 diabetes mellitus with diabetic chronic kidney disease: Secondary | ICD-10-CM

## 2020-11-21 DIAGNOSIS — I10 Essential (primary) hypertension: Secondary | ICD-10-CM | POA: Diagnosis not present

## 2020-11-21 DIAGNOSIS — R609 Edema, unspecified: Secondary | ICD-10-CM | POA: Diagnosis not present

## 2020-11-21 MED ORDER — FUROSEMIDE 40 MG PO TABS: 40.0000 mg | ORAL_TABLET | ORAL | 3 refills | Status: DC

## 2020-11-21 NOTE — Patient Instructions (Signed)
Please take all new medication as prescribed  - the lasix fluid pill every other day  Please continue all other medications as before, and refills have been done if requested.  Please have the pharmacy call with any other refills you may need.  Please continue your efforts at being more active, low cholesterol diet, and weight control.  Please keep your appointments with your specialists as you may have planned  Please make an Appointment to return in 1 month

## 2020-11-21 NOTE — Progress Notes (Signed)
Patient ID: Vincent Campbell, male   DOB: [birth year], 80 y.o.   MRN: [id number]         Chief Complaint:: wellness exam and Follow-up  leg swelling, hypothyroidism, hld, htn, dm, ckd       HPI:  Vincent Campbell is a 80 y.o. male here for wellness exam; decliens eye exam referral for now o/w up to date with preventive referrals and immunizations                        Also Pt denies chest pain, increased sob or doe, wheezing, orthopnea, PND,  palpitations, dizziness or syncope, but has had mild increased bilat leg swelling associated with some wt increase as well.  Not taking lasix.   Pt denies polydipsia, polyuria, or new focal neuro s/s.  Denies hyper or hypo thyroid symptoms such as voice, skin or hair change.   Pt denies fever, wt loss, night sweats, loss of appetite, or other constitutional symptoms  No other new complaints    Wt Readings from Last 3 Encounters:  11/21/20 206 lb (93.4 kg)  10/29/20 203 lb 3.2 oz (92.2 kg)  10/17/20 201 lb (91.2 kg)   BP Readings from Last 3 Encounters:  11/21/20 120/76  10/29/20 108/74  10/17/20 116/72   Immunization History  Administered Date(s) Administered  . Fluad Quad(high Dose 65+) 03/16/2019, 06/14/2020  . Influenza, High Dose Seasonal PF 06/26/2016, 07/13/2017, 09/15/2018  . Influenza,inj,Quad PF,6+ Mos 05/30/2015  . PFIZER(Purple Top)SARS-COV-2 Vaccination 10/27/2019, 11/28/2019  . Pneumococcal Conjugate-13 02/03/2013, 08/11/2013  . Pneumococcal Polysaccharide-23 07/28/2012  . Td 07/28/2001  . Tetanus 02/03/2013  There are no preventive care reminders to display for this patient.    Past Medical History:  Diagnosis Date  . BACK PAIN 01/12/2008  . COLONIC POLYPS, HX OF 01/12/2008  . Cough 05/28/2010  . Encounter for well adult exam with abnormal findings 12/29/2010  . HYPERLIPIDEMIA 01/12/2008  . HYPERTENSION 01/12/2008  . Hypothyroidism 09/15/2018  . LEUKOCYTOSIS 03/02/2008  . PERIPHERAL EDEMA 06/28/2010  . SWELLING MASS OR LUMP IN HEAD  AND NECK 05/28/2010  . Tremor 08/02/2012   Past Surgical History:  Procedure Laterality Date  . APPENDECTOMY    . CATARACT EXTRACTION    . CHOLECYSTECTOMY    . TRANSCAROTID ARTERY REVASCULARIZATION Right 09/26/2020   Procedure: RIGHT TRANSCAROTID ARTERY REVASCULARIZATION;  Surgeon: Maeola Harman, MD;  Location: [facility] OR;  Service: Vascular;  Laterality: Right;    reports that he quit smoking about 7 months ago. He has never used smokeless tobacco. He reports current alcohol use. He reports that he does not use drugs. family history includes Cancer in an other family member; Heart disease in an other family member; Hypertension in an other family member. No Known Allergies Current Outpatient Medications on File Prior to Visit  Medication Sig Dispense Refill  . aspirin 81 MG tablet Take 81 mg by mouth daily.    . clopidogrel (PLAVIX) 75 MG tablet Take 1 tablet (75 mg total) by mouth daily. 30 tablet 1  . EUTHYROX 25 MCG tablet TAKE 1 TABLET BY MOUTH ONCE DAILY BEFORE BREAKFAST 90 tablet 0  . Glucosamine 500 MG CAPS Take 500 mg by mouth daily.    . hydrochlorothiazide (MICROZIDE) 12.5 MG capsule Take 1 capsule by mouth once daily 90 capsule 0  . losartan (COZAAR) 25 MG tablet Take 4 tablets (100 mg total) by mouth daily. 360 tablet 0  . metFORMIN (GLUCOPHAGE-XR) 500 MG 24 hr  tablet Take 3 tablets (1,500 mg total) by mouth daily with breakfast. 270 tablet 3  . pantoprazole (PROTONIX) 40 MG tablet Take 1 tablet (40 mg total) by mouth daily. 90 tablet 3  . VITAMIN D PO Take 2 capsules by mouth daily.    Marland Kitchen ZINC CITRATE PO Take 1 tablet by mouth daily at 6 (six) AM.     No current facility-administered medications on file prior to visit.        ROS:  All others reviewed and negative.  Objective        PE:  BP 120/76 (BP Location: Left Arm, Patient Position: Sitting, Cuff Size: Normal)   Pulse 64   Temp 97.7 F (36.5 C) (Oral)   Ht 5\' 8"  (1.727 m)   Wt 206 lb (93.4 kg)   SpO2 96%    BMI 31.32 kg/m                 Constitutional: Pt appears in NAD               HENT: Head: NCAT.                Right Ear: External ear normal.                 Left Ear: External ear normal.                Eyes: . Pupils are equal, round, and reactive to light. Conjunctivae and EOM are normal               Nose: without d/c or deformity               Neck: Neck supple. Gross normal ROM               Cardiovascular: Normal rate and regular rhythm.                 Pulmonary/Chest: Effort normal and breath sounds without rales or wheezing.                Abd:  Soft, NT, ND, + BS, no organomegaly               Neurological: Pt is alert. At baseline orientation, motor grossly intact               Skin: Skin is warm. No rashes, no other new lesions, LE edema - 1+ bilat               Psychiatric: Pt behavior is normal without agitation   Micro: none  Cardiac tracings I have personally interpreted today:  none  Pertinent Radiological findings (summarize): none   Lab Results  Component Value Date   WBC 13.4 (H) 09/27/2020   HGB 12.9 (L) 09/27/2020   HCT 38.0 (L) 09/27/2020   PLT 282 09/27/2020   GLUCOSE 156 (H) 09/27/2020   CHOL 134 09/23/2020   TRIG 71 09/23/2020   HDL 56 09/23/2020   LDLDIRECT 116.7 05/28/2010   LDLCALC 64 09/23/2020   ALT 21 09/27/2020   AST 22 09/27/2020   NA 136 09/27/2020   K 4.4 09/27/2020   CL 103 09/27/2020   CREATININE 0.93 09/27/2020   BUN 11 09/27/2020   CO2 22 09/27/2020   TSH 3.326 09/21/2020   PSA 2.70 09/20/2019   INR 1.3 (H) 09/20/2020   HGBA1C 7.0 (H) 09/23/2020   MICROALBUR <0.7 09/20/2019   Assessment/Plan:  Vincent Campbell is a 80 y.o.  White or Caucasian [1] male with  has a past medical history of BACK PAIN (01/12/2008), COLONIC POLYPS, HX OF (01/12/2008), Cough (05/28/2010), Encounter for well adult exam with abnormal findings (12/29/2010), HYPERLIPIDEMIA (01/12/2008), HYPERTENSION (01/12/2008), Hypothyroidism (09/15/2018), LEUKOCYTOSIS  (03/02/2008), PERIPHERAL EDEMA (06/28/2010), SWELLING MASS OR LUMP IN HEAD AND NECK (05/28/2010), and Tremor (08/02/2012).  Encounter for well adult exam with abnormal findings Age and sex appropriate education and counseling updated with regular exercise and diet Referrals for preventative services - declines eye exam referral Immunizations addressed - none needed Smoking counseling  - none needed Evidence for depression or other mood disorder - none significant Most recent labs reviewed. I have personally reviewed and have noted: 1) the patient's medical and social history 2) The patient's current medications and supplements 3) The patient's height, weight, and BMI have been recorded in the chart   PERIPHERAL EDEMA Ok to star lasix 40 mg on mon -wed -Friday and f/u next visit  Hypothyroidism Lab Results  Component Value Date   TSH 3.326 09/21/2020   Stable, pt to continue levothyroxine  Hyperlipidemia Lab Results  Component Value Date   LDLCALC 64 09/23/2020   Stable, pt to continue current statin pravachol 40   Essential hypertension BP Readings from Last 3 Encounters:  11/21/20 120/76  10/29/20 108/74  10/17/20 116/72   Stable, pt to continue medical treatment losartan   Diabetes (HCC) Lab Results  Component Value Date   HGBA1C 7.0 (H) 09/23/2020   Stable, pt to continue current medical treatment - metformin   CKD (chronic kidney disease), stage II Lab Results  Component Value Date   CREATININE 0.93 09/27/2020   Stable overall, cont to avoid nephrotoxins   Followup: Return in about 4 weeks (around 12/19/2020).  Oliver Barre, MD 11/25/2020 7:47 PM [facility]

## 2020-11-22 ENCOUNTER — Other Ambulatory Visit: Payer: Self-pay | Admitting: Internal Medicine

## 2020-11-22 NOTE — Telephone Encounter (Signed)
Please refill as per office routine med refill policy (all routine meds refilled for 3 mo or monthly per pt preference up to one year from last visit, then month to month grace period for 3 mo, then further med refills will have to be denied)  

## 2020-11-25 ENCOUNTER — Encounter: Payer: Self-pay | Admitting: Internal Medicine

## 2020-11-25 NOTE — Assessment & Plan Note (Signed)
Lab Results  Component Value Date   CREATININE 0.93 09/27/2020   Stable overall, cont to avoid nephrotoxins

## 2020-11-25 NOTE — Assessment & Plan Note (Signed)
Ok to star lasix 40 mg on mon -wed -Friday and f/u next visit

## 2020-11-25 NOTE — Assessment & Plan Note (Signed)
Lab Results  Component Value Date   LDLCALC 64 09/23/2020   Stable, pt to continue current statin pravachol 40

## 2020-11-25 NOTE — Assessment & Plan Note (Signed)
Lab Results  Component Value Date   TSH 3.326 09/21/2020   Stable, pt to continue levothyroxine  

## 2020-11-25 NOTE — Assessment & Plan Note (Signed)
Age and sex appropriate education and counseling updated with regular exercise and diet Referrals for preventative services - declines eye exam referral Immunizations addressed - none needed Smoking counseling  - none needed Evidence for depression or other mood disorder - none significant Most recent labs reviewed. I have personally reviewed and have noted: 1) the patient's medical and social history 2) The patient's current medications and supplements 3) The patient's height, weight, and BMI have been recorded in the chart  

## 2020-11-25 NOTE — Assessment & Plan Note (Addendum)
BP Readings from Last 3 Encounters:  11/21/20 120/76  10/29/20 108/74  10/17/20 116/72   Stable, pt to continue medical treatment losartan

## 2020-11-25 NOTE — Assessment & Plan Note (Signed)
Lab Results  Component Value Date   HGBA1C 7.0 (H) 09/23/2020   Stable, pt to continue current medical treatment - metformin

## 2020-12-12 ENCOUNTER — Encounter: Payer: Self-pay | Admitting: Neurology

## 2020-12-12 ENCOUNTER — Telehealth: Payer: Self-pay | Admitting: Internal Medicine

## 2020-12-12 ENCOUNTER — Ambulatory Visit (INDEPENDENT_AMBULATORY_CARE_PROVIDER_SITE_OTHER): Payer: Medicare (Managed Care) | Admitting: Neurology

## 2020-12-12 VITALS — BP 111/58 | HR 70 | Ht 66.5 in | Wt 206.4 lb

## 2020-12-12 DIAGNOSIS — I6389 Other cerebral infarction: Secondary | ICD-10-CM | POA: Diagnosis not present

## 2020-12-12 DIAGNOSIS — I6521 Occlusion and stenosis of right carotid artery: Secondary | ICD-10-CM

## 2020-12-12 NOTE — Telephone Encounter (Signed)
    Patient requesting refill for clopidogrel (PLAVIX) 75 MG tablet metFORMIN (GLUCOPHAGE-XR) 500 MG 24 hr tablet   Pharmacy [facility] [address], [place] - [address]

## 2020-12-12 NOTE — Patient Instructions (Signed)
I had a long d/w patient and his wife about his recent stroke, right carotid stenosis and stenting, risk for recurrent stroke/TIAs, personally independently reviewed imaging studies and stroke evaluation results and answered questions.Continue aspirin 81 mg and Plavix 75 mg daily given recent carotid stent for a few more months and then switch to aspirin alone daily  for secondary stroke prevention and maintain strict control of hypertension with blood pressure goal below 130/90, diabetes with hemoglobin A1c goal below 6.5% and lipids with LDL cholesterol goal below 70 mg/dL. I also advised the patient to eat a healthy diet with plenty of whole grains, cereals, fruits and vegetables, exercise regularly and maintain ideal body weight continue follow-up with vascular surgery for carotid stent surveillance.  Followup in the future with my nurse practitioner Shanda Bumps in 6 months or call earlier if necessary.  Stroke Prevention Some medical conditions and behaviors are associated with a higher chance of having a stroke. You can help prevent a stroke by making nutrition, lifestyle, and other changes, including managing any medical conditions you may have. What nutrition changes can be made?  Eat healthy foods. You can do this by: ? Choosing foods high in fiber, such as fresh fruits and vegetables and whole grains. ? Eating at least 5 or more servings of fruits and vegetables a day. Try to fill half of your plate at each meal with fruits and vegetables. ? Choosing lean protein foods, such as lean cuts of meat, poultry without skin, fish, tofu, beans, and nuts. ? Eating low-fat dairy products. ? Avoiding foods that are high in salt (sodium). This can help lower blood pressure. ? Avoiding foods that have saturated fat, trans fat, and cholesterol. This can help prevent high cholesterol. ? Avoiding processed and premade foods.  Follow your health care provider's specific guidelines for losing weight, controlling  high blood pressure (hypertension), lowering high cholesterol, and managing diabetes. These may include: ? Reducing your daily calorie intake. ? Limiting your daily sodium intake to 1,500 milligrams (mg). ? Using only healthy fats for cooking, such as olive oil, canola oil, or sunflower oil. ? Counting your daily carbohydrate intake.   What lifestyle changes can be made?  Maintain a healthy weight. Talk to your health care provider about your ideal weight.  Get at least 30 minutes of moderate physical activity at least 5 days a week. Moderate activity includes brisk walking, biking, and swimming.  Do not use any products that contain nicotine or tobacco, such as cigarettes and e-cigarettes. If you need help quitting, ask your health care provider. It may also be helpful to avoid exposure to secondhand smoke.  Limit alcohol intake to no more than 1 drink a day for nonpregnant women and 2 drinks a day for men. One drink equals 12 oz of beer, 5 oz of wine, or 1 oz of hard liquor.  Stop any illegal drug use.  Avoid taking birth control pills. Talk to your health care provider about the risks of taking birth control pills if: ? You are over 38 years old. ? You smoke. ? You get migraines. ? You have ever had a blood clot. What other changes can be made?  Manage your cholesterol levels. ? Eating a healthy diet is important for preventing high cholesterol. If cholesterol cannot be managed through diet alone, you may also need to take medicines. ? Take any prescribed medicines to control your cholesterol as told by your health care provider.  Manage your diabetes. ? Eating a healthy  diet and exercising regularly are important parts of managing your blood sugar. If your blood sugar cannot be managed through diet and exercise, you may need to take medicines. ? Take any prescribed medicines to control your diabetes as told by your health care provider.  Control your hypertension. ? To reduce  your risk of stroke, try to keep your blood pressure below 130/80. ? Eating a healthy diet and exercising regularly are an important part of controlling your blood pressure. If your blood pressure cannot be managed through diet and exercise, you may need to take medicines. ? Take any prescribed medicines to control hypertension as told by your health care provider. ? Ask your health care provider if you should monitor your blood pressure at home. ? Have your blood pressure checked every year, even if your blood pressure is normal. Blood pressure increases with age and some medical conditions.  Get evaluated for sleep disorders (sleep apnea). Talk to your health care provider about getting a sleep evaluation if you snore a lot or have excessive sleepiness.  Take over-the-counter and prescription medicines only as told by your health care provider. Aspirin or blood thinners (antiplatelets or anticoagulants) may be recommended to reduce your risk of forming blood clots that can lead to stroke.  Make sure that any other medical conditions you have, such as atrial fibrillation or atherosclerosis, are managed. What are the warning signs of a stroke? The warning signs of a stroke can be easily remembered as BEFAST.  B is for balance. Signs include: ? Dizziness. ? Loss of balance or coordination. ? Sudden trouble walking.  E is for eyes. Signs include: ? A sudden change in vision. ? Trouble seeing.  F is for face. Signs include: ? Sudden weakness or numbness of the face. ? The face or eyelid drooping to one side.  A is for arms. Signs include: ? Sudden weakness or numbness of the arm, usually on one side of the body.  S is for speech. Signs include: ? Trouble speaking (aphasia). ? Trouble understanding.  T is for time. ? These symptoms may represent a serious problem that is an emergency. Do not wait to see if the symptoms will go away. Get medical help right away. Call your local  emergency services (911 in the [place] Do not drive yourself to the hospital.  Other signs of stroke may include: ? A sudden, severe headache with no known cause. ? Nausea or vomiting. ? Seizure. Where to find more information For more information, visit:  American Stroke Association: www.strokeassociation.org  National Stroke Association: www.stroke.org Summary  You can prevent a stroke by eating healthy, exercising, not smoking, limiting alcohol intake, and managing any medical conditions you may have.  Do not use any products that contain nicotine or tobacco, such as cigarettes and e-cigarettes. If you need help quitting, ask your health care provider. It may also be helpful to avoid exposure to secondhand smoke.  Remember BEFAST for warning signs of stroke. Get help right away if you or a loved one has any of these signs. This information is not intended to replace advice given to you by your health care provider. Make sure you discuss any questions you have with your health care provider. Document Revised: 06/26/2017 Document Reviewed: 08/19/2016 Elsevier Patient Education  2021 [employer].

## 2020-12-12 NOTE — Progress Notes (Signed)
[facility] [address] [place]. [place] [address] [phone]       OFFICE FOLLOW-UP NOTE  [name] Date of Birth:  [birth year] Medical Record Number:  [id number]   HPI:.  Vincent Campbell is a 80 year old Caucasian male seen today for initial office follow-up visit following hospital admission for stroke in February 2022.  He is accompanied by his wife.  History is obtained from them and review of electronic medical records and I personally reviewed pertinent available imaging films in PACS.  He has past medical history of diabetes, hypertension, hyperlipidemia who presented to [facility] long ED on 09/21/2020 for evaluation for lethargy and confusion.  In the ED was worked up for decreased level of oxygen saturation which was found to be in the low 90s on room air.  White count is slightly elevated and chest x-ray showed bibasilar opacities.  However MRI scan was obtained which showed scattered small subacute infarcts involving the right cerebrum basal ganglia and anterior corpus callosum as well as chronic right basal ganglia and left corona radiator lacunar infarcts.  NIH stroke scale was 1.  CT angiogram showed 90% proximal right ICA stenosis and less than 50% proximal left ICA stenosis.  This was confirmed on the carotid ultrasound which showed 80 to 99% right ICA stenosis.  2D echo showed ejection fraction 60 to 65%.  LDL cholesterol 64 mg percent.  Hemoglobin A1c was 7.0.  Patient was seen by vascular surgery who did elective right carotid artery angioplasty and stenting with TCAR procedure which went well.  Patient was started on aspirin Plavix which is tolerating well with minor bruising and no bleeding.  He states his sugars have been quite well controlled at home.  He is tolerating Lipitor well without muscle aches and pains.  His blood pressure is quite good at home and today it is 111/58.  He still has a slight diminished fine motor skills in the left hand but otherwise  is doing well.  His main problem is his significantly impaired hearing despite his hearing aids which makes him quite frustrated.  He did have a follow-up carotid ultrasound done on 10/29/2020 which showed patent right internal carotid stent without significant restenosis.  ROS:   14 system review of systems is positive for weakness, imbalance and all other systems negative.  PMH:  Past Medical History:  Diagnosis Date  . BACK PAIN 01/12/2008  . COLONIC POLYPS, HX OF 01/12/2008  . Cough 05/28/2010  . Encounter for well adult exam with abnormal findings 12/29/2010  . HYPERLIPIDEMIA 01/12/2008  . HYPERTENSION 01/12/2008  . Hypothyroidism 09/15/2018  . LEUKOCYTOSIS 03/02/2008  . PERIPHERAL EDEMA 06/28/2010  . SWELLING MASS OR LUMP IN HEAD AND NECK 05/28/2010  . Tremor 08/02/2012    Social History:  Social History   Socioeconomic History  . Marital status: Married    Spouse name: Jola Babinski   . Number of children: Not on file  . Years of education: Not on file  . Highest education level: Not on file  Occupational History  . Occupation: OWNER    Employer: [employer]  . Occupation: former Facilities manager  . 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 Smoking status: Former Smoker    Quit date: 04/01/2020    Years since quitting: 0.6  . Smokeless tobacco: Never Used  Substance and Sexual Activity  . Alcohol use: Yes  . Drug use: No  . Sexual activity: Not  on file  Other Topics Concern  . Not on file  Social History Narrative   Lives in own home with wife   Left Handed   Drinks 3-4 cups caffeine daily         Social Determinants of Health   Financial Resource Strain: Not on file  Food Insecurity: Not on file  Transportation Needs: Not on file  Physical Activity: Not on file  Stress: Not on file  Social Connections: Not on file  Intimate Partner Violence: Not on file    Medications:   Current Outpatient Medications on File Prior to  Visit  Medication Sig Dispense Refill  . aspirin 81 MG tablet Take 81 mg by mouth daily.    . clopidogrel (PLAVIX) 75 MG tablet Take 1 tablet (75 mg total) by mouth daily. 30 tablet 1  . EUTHYROX 25 MCG tablet TAKE 1 TABLET BY MOUTH ONCE DAILY BEFORE BREAKFAST 90 tablet 0  . furosemide (LASIX) 40 MG tablet Take 1 tablet (40 mg total) by mouth every other day. 45 tablet 3  . Glucosamine 500 MG CAPS Take 500 mg by mouth daily.    . hydrochlorothiazide (MICROZIDE) 12.5 MG capsule Take 1 capsule by mouth once daily 90 capsule 0  . losartan (COZAAR) 25 MG tablet Take 4 tablets (100 mg total) by mouth daily. 360 tablet 0  . metFORMIN (GLUCOPHAGE-XR) 500 MG 24 hr tablet Take 3 tablets (1,500 mg total) by mouth daily with breakfast. 270 tablet 3  . pantoprazole (PROTONIX) 40 MG tablet Take 1 tablet (40 mg total) by mouth daily. 90 tablet 3  . pravastatin (PRAVACHOL) 40 MG tablet Take 1 tablet by mouth once daily 90 tablet 0  . VITAMIN D PO Take 2 capsules by mouth daily.    Marland Kitchen ZINC CITRATE PO Take 1 tablet by mouth daily at 6 (six) AM.     No current facility-administered medications on file prior to visit.    Allergies:  No Known Allergies  Physical Exam General: Obese elderly Caucasian male seated, in no evident distress Head: head normocephalic and atraumatic.  Neck: supple with no carotid or supraclavicular bruits Cardiovascular: regular rate and rhythm, no murmurs Musculoskeletal: no deformity Skin:  no rash/petichiae Vascular:  Normal pulses all extremities Vitals:   12/12/20 1145  BP: (!) 111/58  Pulse: 70   Neurologic Exam Mental Status: Awake and fully alert. Oriented to place and time. Recent and remote memory intact. Attention span, concentration and fund of knowledge appropriate. Mood and affect appropriate.  Cranial Nerves: Fundoscopic exam reveals sharp disc margins. Pupils equal, briskly reactive to light. Extraocular movements full without nystagmus. Visual fields full to  confrontation. Hearing severely diminished bilaterally despite hearing aids facial sensation intact.  Mild left lower facial asymmetry when he smiles., tongue, palate moves normally and symmetrically.  Motor: Normal bulk and tone. Normal strength in all tested extremity muscles except diminished fine finger movements on the left and mild weakness of left grip.  Orbits right over left upper extremity.. Sensory.: intact to touch ,pinprick .position and vibratory sensation.  Coordination: Rapid alternating movements normal in all extremities. Finger-to-nose and heel-to-shin performed accurately bilaterally. Gait and Station: Arises from chair without difficulty. Stance is broad-based. Gait demonstrates normal stride length but mild imbalance when he turns..  Not able to heel, toe and tandem walk .  Reflexes: 1+ and symmetric. Toes downgoing.   NIHSS 1 Modified Rankin  1   ASSESSMENT: 80 year old Caucasian male with symptomatic severe right carotid stenosis in  February 2022 who has been extremely well and is undergone interval right carotid TCAR revascularization procedure.  Vascular risk factors of diabetes, hypertension, hyperlipidemia , obesity and carotid disease     PLAN: I had a long d/w patient and his wife about his recent stroke, right carotid stenosis and stenting, risk for recurrent stroke/TIAs, personally independently reviewed imaging studies and stroke evaluation results and answered questions.Continue aspirin 81 mg and Plavix 75 mg daily given recent carotid stent for a few more months and then switch to aspirin alone daily  for secondary stroke prevention and maintain strict control of hypertension with blood pressure goal below 130/90, diabetes with hemoglobin A1c goal below 6.5% and lipids with LDL cholesterol goal below 70 mg/dL. I also advised the patient to eat a healthy diet with plenty of whole grains, cereals, fruits and vegetables, exercise regularly and maintain ideal body  weight continue follow-up with vascular surgery for carotid stent surveillance.  Followup in the future with my nurse practitioner Shanda Bumps in 6 months or call earlier if necessary. Greater than 50% of time during this 25 minute visit was spent on counseling,explanation of diagnosis of stroke and carotid stenosis, planning of further management, discussion with patient and family and coordination of care Delia Heady, MD Note: This document was prepared with digital dictation and possible smart phrase technology. Any transcriptional errors that result from this process are unintentional

## 2020-12-13 ENCOUNTER — Other Ambulatory Visit: Payer: Self-pay

## 2020-12-13 MED ORDER — CLOPIDOGREL BISULFATE 75 MG PO TABS: 75.0000 mg | ORAL_TABLET | Freq: Every day | ORAL | 1 refills | Status: DC

## 2020-12-13 MED ORDER — METFORMIN HCL ER 500 MG PO TB24: 1500.0000 mg | ORAL_TABLET | Freq: Every day | ORAL | 3 refills | Status: DC

## 2020-12-20 ENCOUNTER — Other Ambulatory Visit: Payer: Self-pay | Admitting: Internal Medicine

## 2020-12-20 ENCOUNTER — Other Ambulatory Visit: Payer: Self-pay

## 2020-12-20 NOTE — Telephone Encounter (Signed)
Please refill as per office routine med refill policy (all routine meds refilled for 3 mo or monthly per pt preference up to one year from last visit, then month to month grace period for 3 mo, then further med refills will have to be denied)  

## 2020-12-21 ENCOUNTER — Encounter: Payer: Self-pay | Admitting: Internal Medicine

## 2020-12-21 ENCOUNTER — Ambulatory Visit (INDEPENDENT_AMBULATORY_CARE_PROVIDER_SITE_OTHER): Payer: Medicare (Managed Care) | Admitting: Internal Medicine

## 2020-12-21 VITALS — BP 136/70 | HR 82 | Temp 97.8°F | Ht 66.5 in | Wt 205.0 lb

## 2020-12-21 DIAGNOSIS — I10 Essential (primary) hypertension: Secondary | ICD-10-CM

## 2020-12-21 DIAGNOSIS — N182 Chronic kidney disease, stage 2 (mild): Secondary | ICD-10-CM

## 2020-12-21 DIAGNOSIS — Z8673 Personal history of transient ischemic attack (TIA), and cerebral infarction without residual deficits: Secondary | ICD-10-CM

## 2020-12-21 DIAGNOSIS — E1122 Type 2 diabetes mellitus with diabetic chronic kidney disease: Secondary | ICD-10-CM

## 2020-12-21 NOTE — Progress Notes (Signed)
Patient ID: Vincent Campbell, male   DOB: June 07, 1941, 80 y.o.   MRN: [id number]        Chief Complaint: here with family, hearing loss       HPI:  Vincent Campbell is a 80 y.o. male here with wife as hearing has gotten even worse in last few months it seems despite good compliance with hearing aids.   Pt denies chest pain, increased sob or doe, wheezing, orthopnea, PND, increased LE swelling, palpitations, dizziness or syncope.   Pt denies polydipsia, polyuria, or new focal neuro s/s.  Pt conts on asa + plavix and plans to f/u vascular surgyry to determine long term need > 3 mo.   Pt denies fever, wt loss, night sweats, loss of appetite, or other constitutional symptoms  No new complaints.         Wt Readings from Last 3 Encounters:  12/21/20 205 lb (93 kg)  12/12/20 206 lb 6.4 oz (93.6 kg)  11/21/20 206 lb (93.4 kg)   BP Readings from Last 3 Encounters:  12/21/20 136/70  12/12/20 (!) 111/58  11/21/20 120/76         Past Medical History:  Diagnosis Date  . BACK PAIN 01/12/2008  . COLONIC POLYPS, HX OF 01/12/2008  . Cough 05/28/2010  . Encounter for well adult exam with abnormal findings 12/29/2010  . HYPERLIPIDEMIA 01/12/2008  . HYPERTENSION 01/12/2008  . Hypothyroidism 09/15/2018  . LEUKOCYTOSIS 03/02/2008  . PERIPHERAL EDEMA 06/28/2010  . SWELLING MASS OR LUMP IN HEAD AND NECK 05/28/2010  . Tremor 08/02/2012   Past Surgical History:  Procedure Laterality Date  . APPENDECTOMY    . CATARACT EXTRACTION    . CHOLECYSTECTOMY    . TRANSCAROTID ARTERY REVASCULARIZATION Right 09/26/2020   Procedure: RIGHT TRANSCAROTID ARTERY REVASCULARIZATION;  Surgeon: Maeola Harman, MD;  Location: [facility] OR;  Service: Vascular;  Laterality: Right;    reports that he quit smoking about 8 months ago. He has never used smokeless tobacco. He reports current alcohol use. He reports that he does not use drugs. family history includes Cancer in an other family member; Heart disease in an other family member;  Hypertension in an other family member. No Known Allergies Current Outpatient Medications on File Prior to Visit  Medication Sig Dispense Refill  . aspirin 81 MG tablet Take 81 mg by mouth daily.    . clopidogrel (PLAVIX) 75 MG tablet Take 1 tablet (75 mg total) by mouth daily. 90 tablet 1  . EUTHYROX 25 MCG tablet TAKE 1 TABLET BY MOUTH ONCE DAILY BEFORE BREAKFAST 90 tablet 3  . furosemide (LASIX) 40 MG tablet Take 1 tablet (40 mg total) by mouth every other day. 45 tablet 3  . Glucosamine 500 MG CAPS Take 500 mg by mouth daily.    . hydrochlorothiazide (MICROZIDE) 12.5 MG capsule Take 1 capsule by mouth once daily 90 capsule 0  . losartan (COZAAR) 25 MG tablet Take 4 tablets (100 mg total) by mouth daily. 360 tablet 0  . metFORMIN (GLUCOPHAGE-XR) 500 MG 24 hr tablet Take 3 tablets (1,500 mg total) by mouth daily with breakfast. 270 tablet 3  . pantoprazole (PROTONIX) 40 MG tablet Take 1 tablet (40 mg total) by mouth daily. 90 tablet 3  . pravastatin (PRAVACHOL) 40 MG tablet Take 1 tablet by mouth once daily 90 tablet 0  . VITAMIN D PO Take 2 capsules by mouth daily.    Marland Kitchen ZINC CITRATE PO Take 1 tablet by mouth daily at 6 (  six) AM.     No current facility-administered medications on file prior to visit.        ROS:  All others reviewed and negative.  Objective        PE:  BP 136/70 (BP Location: Right Arm, Patient Position: Sitting, Cuff Size: Normal)   Pulse 82   Temp 97.8 F (36.6 C) (Oral)   Ht 5' 6.5" (1.689 m)   Wt 205 lb (93 kg)   SpO2 94%   BMI 32.59 kg/m                 Constitutional: Pt appears in NAD               HENT: Head: NCAT.                Right Ear: External ear normal.                 Left Ear: External ear normal.                Eyes: . Pupils are equal, round, and reactive to light. Conjunctivae and EOM are normal               Nose: without d/c or deformity               Neck: Neck supple. Gross normal ROM               Cardiovascular: Normal rate and  regular rhythm.                 Pulmonary/Chest: Effort normal and breath sounds without rales or wheezing.                Abd:  Soft, NT, ND, + BS, no organomegaly               Neurological: Pt is alert. At baseline orientation, motor grossly intact               Skin: Skin is warm. No rashes, no other new lesions, LE edema - trace bilateral left > right               Psychiatric: Pt behavior is normal without agitation   Micro: none  Cardiac tracings I have personally interpreted today:  none  Pertinent Radiological findings (summarize): none   Lab Results  Component Value Date   WBC 13.4 (H) 09/27/2020   HGB 12.9 (L) 09/27/2020   HCT 38.0 (L) 09/27/2020   PLT 282 09/27/2020   GLUCOSE 156 (H) 09/27/2020   CHOL 134 09/23/2020   TRIG 71 09/23/2020   HDL 56 09/23/2020   LDLDIRECT 116.7 05/28/2010   LDLCALC 64 09/23/2020   ALT 21 09/27/2020   AST 22 09/27/2020   NA 136 09/27/2020   K 4.4 09/27/2020   CL 103 09/27/2020   CREATININE 0.93 09/27/2020   BUN 11 09/27/2020   CO2 22 09/27/2020   TSH 3.326 09/21/2020   PSA 2.70 09/20/2019   INR 1.3 (H) 09/20/2020   HGBA1C 7.0 (H) 09/23/2020   MICROALBUR <0.7 09/20/2019   Assessment/Plan:  Vincent Campbell is a 80 y.o. White or Caucasian [1] male with  has a past medical history of BACK PAIN (01/12/2008), COLONIC POLYPS, HX OF (01/12/2008), Cough (05/28/2010), Encounter for well adult exam with abnormal findings (12/29/2010), HYPERLIPIDEMIA (01/12/2008), HYPERTENSION (01/12/2008), Hypothyroidism (09/15/2018), LEUKOCYTOSIS (03/02/2008), PERIPHERAL EDEMA (06/28/2010), SWELLING MASS OR LUMP IN HEAD AND NECK (05/28/2010), and Tremor (08/02/2012).  History  of ischemic stroke Stable, cont DAPT, f/u vascular surgury as planned  Essential hypertension BP Readings from Last 3 Encounters:  12/21/20 136/70  12/12/20 (!) 111/58  11/21/20 120/76   Stable, pt to continue medical treatment hct, losartan   Diabetes (HCC) Lab Results  Component Value  Date   HGBA1C 7.0 (H) 09/23/2020   Stable, pt to continue current medical treatment metformin   CKD (chronic kidney disease), stage II Lab Results  Component Value Date   CREATININE 0.93 09/27/2020   Stable overall, cont to avoid nephrotoxins   Followup: Return in about 6 months (around 06/23/2021).  Oliver Barre, MD 12/23/2020 1:49 PM [place] [facility]

## 2020-12-21 NOTE — Patient Instructions (Signed)
Please continue all other medications as before, and refills have been done if requested. ° °Please have the pharmacy call with any other refills you may need. ° °Please continue your efforts at being more active, low cholesterol diet, and weight control. ° °You are otherwise up to date with prevention measures today. ° °Please keep your appointments with your specialists as you may have planned ° °Please make an Appointment to return in 6 months, or sooner if needed °

## 2020-12-23 ENCOUNTER — Encounter: Payer: Self-pay | Admitting: Internal Medicine

## 2020-12-23 NOTE — Assessment & Plan Note (Signed)
BP Readings from Last 3 Encounters:  12/21/20 136/70  12/12/20 (!) 111/58  11/21/20 120/76   Stable, pt to continue medical treatment hct, losartan

## 2020-12-23 NOTE — Assessment & Plan Note (Signed)
Stable, cont DAPT, f/u vascular surgury as planned

## 2020-12-23 NOTE — Assessment & Plan Note (Signed)
Lab Results  Component Value Date   HGBA1C 7.0 (H) 09/23/2020   Stable, pt to continue current medical treatment metformin

## 2020-12-23 NOTE — Assessment & Plan Note (Signed)
Lab Results  Component Value Date   CREATININE 0.93 09/27/2020   Stable overall, cont to avoid nephrotoxins

## 2020-12-27 ENCOUNTER — Telehealth: Payer: Self-pay

## 2020-12-27 NOTE — Telephone Encounter (Signed)
Pt's wife called with questions about pt taking a steroid. She was confused and thought he was on a steroid but he is not. No further questions/concerns.

## 2021-01-04 ENCOUNTER — Telehealth: Payer: Self-pay | Admitting: Internal Medicine

## 2021-01-08 DIAGNOSIS — H43393 Other vitreous opacities, bilateral: Secondary | ICD-10-CM | POA: Diagnosis not present

## 2021-01-14 ENCOUNTER — Other Ambulatory Visit: Payer: Self-pay | Admitting: Internal Medicine

## 2021-01-14 MED ORDER — LOSARTAN POTASSIUM 25 MG PO TABS: ORAL_TABLET | ORAL | 1 refills | Status: DC

## 2021-01-14 NOTE — Telephone Encounter (Signed)
Please refill as per office routine med refill policy (all routine meds refilled for 3 mo or monthly per pt preference up to one year from last visit, then month to month grace period for 3 mo, then further med refills will have to be denied)  

## 2021-01-14 NOTE — Addendum Note (Signed)
Addended by: Casper Harrison on: 01/14/2021 02:50 PM   Modules accepted: Orders

## 2021-01-14 NOTE — Telephone Encounter (Signed)
   Please resend Losartan to pharmacy Spouse states pharmacy does not have request

## 2021-03-07 ENCOUNTER — Telehealth: Payer: Self-pay | Admitting: Internal Medicine

## 2021-03-07 NOTE — Telephone Encounter (Signed)
Type of form received: DMV   Additional comments:   Received by: [place]   Form should be Faxed to: [phone]  Form should be mailed to: address on file   Is patient requesting call for pickup: n   Form placed in the Provider's box.  *Attach charge sheet.  Provider will determine charge.*  Was patient informed of  7-10 business day turn around (Y/N)? y

## 2021-03-13 NOTE — Telephone Encounter (Signed)
Fax completed.

## 2021-03-25 DIAGNOSIS — Z0000 Encounter for general adult medical examination without abnormal findings: Secondary | ICD-10-CM | POA: Diagnosis not present

## 2021-04-02 ENCOUNTER — Other Ambulatory Visit: Payer: Self-pay

## 2021-04-02 ENCOUNTER — Encounter: Payer: Self-pay | Admitting: Dermatology

## 2021-04-02 ENCOUNTER — Ambulatory Visit: Payer: Medicare (Managed Care) | Admitting: Dermatology

## 2021-04-02 DIAGNOSIS — L720 Epidermal cyst: Secondary | ICD-10-CM | POA: Diagnosis not present

## 2021-04-02 DIAGNOSIS — Z1283 Encounter for screening for malignant neoplasm of skin: Secondary | ICD-10-CM | POA: Diagnosis not present

## 2021-04-02 DIAGNOSIS — L570 Actinic keratosis: Secondary | ICD-10-CM

## 2021-04-06 ENCOUNTER — Encounter: Payer: Self-pay | Admitting: Dermatology

## 2021-04-06 NOTE — Progress Notes (Signed)
   New Patient   Subjective  Vincent Campbell is a 80 y.o. male who presents for the following: Skin Problem (Right side of face x months- red spot/Wife- marilyn in room with patient ).  Check spot on face and behind left ear Location:  Duration:  Quality:  Associated Signs/Symptoms: Modifying Factors:  Severity:  Timing: Context:    The following portions of the chart were reviewed this encounter and updated as appropriate:  Tobacco  Allergies  Meds  Problems  Med Hx  Surg Hx  Fam Hx      Objective  Well appearing patient in no apparent distress; mood and affect are within normal limits. Head Waist up exam right temple scale treated with otc lotion     Mid Back Noninflamed 1 cm dermal nodule  Mid Forehead Multiple small pink gritty crusts    A focused examination was performed including head, neck, back, upper chest, arms.. Relevant physical exam findings are noted in the Assessment and Plan.   Assessment & Plan  Screening exam for skin cancer Head  Photo only return if it bleeds or changes, also cyst on the back stable defer any treatment   Epidermal cyst Mid Back  Leave if stable  AK (actinic keratosis) Mid Forehead  Currently none are bothersome or historically changing.  Return if there is growth or bleeding.

## 2021-04-16 ENCOUNTER — Other Ambulatory Visit: Payer: Self-pay | Admitting: Internal Medicine

## 2021-04-16 ENCOUNTER — Telehealth: Payer: Self-pay | Admitting: Internal Medicine

## 2021-04-16 NOTE — Telephone Encounter (Signed)
Duplicate reqeust

## 2021-04-16 NOTE — Telephone Encounter (Signed)
1.Medication Requested: hydrochlorothiazide (MICROZIDE) 12.5 MG capsule  2. Pharmacy (Name, Street, [place]): [facility] [address] [place] - [address]  Phone:  [phone] Fax:  [phone]   3. On Med List: yes  4. Last Visit with PCP: 05.27.22  5. Next visit date with PCP: 11.28.22   Agent: Please be advised that RX refills may take up to 3 business days. We ask that you follow-up with your pharmacy.

## 2021-06-10 ENCOUNTER — Telehealth: Payer: Self-pay | Admitting: Internal Medicine

## 2021-06-10 NOTE — Telephone Encounter (Signed)
Type of form received (Home Health, FMLA, disability, handicapped placard, Surgical clearance)  DMV paper work   Form placed in Careers adviser, Artist)  Community education officer  Additional instructions from the patient (mail, fax, notify by phone when complete)  Notify by phone when completed   Things to remember: Front office: If form received in person, remind patient that forms take 7-10 business days CMA should attach charge sheet and put on [employer]

## 2021-06-13 NOTE — Telephone Encounter (Signed)
Paperwork being held until patient has ROV. Patient's wife notified

## 2021-06-14 ENCOUNTER — Other Ambulatory Visit: Payer: Self-pay | Admitting: Internal Medicine

## 2021-06-14 MED ORDER — PRAVASTATIN SODIUM 40 MG PO TABS: 40.0000 mg | ORAL_TABLET | Freq: Every day | ORAL | 3 refills | Status: DC

## 2021-06-14 NOTE — Telephone Encounter (Signed)
Patient's wife called to check on the status of refill. Relayed the refill time window. Patient understood and has enough to get him through the weekend.

## 2021-06-14 NOTE — Addendum Note (Signed)
Addended by: Corwin Levins on: 06/14/2021 05:05 PM   Modules accepted: Orders

## 2021-06-24 ENCOUNTER — Ambulatory Visit (INDEPENDENT_AMBULATORY_CARE_PROVIDER_SITE_OTHER): Payer: Medicare (Managed Care) | Admitting: Internal Medicine

## 2021-06-24 ENCOUNTER — Other Ambulatory Visit: Payer: Self-pay

## 2021-06-24 ENCOUNTER — Encounter: Payer: Self-pay | Admitting: Internal Medicine

## 2021-06-24 VITALS — BP 112/58 | HR 58 | Temp 97.6°F | Ht 66.5 in | Wt 204.0 lb

## 2021-06-24 DIAGNOSIS — N182 Chronic kidney disease, stage 2 (mild): Secondary | ICD-10-CM | POA: Diagnosis not present

## 2021-06-24 DIAGNOSIS — E538 Deficiency of other specified B group vitamins: Secondary | ICD-10-CM | POA: Diagnosis not present

## 2021-06-24 DIAGNOSIS — Z8673 Personal history of transient ischemic attack (TIA), and cerebral infarction without residual deficits: Secondary | ICD-10-CM | POA: Diagnosis not present

## 2021-06-24 DIAGNOSIS — E7800 Pure hypercholesterolemia, unspecified: Secondary | ICD-10-CM

## 2021-06-24 DIAGNOSIS — E039 Hypothyroidism, unspecified: Secondary | ICD-10-CM | POA: Diagnosis not present

## 2021-06-24 DIAGNOSIS — E559 Vitamin D deficiency, unspecified: Secondary | ICD-10-CM

## 2021-06-24 DIAGNOSIS — E1122 Type 2 diabetes mellitus with diabetic chronic kidney disease: Secondary | ICD-10-CM

## 2021-06-24 DIAGNOSIS — I10 Essential (primary) hypertension: Secondary | ICD-10-CM

## 2021-06-24 LAB — POCT GLYCOSYLATED HEMOGLOBIN (HGB A1C): Hemoglobin A1C: 7 % — AB (ref 4.0–5.6)

## 2021-06-24 NOTE — Patient Instructions (Signed)
Your A1c was done today ° °Please continue all other medications as before, and refills have been done if requested. ° °Please have the pharmacy call with any other refills you may need. ° °Please continue your efforts at being more active, low cholesterol diet, and weight control ° °Please keep your appointments with your specialists as you may have planned ° °Please make an Appointment to return in 3 months, or sooner if needed, also with Lab Appointment for testing done 3-5 days before at the FIRST FLOOR Lab (so this is for TWO appointments - please see the scheduling desk as you leave) ° °Due to the ongoing Covid 19 pandemic, our lab now requires an appointment for any labs done at our office.  If you need labs done and do not have an appointment, please call our office ahead of time to schedule before presenting to the lab for your testing. ° ° °

## 2021-06-24 NOTE — Progress Notes (Signed)
Patient ID: Vincent Campbell, male   DOB: 05-03-1941, 80 y.o.   MRN: [id number]        Chief Complaint: follow up HTN, HLD and hyperglycemia, ckd, low thyroid       HPI:  Vincent Campbell is a 80 y.o. male here overall doing ok, Pt denies chest pain, increased sob or doe, wheezing, orthopnea, PND, increased LE swelling, palpitations, dizziness or syncope.   Pt denies polydipsia, polyuria, or low sugar symptoms.  Denies hyper or hypo thyroid symptoms such as voice, skin or hair change.   Pt denies fever, wt loss, night sweats, loss of appetite, or other constitutional symptoms   Frustrated with limitations after stroke, concerned about driver license renewal  Need form filled out  Wt Readings from Last 3 Encounters:  06/24/21 204 lb (92.5 kg)  12/21/20 205 lb (93 kg)  12/12/20 206 lb 6.4 oz (93.6 kg)   BP Readings from Last 3 Encounters:  06/24/21 (!) 112/58  12/21/20 136/70  12/12/20 (!) 111/58         Past Medical History:  Diagnosis Date   BACK PAIN 01/12/2008   COLONIC POLYPS, HX OF 01/12/2008   Cough 05/28/2010   Encounter for well adult exam with abnormal findings 12/29/2010   HYPERLIPIDEMIA 01/12/2008   HYPERTENSION 01/12/2008   Hypothyroidism 09/15/2018   LEUKOCYTOSIS 03/02/2008   PERIPHERAL EDEMA 06/28/2010   SWELLING MASS OR LUMP IN HEAD AND NECK 05/28/2010   Tremor 08/02/2012   Past Surgical History:  Procedure Laterality Date   APPENDECTOMY     CATARACT EXTRACTION     CHOLECYSTECTOMY     TRANSCAROTID ARTERY REVASCULARIZATION  Right 09/26/2020   Procedure: RIGHT TRANSCAROTID ARTERY REVASCULARIZATION;  Surgeon: Waynetta Sandy, MD;  Location: [facility];  Service: Vascular;  Laterality: Right;    reports that he quit smoking about 14 months ago. His smoking use included cigarettes. He has never used smokeless tobacco. He reports current alcohol use. He reports that he does not use drugs. family history includes Cancer in an other family member; Heart disease in an other family  member; Hypertension in an other family member. No Known Allergies Current Outpatient Medications on File Prior to Visit  Medication Sig Dispense Refill   aspirin 81 MG tablet Take 81 mg by mouth daily.     clopidogrel (PLAVIX) 75 MG tablet Take 1 tablet (75 mg total) by mouth daily. 90 tablet 1   EUTHYROX 25 MCG tablet TAKE 1 TABLET BY MOUTH ONCE DAILY BEFORE BREAKFAST 90 tablet 3   furosemide (LASIX) 40 MG tablet Take 1 tablet (40 mg total) by mouth every other day. 45 tablet 3   Glucosamine 500 MG CAPS Take 500 mg by mouth daily.     hydrochlorothiazide (MICROZIDE) 12.5 MG capsule Take 1 capsule by mouth once daily 90 capsule 0   losartan (COZAAR) 25 MG tablet TAKE 4 TABLETS BY MOUTH ONCE DAILY 360 tablet 1   metFORMIN (GLUCOPHAGE-XR) 500 MG 24 hr tablet Take 3 tablets (1,500 mg total) by mouth daily with breakfast. 270 tablet 3   pantoprazole (PROTONIX) 40 MG tablet Take 1 tablet (40 mg total) by mouth daily. 90 tablet 3   pravastatin (PRAVACHOL) 40 MG tablet Take 1 tablet (40 mg total) by mouth daily. 90 tablet 3   VITAMIN D PO Take 2 capsules by mouth daily.     ZINC CITRATE PO Take 1 tablet by mouth daily at 6 (six) AM.     No current facility-administered medications  on file prior to visit.        ROS:  All others reviewed and negative.  Objective        PE:  BP (!) 112/58 (BP Location: Left Arm, Patient Position: Sitting, Cuff Size: Large)   Pulse (!) 58   Temp 97.6 F (36.4 C) (Oral)   Ht 5' 6.5" (1.689 m)   Wt 204 lb (92.5 kg)   SpO2 93%   BMI 32.43 kg/m                 Constitutional: Pt appears in NAD               HENT: Head: NCAT.                Right Ear: External ear normal.                 Left Ear: External ear normal.                Eyes: . Pupils are equal, round, and reactive to light. Conjunctivae and EOM are normal               Nose: without d/c or deformity               Neck: Neck supple. Gross normal ROM               Cardiovascular: Normal rate and  regular rhythm.                 Pulmonary/Chest: Effort normal and breath sounds without rales or wheezing.                Abd:  Soft, NT, ND, + BS, no organomegaly               Neurological: Pt is alert. At baseline orientation, motor grossly intact               Skin: Skin is warm. No rashes, no other new lesions, LE edema - none               Psychiatric: Pt behavior is normal without agitation   Micro: none  Cardiac tracings I have personally interpreted today:  none  Pertinent Radiological findings (summarize): none   Lab Results  Component Value Date   WBC 13.4 (H) 09/27/2020   HGB 12.9 (L) 09/27/2020   HCT 38.0 (L) 09/27/2020   PLT 282 09/27/2020   GLUCOSE 156 (H) 09/27/2020   CHOL 134 09/23/2020   TRIG 71 09/23/2020   HDL 56 09/23/2020   LDLDIRECT 116.7 05/28/2010   LDLCALC 64 09/23/2020   ALT 21 09/27/2020   AST 22 09/27/2020   NA 136 09/27/2020   K 4.4 09/27/2020   CL 103 09/27/2020   CREATININE 0.93 09/27/2020   BUN 11 09/27/2020   CO2 22 09/27/2020   TSH 3.326 09/21/2020   PSA 2.70 09/20/2019   INR 1.3 (H) 09/20/2020   HGBA1C 7.0 (A) 06/24/2021   MICROALBUR <0.7 09/20/2019   Hemoglobin A1C 4.0 - 5.6 % 7.0 Abnormal   7.0 High  R, CM  7.2 High  R, CM  7.9 High  R, CM  7.6 High  R, CM  7.4 High  R, CM  7.0 High      Assessment/Plan:  Vincent Campbell is a 80 y.o. White or Caucasian [1] male with  has a past medical history of BACK PAIN (01/12/2008), COLONIC POLYPS, HX OF (01/12/2008), Cough (  05/28/2010), Encounter for well adult exam with abnormal findings (12/29/2010), HYPERLIPIDEMIA (01/12/2008), HYPERTENSION (01/12/2008), Hypothyroidism (09/15/2018), LEUKOCYTOSIS (03/02/2008), PERIPHERAL EDEMA (06/28/2010), SWELLING MASS OR LUMP IN HEAD AND NECK (05/28/2010), and Tremor (08/02/2012).  History of ischemic stroke Form filled out for DMV, cont plavix  Hyperlipidemia Lab Results  Component Value Date   LDLCALC 64 09/23/2020   Stable, pt to continue current statin  pravachol 40   Essential hypertension BP Readings from Last 3 Encounters:  06/24/21 (!) 112/58  12/21/20 136/70  12/12/20 (!) 111/58   Stable, pt to continue medical treatment losartan, hct   Diabetes (HCC) Lab Results  Component Value Date   HGBA1C 7.0 (A) 06/24/2021   Stable, pt to continue current medical treatment metformin   Hypothyroidism Lab Results  Component Value Date   TSH 3.326 09/21/2020   Stable, pt to continue levothyroxine   CKD (chronic kidney disease), stage II Lab Results  Component Value Date   CREATININE 0.93 09/27/2020   Stable overall, cont to avoid nephrotoxins  Followup: Return in about 3 months (around 09/24/2021).  Oliver Barre, MD 06/30/2021 9:04 AM [facility]

## 2021-06-25 ENCOUNTER — Ambulatory Visit: Payer: Medicare (Managed Care) | Admitting: Adult Health

## 2021-06-25 NOTE — Progress Notes (Deleted)
[facility] [address] [place]. [place] [address] [phone]       OFFICE FOLLOW-UP NOTE  [name] Date of Birth:  25-Mar-1941 Medical Record Number:  [id number]   Primary neurologist: Dr. Leonie Man Reason for visit: Stroke follow-up   No chief complaint on file.     HPI:.    Update 06/25/2021 JM: Returns for 58-month stroke follow-up  Stable from stroke standpoint -denies new stroke/TIA symptoms Residual ***  Remains on both aspirin and Plavix and pravastatin -denies side effects Blood pressure today *** Recent A1c 7.0 Routinely follows with PCP Dr. Jenny Reichmann      History provided for reference purposes only Initial visit 12/12/2020 Dr. Leonie Man: Mr. Preszler is a 80 year old Caucasian male seen today for initial office follow-up visit following hospital admission for stroke in February 2022.  He is accompanied by his wife.  History is obtained from them and review of electronic medical records and I personally reviewed pertinent available imaging films in PACS.  He has past medical history of diabetes, hypertension, hyperlipidemia who presented to [facility] long ED on 09/21/2020 for evaluation for lethargy and confusion.  In the ED was worked up for decreased level of oxygen saturation which was found to be in the low 90s on room air.  White count is slightly elevated and chest x-ray showed bibasilar opacities.  However MRI scan was obtained which showed scattered small subacute infarcts involving the right cerebrum basal ganglia and anterior corpus callosum as well as chronic right basal ganglia and left corona radiator lacunar infarcts.  NIH stroke scale was 1.  CT angiogram showed 90% proximal right ICA stenosis and less than 50% proximal left ICA stenosis.  This was confirmed on the carotid ultrasound which showed 80 to 99% right ICA stenosis.  2D echo showed ejection fraction 60 to 65%.  LDL cholesterol 64 mg percent.  Hemoglobin A1c was 7.0.  Patient was seen  by vascular surgery who did elective right carotid artery angioplasty and stenting with TCAR procedure which went well.  Patient was started on aspirin Plavix which is tolerating well with minor bruising and no bleeding.  He states his sugars have been quite well controlled at home.  He is tolerating Lipitor well without muscle aches and pains.  His blood pressure is quite good at home and today it is 111/58.  He still has a slight diminished fine motor skills in the left hand but otherwise is doing well.  His main problem is his significantly impaired hearing despite his hearing aids which makes him quite frustrated.  He did have a follow-up carotid ultrasound done on 10/29/2020 which showed patent right internal carotid stent without significant restenosis.  ROS:   14 system review of systems is positive for weakness, imbalance and all other systems negative.  PMH:  Past Medical History:  Diagnosis Date   BACK PAIN 01/12/2008   COLONIC POLYPS, HX OF 01/12/2008   Cough 05/28/2010   Encounter for well adult exam with abnormal findings 12/29/2010   HYPERLIPIDEMIA 01/12/2008   HYPERTENSION 01/12/2008   Hypothyroidism 09/15/2018   LEUKOCYTOSIS 03/02/2008   PERIPHERAL EDEMA 06/28/2010   SWELLING MASS OR LUMP IN HEAD AND NECK 05/28/2010   Tremor 08/02/2012    Social History:  Social History   Socioeconomic History   Marital status: Married    Spouse name: [name]    Number of children: Not on file   Years of education: Not on file   Highest education level: Not on  file  Occupational History   Occupation: Heritage manager: [employer]   Occupation: former Radio producer drama   Occupation: former  Nurse, 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 [employer]  Tobacco Use   Smoking status: Former    Types: Cigarettes    Quit date: 04/01/2020    Years since quitting: 1.2   Smokeless tobacco: Never  Substance and Sexual Activity   Alcohol use: Yes   Drug use: No   Sexual activity: Not on file  Other Topics  Concern   Not on file  Social History Narrative   Lives in own home with wife   Left Handed   Drinks 3-4 cups caffeine daily         Social Determinants of Health   Financial Resource Strain: Not on file  Food Insecurity: Not on file  Transportation Needs: Not on file  Physical Activity: Not on file  Stress: Not on file  Social Connections: Not on file  Intimate Partner Violence: Not on file    Medications:   Current Outpatient Medications on File Prior to Visit  Medication Sig Dispense Refill   aspirin 81 MG tablet Take 81 mg by mouth daily.     clopidogrel (PLAVIX) 75 MG tablet Take 1 tablet (75 mg total) by mouth daily. 90 tablet 1   EUTHYROX 25 MCG tablet TAKE 1 TABLET BY MOUTH ONCE DAILY BEFORE BREAKFAST 90 tablet 3   furosemide (LASIX) 40 MG tablet Take 1 tablet (40 mg total) by mouth every other day. 45 tablet 3   Glucosamine 500 MG CAPS Take 500 mg by mouth daily.     hydrochlorothiazide (MICROZIDE) 12.5 MG capsule Take 1 capsule by mouth once daily 90 capsule 0   losartan (COZAAR) 25 MG tablet TAKE 4 TABLETS BY MOUTH ONCE DAILY 360 tablet 1   metFORMIN (GLUCOPHAGE-XR) 500 MG 24 hr tablet Take 3 tablets (1,500 mg total) by mouth daily with breakfast. 270 tablet 3   pantoprazole (PROTONIX) 40 MG tablet Take 1 tablet (40 mg total) by mouth daily. 90 tablet 3   pravastatin (PRAVACHOL) 40 MG tablet Take 1 tablet (40 mg total) by mouth daily. 90 tablet 3   VITAMIN D PO Take 2 capsules by mouth daily.     ZINC CITRATE PO Take 1 tablet by mouth daily at 6 (six) AM.     No current facility-administered medications on file prior to visit.    Allergies:  No Known Allergies  Physical Exam There were no vitals filed for this visit. There is no height or weight on file to calculate BMI.   General: Obese elderly Caucasian male seated, in no evident distress Head: head normocephalic and atraumatic.  Neck: supple with no carotid or supraclavicular bruits Cardiovascular:  regular rate and rhythm, no murmurs Musculoskeletal: no deformity Skin:  no rash/petichiae Vascular:  Normal pulses all extremities  Neurologic Exam Mental Status: Awake and fully alert. Oriented to place and time. Recent and remote memory intact. Attention span, concentration and fund of knowledge appropriate. Mood and affect appropriate.  Cranial Nerves: Pupils equal, briskly reactive to light. Extraocular movements full without nystagmus. Visual fields full to confrontation. Hearing severely diminished bilaterally despite hearing aids facial sensation intact.  Mild left lower facial asymmetry when he smiles., tongue, palate moves normally and symmetrically.  Motor: Normal bulk and tone. Normal strength in all tested extremity muscles except diminished fine finger movements on the left and mild weakness of left grip.  Orbits right over  left upper extremity.. Sensory.: intact to touch ,pinprick .position and vibratory sensation.  Coordination: Rapid alternating movements normal in all extremities. Finger-to-nose and heel-to-shin performed accurately bilaterally. Gait and Station: Arises from chair without difficulty. Stance is broad-based. Gait demonstrates normal stride length but mild imbalance when he turns..  Not able to heel, toe and tandem walk .  Reflexes: 1+ and symmetric. Toes downgoing.      ASSESSMENT: 80 year old Caucasian male with symptomatic severe right carotid stenosis in February 2022 who has been extremely well and is undergone interval right carotid TCAR revascularization procedure.  Vascular risk factors of diabetes, hypertension, hyperlipidemia , obesity and carotid disease    PLAN:  Continue aspirin 81 mg daily and pravastatin 40 mg daily for secondary stroke prevention  Advised to stop plavix per Dr. Clydene Fake recommendations (completed 9 months of DAPT s/p TCAR) Ensure routine follow-up with vascular surgery for surveillance monitoring s/p TCAR Goals: HTN with blood  pressure goal below 130/90, DM with A1c goal below 6.5% and HLD with LDL cholesterol goal below 70 mg/dL. No medications managed by our office     CC:  [facility] provider: Biagio Borg, MD   I spent *** minutes of face-to-face and non-face-to-face time with patient.  This included previsit chart review, lab review, study review, order entry, electronic health record documentation, patient education  Frann Rider, [facility]  [facility] [address] [place],  [address]  Phone [phone] Fax [phone] Note: This document was prepared with digital dictation and possible smart phrase technology. Any transcriptional errors that result from this process are unintentional.

## 2021-06-30 ENCOUNTER — Encounter: Payer: Self-pay | Admitting: Internal Medicine

## 2021-06-30 NOTE — Assessment & Plan Note (Signed)
Lab Results  Component Value Date   TSH 3.326 09/21/2020   Stable, pt to continue levothyroxine

## 2021-06-30 NOTE — Assessment & Plan Note (Addendum)
Form filled out for DMV, cont plavix

## 2021-06-30 NOTE — Assessment & Plan Note (Signed)
Lab Results  Component Value Date   CREATININE 0.93 09/27/2020   Stable overall, cont to avoid nephrotoxins

## 2021-06-30 NOTE — Assessment & Plan Note (Signed)
BP Readings from Last 3 Encounters:  06/24/21 (!) 112/58  12/21/20 136/70  12/12/20 (!) 111/58   Stable, pt to continue medical treatment losartan, hct

## 2021-06-30 NOTE — Assessment & Plan Note (Signed)
Lab Results  Component Value Date   LDLCALC 64 09/23/2020   Stable, pt to continue current statin pravachol 40

## 2021-06-30 NOTE — Assessment & Plan Note (Signed)
Lab Results  Component Value Date   HGBA1C 7.0 (A) 06/24/2021   Stable, pt to continue current medical treatment metformin

## 2021-07-02 ENCOUNTER — Encounter: Payer: Self-pay | Admitting: Adult Health

## 2021-07-02 ENCOUNTER — Other Ambulatory Visit: Payer: Self-pay

## 2021-07-02 ENCOUNTER — Ambulatory Visit: Payer: Medicare (Managed Care) | Admitting: Adult Health

## 2021-07-02 VITALS — BP 124/62 | HR 57 | Ht 66.0 in | Wt 205.0 lb

## 2021-07-02 DIAGNOSIS — Z8673 Personal history of transient ischemic attack (TIA), and cerebral infarction without residual deficits: Secondary | ICD-10-CM

## 2021-07-02 DIAGNOSIS — I6521 Occlusion and stenosis of right carotid artery: Secondary | ICD-10-CM

## 2021-07-02 NOTE — Patient Instructions (Signed)
Continue aspirin 81 mg daily  and pravastatin  for secondary stroke prevention  Stop plavix at this time as previously recommended by Dr. Pearlean Brownie  Continue to follow up with PCP regarding blood pressure, cholesterol and diabetes management  Maintain strict control of hypertension with blood pressure goal below 130/90, diabetes with hemoglobin A1c goal below 7.0 % and cholesterol with LDL cholesterol (bad cholesterol) goal below 70 mg/dL.   Continue to follow with vascular surgery for monitoring of carotid stenosis post procedure as advised         Thank you for coming to see Vincent Campbell at [facility]. I hope we have been able to provide you high quality care today.  You may receive a patient satisfaction survey over the next few weeks. We would appreciate your feedback and comments so that we may continue to improve ourselves and the health of our patients.

## 2021-07-02 NOTE — Progress Notes (Signed)
[facility] [address] [place]. [place] [address] [phone]       OFFICE FOLLOW-UP NOTE  [name] Date of Birth:  1940/10/20 Medical Record Number:  [id number]   Primary neurologist: Dr. Leonie Man Reason for visit: Stroke follow-up   Chief Complaint  Patient presents with   Follow-up    Rm 3 with wife here for 6 month f/u reports he has been doing well.        HPI:.    Update 06/25/2021 JM: Returns for 80-month stroke follow-up accompanied by his wife  Stable from stroke standpoint -denies new stroke/TIA symptoms Denies residual stroke deficits - reports he has been feeling well overall. Chronic occasional imbalance and short term memory loss which has been stable  Remains on both aspirin and Plavix and pravastatin -denies side effects Blood pressure today 124/62 Recent A1c 7.0 Routinely follows with PCP Dr. Jenny Reichmann   Prior to completing visit, wife became very upset when asked if he has continued on both aspirin and plavix. She stated that she tried to contact vascular surgery several times but never got a call back. She states she is very frustrated that "all these doctors" cannot communicate with each other and this is confusing to the patients and family that takes care of them. She repeatedly stated "this is why I hate the medical field". I attempted to tell her that at times, vascular surgery likes to continue DAPT for up to 1 year at times after a TCAR procedure and it would be appropriate to discuss if ongoing use of Plavix is needed at follow up visit. I also offered reaching out to VVS to see if plavix is still needed at this time. She then became very upset crying. Gave her husband his jacket and hat, and said "this visit is over. We are never coming back here" and both patient and wife left office.       History provided for reference purposes only Initial visit 12/12/2020 Dr. Leonie Man: [name] is a 80 year old Caucasian male seen  today for 80-year old Caucasian male seen  today for initial office follow-up visit following hospital admission for stroke in February 2022.  He is accompanied by his wife.  History is obtained from them and review of electronic medical records and I personally reviewed pertinent available imaging films in PACS.  He has past medical history of diabetes, hypertension, hyperlipidemia who presented to [facility] long ED on 09/21/2020 for evaluation for lethargy and confusion.  In the ED was worked up for decreased level of oxygen saturation which was found to be in the low 90s on room air.  White count is slightly elevated and chest x-ray showed bibasilar opacities.  However MRI scan was obtained which showed scattered small subacute infarcts involving the right cerebrum basal ganglia and anterior corpus callosum as well as chronic right basal ganglia and left corona radiator lacunar infarcts.  NIH stroke scale was 1.  CT angiogram showed 90% proximal right ICA stenosis and less than 50% proximal left ICA stenosis.  This was confirmed on the carotid ultrasound which showed 80 to 99% right ICA stenosis.  2D echo showed ejection fraction 60 to 65%.  LDL cholesterol 64 mg percent.  Hemoglobin A1c was 7.0.  Patient was seen by vascular surgery who did elective right carotid artery angioplasty and stenting with TCAR procedure which went well.  Patient was started on aspirin Plavix which is tolerating well with minor bruising and no bleeding.  He states his sugars have been quite well controlled at  home.  He is tolerating Lipitor well without muscle aches and pains.  His blood pressure is quite good at home and today it is 111/58.  He still has a slight diminished fine motor skills in the left hand but otherwise is doing well.  His main problem is his significantly impaired hearing despite his hearing aids which makes him quite frustrated.  He did have a follow-up carotid ultrasound done on 10/29/2020 which showed patent right internal carotid stent without significant  restenosis.  ROS:   14 system review of systems is positive for those listed in HPI and all other systems negative.  PMH:  Past Medical History:  Diagnosis Date   BACK PAIN 01/12/2008   COLONIC POLYPS, HX OF 01/12/2008   Cough 05/28/2010   Encounter for well adult exam with abnormal findings 12/29/2010   HYPERLIPIDEMIA 01/12/2008   HYPERTENSION 01/12/2008   Hypothyroidism 09/15/2018   LEUKOCYTOSIS 03/02/2008   PERIPHERAL EDEMA 06/28/2010   SWELLING MASS OR LUMP IN HEAD AND NECK 05/28/2010   Tremor 08/02/2012    Social History:  Social History   Socioeconomic History   Marital status: Married    Spouse name: [name]    Number of children: Not on file   Years of education: Not on file   Highest education level: Not on file  Occupational History   Occupation: OWNER    Employer: [employer]   Occupation: former Automotive engineer drama   Occupation: former  Location manager at [employer]   Smoking status: Former    Types: Cigarettes    Quit date: 04/01/2020    Years since quitting: 1.2   Smokeless tobacco: Never  Substance and Sexual Activity   Alcohol use: Yes   Drug use: No   Sexual activity: Not on file  Other Topics Concern   Not on file  Social History Narrative   Lives in own home with wife   Left Handed   Drinks 3-4 cups caffeine daily         Social Determinants of Health   Financial Resource Strain: Not on file  Food Insecurity: Not on file  Transportation Needs: Not on file  Physical Activity: Not on file  Stress: Not on file  Social Connections: Not on file  Intimate Partner Violence: Not on file    Medications:   Current Outpatient Medications on File Prior to Visit  Medication Sig Dispense Refill   aspirin 81 MG tablet Take 81 mg by mouth daily.     clopidogrel (PLAVIX) 75 MG tablet Take 1 tablet (75 mg total) by mouth daily. 90 tablet 1   EUTHYROX 25 MCG tablet TAKE 1 TABLET BY MOUTH ONCE DAILY BEFORE BREAKFAST 90 tablet 3    furosemide (LASIX) 40 MG tablet Take 1 tablet (40 mg total) by mouth every other day. 45 tablet 3   Glucosamine 500 MG CAPS Take 500 mg by mouth daily.     hydrochlorothiazide (MICROZIDE) 12.5 MG capsule Take 1 capsule by mouth once daily 90 capsule 0   losartan (COZAAR) 25 MG tablet TAKE 4 TABLETS BY MOUTH ONCE DAILY 360 tablet 1   metFORMIN (GLUCOPHAGE-XR) 500 MG 24 hr tablet Take 3 tablets (1,500 mg total) by mouth daily with breakfast. 270 tablet 3   pantoprazole (PROTONIX) 40 MG tablet Take 1 tablet (40 mg total) by mouth daily. 90 tablet 3   pravastatin (PRAVACHOL) 40 MG tablet Take 1 tablet (40 mg total) by mouth daily. 90 tablet  3   VITAMIN D PO Take 2 capsules by mouth daily.     ZINC CITRATE PO Take 1 tablet by mouth daily at 6 (six) AM.     No current facility-administered medications on file prior to visit.    Allergies:  No Known Allergies  Physical Exam Today's Vitals   07/02/21 1049  BP: 124/62  Pulse: (!) 57  SpO2: 96%  Weight: 205 lb (93 kg)  Height: 5\' 6"  (1.676 m)   Body mass index is 33.09 kg/m.   *exam limited as patient and wife left visit prior to exam** General: Obese elderly Caucasian male seated, in no evident distress  Neurologic Exam Mental Status: Awake and fully alert. Oriented to place and time. Recent memory subjectively impaired and remote memory intact. Attention span, concentration and fund of knowledge appropriate. Mood and affect appropriate.  Cranial Nerves: Hearing severely diminished bilaterally despite hearing aids facial sensation intact.   Motor: UTA Sensory.: UTA Coordination: UTA Gait and Station: Arises from chair with mild difficulty. Stance is broad-based. Gait demonstrates normal stride length but mild imbalance when he turns without use of AD.  Reflexes: UTA     ASSESSMENT: 80 year old Caucasian male with right cerebral hemisphere strokes in setting of symptomatic severe right carotid stenosis in February 2022 who has been  extremely well and is undergone interval right carotid TCAR revascularization procedure.  Vascular risk factors of diabetes, hypertension, hyperlipidemia , obesity and carotid disease    PLAN:  Continue aspirin 81 mg daily and pravastatin 40 mg daily for secondary stroke prevention  Advised to f/u with VVS in regards to ongoing use of Plavix as continued DAPT not indicated from stroke standpoint Ensure routine follow-up with vascular surgery for surveillance monitoring s/p TCAR Goals: HTN with blood pressure goal below 130/90, DM with A1c goal below 6.5% and HLD with LDL cholesterol goal below 70 mg/dL. No medications managed by our office   No follows will be made at this time If any f/u visits needed in the future, can be scheduled with Dr. March 2022    CC:  [facility] provider: Dr. Pearlean Brownie PCP: Pearlean Brownie, MD  VVS: Dr. Corwin Levins  I spent 23 minutes of face-to-face and non-face-to-face time with patient and wife.  This included previsit chart review, lab review, study review, electronic health record documentation, patient and wife discussion regarding history of prior strokes and current medication usage but no further education or discussion completed (see HPI)  Myra Gianotti, [facility]  [facility] [address] [place], [place]  Phone [phone] Fax [phone] Note: This document was prepared with digital dictation and possible smart phrase technology. Any transcriptional errors that result from this process are unintentional.

## 2021-07-05 ENCOUNTER — Other Ambulatory Visit: Payer: Self-pay | Admitting: Internal Medicine

## 2021-07-05 MED ORDER — LOSARTAN POTASSIUM 25 MG PO TABS: ORAL_TABLET | ORAL | 1 refills | Status: DC

## 2021-07-05 NOTE — Telephone Encounter (Signed)
1.Medication Requested: losartan (COZAAR) 25 MG tablet Propranolol- 10MG  TABLET    2. Pharmacy (Name, Street, [place]): [facility] [address], [address] Phone:  [phone]  Fax:  [phone]     3. On Med List: Y 4. Last Visit with PCP: 11.28.2023   5. Next visit date with PCP: 2.28.2023   Agent: Please be advised that RX refills may take up to 3 business days. We ask that you follow-up with your pharmacy.

## 2021-07-05 NOTE — Addendum Note (Signed)
Addended by: Deatra Trayveon on: 07/05/2021 04:35 PM   Modules accepted: Orders

## 2021-07-05 NOTE — Progress Notes (Signed)
I agree with the above plan 

## 2021-07-15 ENCOUNTER — Ambulatory Visit: Payer: Medicare (Managed Care) | Admitting: Dermatology

## 2021-07-27 ENCOUNTER — Other Ambulatory Visit: Payer: Self-pay | Admitting: Internal Medicine

## 2021-07-31 ENCOUNTER — Telehealth: Payer: Self-pay | Admitting: Internal Medicine

## 2021-07-31 NOTE — Telephone Encounter (Signed)
1.Medication Requested: propranolol 10mg   2. Pharmacy (Name, [address] [place]): [facility] [address] - [address]  3. On Med List: no  4. Last Visit with PCP: 06-24-2021  5. Next visit date with PCP: 09-24-2021  Patient states medication was last filled 03-25-2021 by provider  I could not locate medication on medication list

## 2021-07-31 NOTE — Telephone Encounter (Signed)
Is patient taking this? I don't see it on med list

## 2021-07-31 NOTE — Telephone Encounter (Signed)
No it appears pt is mistaken.  He was on this for several yrs, but last rx was done sept 2021, and apparently this was stopped since then.   I would not think he would need to continue this medication at this time.   thanks

## 2021-07-31 NOTE — Telephone Encounter (Signed)
Patient notified

## 2021-08-09 MED ORDER — LOSARTAN POTASSIUM 25 MG PO TABS: ORAL_TABLET | ORAL | 1 refills | Status: DC

## 2021-08-09 NOTE — Telephone Encounter (Signed)
1.Medication Requested: losartan (COZAAR) 25 MG tablet  2. Pharmacy (Name, Street, [place]): [id number] [place], [place] - [address]  3. On Med List: yes  4. Last Visit with PCP: 06-24-2021  5. Next visit date with PCP: 09-24-2021   Agent: Please be advised that RX refills may take up to 3 business days. We ask that you follow-up with your pharmacy.

## 2021-08-09 NOTE — Addendum Note (Signed)
Addended by: Zenovia Jordan A on: 08/09/2021 01:26 PM   Modules accepted: Orders

## 2021-08-25 ENCOUNTER — Other Ambulatory Visit: Payer: Self-pay | Admitting: Internal Medicine

## 2021-09-22 ENCOUNTER — Other Ambulatory Visit: Payer: Self-pay | Admitting: Internal Medicine

## 2021-09-22 NOTE — Telephone Encounter (Signed)
Please refill as per office routine med refill policy (all routine meds to be refilled for 3 mo or monthly (per pt preference) up to one year from last visit, then month to month grace period for 3 mo, then further med refills will have to be denied) ? ?

## 2021-09-24 ENCOUNTER — Ambulatory Visit (INDEPENDENT_AMBULATORY_CARE_PROVIDER_SITE_OTHER): Payer: Medicare (Managed Care) | Admitting: Internal Medicine

## 2021-09-24 ENCOUNTER — Encounter: Payer: Self-pay | Admitting: Internal Medicine

## 2021-09-24 ENCOUNTER — Other Ambulatory Visit: Payer: Self-pay

## 2021-09-24 VITALS — BP 118/60 | HR 66 | Temp 98.0°F | Ht 66.0 in | Wt 209.0 lb

## 2021-09-24 DIAGNOSIS — E559 Vitamin D deficiency, unspecified: Secondary | ICD-10-CM | POA: Diagnosis not present

## 2021-09-24 DIAGNOSIS — E538 Deficiency of other specified B group vitamins: Secondary | ICD-10-CM

## 2021-09-24 DIAGNOSIS — E7800 Pure hypercholesterolemia, unspecified: Secondary | ICD-10-CM

## 2021-09-24 DIAGNOSIS — E039 Hypothyroidism, unspecified: Secondary | ICD-10-CM

## 2021-09-24 DIAGNOSIS — Z0001 Encounter for general adult medical examination with abnormal findings: Secondary | ICD-10-CM

## 2021-09-24 DIAGNOSIS — I10 Essential (primary) hypertension: Secondary | ICD-10-CM

## 2021-09-24 DIAGNOSIS — N182 Chronic kidney disease, stage 2 (mild): Secondary | ICD-10-CM | POA: Diagnosis not present

## 2021-09-24 DIAGNOSIS — E1122 Type 2 diabetes mellitus with diabetic chronic kidney disease: Secondary | ICD-10-CM | POA: Diagnosis not present

## 2021-09-24 LAB — T4, FREE: Free T4: 0.88 ng/dL (ref 0.60–1.60)

## 2021-09-24 LAB — CBC WITH DIFFERENTIAL/PLATELET
Basophils Absolute: 0.1 10*3/uL (ref 0.0–0.1)
Basophils Relative: 0.5 % (ref 0.0–3.0)
Eosinophils Absolute: 0.4 10*3/uL (ref 0.0–0.7)
Eosinophils Relative: 3.7 % (ref 0.0–5.0)
HCT: 41.9 % (ref 39.0–52.0)
Hemoglobin: 14 g/dL (ref 13.0–17.0)
Lymphocytes Relative: 26.6 % (ref 12.0–46.0)
Lymphs Abs: 2.9 10*3/uL (ref 0.7–4.0)
MCHC: 33.3 g/dL (ref 30.0–36.0)
MCV: 99.1 fl (ref 78.0–100.0)
Monocytes Absolute: 1.7 10*3/uL — ABNORMAL HIGH (ref 0.1–1.0)
Monocytes Relative: 15.8 % — ABNORMAL HIGH (ref 3.0–12.0)
Neutro Abs: 5.8 10*3/uL (ref 1.4–7.7)
Neutrophils Relative %: 53.4 % (ref 43.0–77.0)
Platelets: 279 10*3/uL (ref 150.0–400.0)
RBC: 4.23 Mil/uL (ref 4.22–5.81)
RDW: 14.2 % (ref 11.5–15.5)
WBC: 10.9 10*3/uL — ABNORMAL HIGH (ref 4.0–10.5)

## 2021-09-24 LAB — HEPATIC FUNCTION PANEL
ALT: 15 U/L (ref 0–53)
AST: 18 U/L (ref 0–37)
Albumin: 4.5 g/dL (ref 3.5–5.2)
Alkaline Phosphatase: 59 U/L (ref 39–117)
Bilirubin, Direct: 0.1 mg/dL (ref 0.0–0.3)
Total Bilirubin: 0.8 mg/dL (ref 0.2–1.2)
Total Protein: 7.7 g/dL (ref 6.0–8.3)

## 2021-09-24 LAB — BASIC METABOLIC PANEL
BUN: 20 mg/dL (ref 6–23)
CO2: 29 mEq/L (ref 19–32)
Calcium: 10 mg/dL (ref 8.4–10.5)
Chloride: 99 mEq/L (ref 96–112)
Creatinine, Ser: 1.15 mg/dL (ref 0.40–1.50)
GFR: 60.07 mL/min (ref >60.00)
Glucose, Bld: 121 mg/dL — ABNORMAL HIGH (ref 70–99)
Potassium: 3.8 mEq/L (ref 3.5–5.1)
Sodium: 139 mEq/L (ref 135–145)

## 2021-09-24 LAB — URINALYSIS, ROUTINE W REFLEX MICROSCOPIC
Bilirubin Urine: NEGATIVE
Hgb urine dipstick: NEGATIVE
Ketones, ur: NEGATIVE
Leukocytes,Ua: NEGATIVE
Nitrite: NEGATIVE
RBC / HPF: NONE SEEN (ref 0–?)
Specific Gravity, Urine: 1.02 (ref 1.000–1.030)
Total Protein, Urine: NEGATIVE
Urine Glucose: NEGATIVE
Urobilinogen, UA: 0.2 (ref 0.0–1.0)
WBC, UA: NONE SEEN (ref 0–?)
pH: 5.5 (ref 5.0–8.0)

## 2021-09-24 LAB — MICROALBUMIN / CREATININE URINE RATIO
Creatinine,U: 61.3 mg/dL
Microalb Creat Ratio: 1.1 mg/g (ref 0.0–30.0)
Microalb, Ur: <0.7 mg/dL (ref 0.0–1.9)

## 2021-09-24 LAB — VITAMIN D 25 HYDROXY (VIT D DEFICIENCY, FRACTURES): VITD: 85.89 ng/mL (ref 30.00–100.00)

## 2021-09-24 LAB — LIPID PANEL
Cholesterol: 190 mg/dL (ref 0–200)
HDL: 63.8 mg/dL (ref >39.00)
LDL Cholesterol: 88 mg/dL (ref 0–99)
NonHDL: 126.68
Total CHOL/HDL Ratio: 3
Triglycerides: 194 mg/dL — ABNORMAL HIGH (ref 0.0–149.0)
VLDL: 38.8 mg/dL (ref 0.0–40.0)

## 2021-09-24 LAB — TSH: TSH: 5.15 u[IU]/mL (ref 0.35–5.50)

## 2021-09-24 LAB — VITAMIN B12: Vitamin B-12: 904 pg/mL (ref 211–911)

## 2021-09-24 LAB — HEMOGLOBIN A1C: Hgb A1c MFr Bld: 7.1 % — ABNORMAL HIGH (ref 4.6–6.5)

## 2021-09-24 MED ORDER — FUROSEMIDE 40 MG PO TABS: 40.0000 mg | ORAL_TABLET | ORAL | 3 refills | Status: DC

## 2021-09-24 NOTE — Progress Notes (Signed)
Patient ID: Vincent Campbell, male   DOB: 10-23-1940, 81 y.o.   MRN: [id number]         Chief Complaint:: wellness exam and Follow-up  Depression, dm, htn, hld hypothyroid       HPI:  Vincent Campbell is a 81 y.o. male here for wellness exam; declines flu shot, shingrix, o/w up to date                        Also has been 562 days since last cigarette (and he is still counting..  Denies worsening depressive symptoms, suicidal ideation, or panic; though he has chronic mild depression and irritability again seen today, and declines need for change in tx or referral.  Pt denies chest pain, increased sob or doe, wheezing, orthopnea, PND, increased LE swelling, palpitations, dizziness or syncope.   Pt denies polydipsia, polyuria, or new focal neuro s/s.    Pt denies fever, wt loss, night sweats, loss of appetite, or other constitutional symptoms  Denies hyper or hypo thyroid symptoms such as voice, skin or hair change.  Trying to follow appropriate diet, but not always successful, declines need for diet referral.  Wife remains supportive   Wt Readings from Last 3 Encounters:  09/24/21 209 lb (94.8 kg)  07/02/21 205 lb (93 kg)  06/24/21 204 lb (92.5 kg)   BP Readings from Last 3 Encounters:  09/24/21 118/60  07/02/21 124/62  06/24/21 (!) 112/58   Immunization History  Administered Date(s) Administered   Fluad Quad(high Dose 65+) 03/16/2019, 06/14/2020   Influenza, High Dose Seasonal PF 06/26/2016, 07/13/2017, 09/15/2018   Influenza,inj,Quad PF,6+ Mos 05/30/2015   PFIZER(Purple Top)SARS-COV-2 Vaccination 10/27/2019, 11/28/2019   Pneumococcal Conjugate-13 02/03/2013, 08/11/2013   Pneumococcal Polysaccharide-23 07/28/2012   Td 07/28/2001   Tetanus 02/03/2013   There are no preventive care reminders to display for this patient.     Past Medical History:  Diagnosis Date   BACK PAIN 01/12/2008   COLONIC POLYPS, HX OF 01/12/2008   Cough 05/28/2010   Encounter for well adult exam with abnormal  findings 12/29/2010   HYPERLIPIDEMIA 01/12/2008   HYPERTENSION 01/12/2008   Hypothyroidism 09/15/2018   LEUKOCYTOSIS 03/02/2008   PERIPHERAL EDEMA 06/28/2010   SWELLING MASS OR LUMP IN HEAD AND NECK 05/28/2010   Tremor 08/02/2012   Past Surgical History:  Procedure Laterality Date   APPENDECTOMY     CATARACT EXTRACTION     CHOLECYSTECTOMY     TRANSCAROTID ARTERY REVASCULARIZATION  Right 09/26/2020   Procedure: RIGHT TRANSCAROTID ARTERY REVASCULARIZATION;  Surgeon: Maeola Harman, MD;  Location: [facility] OR;  Service: Vascular;  Laterality: Right;    reports that he quit smoking about 17 months ago. His smoking use included cigarettes. He has never used smokeless tobacco. He reports current alcohol use. He reports that he does not use drugs. family history includes Cancer in an other family member; Heart disease in an other family member; Hypertension in an other family member. No Known Allergies Current Outpatient Medications on File Prior to Visit  Medication Sig Dispense Refill   aspirin 81 MG tablet Take 81 mg by mouth daily.     clopidogrel (PLAVIX) 75 MG tablet Take 1 tablet by mouth once daily 90 tablet 1   EUTHYROX 25 MCG tablet TAKE 1 TABLET BY MOUTH ONCE DAILY BEFORE BREAKFAST 90 tablet 3   Glucosamine 500 MG CAPS Take 500 mg by mouth daily.     hydrochlorothiazide (MICROZIDE) 12.5 MG capsule Take 1  capsule by mouth once daily 90 capsule 0   losartan (COZAAR) 25 MG tablet TAKE 4 TABLETS BY MOUTH ONCE DAILY 360 tablet 1   metFORMIN (GLUCOPHAGE-XR) 500 MG 24 hr tablet Take 3 tablets (1,500 mg total) by mouth daily with breakfast. 270 tablet 3   pantoprazole (PROTONIX) 40 MG tablet Take 1 tablet (40 mg total) by mouth daily. 90 tablet 3   pravastatin (PRAVACHOL) 40 MG tablet Take 1 tablet (40 mg total) by mouth daily. 90 tablet 3   VITAMIN D PO Take 2 capsules by mouth daily.     ZINC CITRATE PO Take 1 tablet by mouth daily at 6 (six) AM.     No current facility-administered  medications on file prior to visit.        ROS:  All others reviewed and negative.  Objective        PE:  BP 118/60 (BP Location: Right Arm, Patient Position: Sitting, Cuff Size: Large)    Pulse 66    Temp 98 F (36.7 C) (Oral)    Ht 5\' 6"  (1.676 m)    Wt 209 lb (94.8 kg)    SpO2 94%    BMI 33.73 kg/m                 Constitutional: Pt appears in NAD               HENT: Head: NCAT.                Right Ear: External ear normal.                 Left Ear: External ear normal.                Eyes: . Pupils are equal, round, and reactive to light. Conjunctivae and EOM are normal               Nose: without d/c or deformity               Neck: Neck supple. Gross normal ROM               Cardiovascular: Normal rate and regular rhythm.                 Pulmonary/Chest: Effort normal and breath sounds without rales or wheezing.                Abd:  Soft, NT, ND, + BS, no organomegaly               Neurological: Pt is alert. At baseline orientation, motor grossly intact               Skin: Skin is warm. No rashes, no other new lesions, LE edema - chronic trace bilataral               Psychiatric: Pt behavior is normal without agitation   Micro: none  Cardiac tracings I have personally interpreted today:  none  Pertinent Radiological findings (summarize): none   Lab Results  Component Value Date   WBC 10.9 (H) 09/24/2021   HGB 14.0 09/24/2021   HCT 41.9 09/24/2021   PLT 279.0 09/24/2021   GLUCOSE 121 (H) 09/24/2021   CHOL 190 09/24/2021   TRIG 194.0 (H) 09/24/2021   HDL 63.80 09/24/2021   LDLDIRECT 116.7 05/28/2010   LDLCALC 88 09/24/2021   ALT 15 09/24/2021   AST 18 09/24/2021   NA 139 09/24/2021   K 3.8 09/24/2021  CL 99 09/24/2021   CREATININE 1.15 09/24/2021   BUN 20 09/24/2021   CO2 29 09/24/2021   TSH 5.15 09/24/2021   PSA 2.70 09/20/2019   INR 1.3 (H) 09/20/2020   HGBA1C 7.1 (H) 09/24/2021   MICROALBUR <0.7 09/24/2021   Assessment/Plan:  Vincent Campbell is a 81  y.o. White or Caucasian [1] male with  has a past medical history of BACK PAIN (01/12/2008), COLONIC POLYPS, HX OF (01/12/2008), Cough (05/28/2010), Encounter for well adult exam with abnormal findings (12/29/2010), HYPERLIPIDEMIA (01/12/2008), HYPERTENSION (01/12/2008), Hypothyroidism (09/15/2018), LEUKOCYTOSIS (03/02/2008), PERIPHERAL EDEMA (06/28/2010), SWELLING MASS OR LUMP IN HEAD AND NECK (05/28/2010), and Tremor (08/02/2012).  Encounter for well adult exam with abnormal findings Age and sex appropriate education and counseling updated with regular exercise and diet Referrals for preventative services - none needed Immunizations addressed - declines flu shot, shingrix Smoking counseling  - none needed Evidence for depression or other mood disorder -chronic depression no change Most recent labs reviewed. I have personally reviewed and have noted: 1) the patient's medical and social history 2) The patient's current medications and supplements 3) The patient's height, weight, and BMI have been recorded in the chart   Diabetes [facility]) Lab Results  Component Value Date   HGBA1C 7.1 (H) 09/24/2021   Mild uncontrolled, goal < 7, pt to continue current medical treatment metformin as declines change today   Essential hypertension BP Readings from Last 3 Encounters:  09/24/21 118/60  07/02/21 124/62  06/24/21 (!) 112/58   Stable, pt to continue medical treatment losartan hct   Hyperlipidemia Lab Results  Component Value Date   LDLCALC 88 09/24/2021   Uncontrolled, goal ldl < 70, pt to continue current statin pravachol 40 as decines change today   Hypothyroidism Lab Results  Component Value Date   TSH 5.15 09/24/2021   Stable, pt to continue levothyroxine  Followup: Return in about 6 months (around 03/24/2022).  Oliver Barre, MD 09/26/2021 4:20 AM [facility]

## 2021-09-24 NOTE — Patient Instructions (Signed)

## 2021-09-26 ENCOUNTER — Encounter: Payer: Self-pay | Admitting: Internal Medicine

## 2021-09-26 NOTE — Assessment & Plan Note (Signed)
Age and sex appropriate education and counseling updated with regular exercise and diet ?Referrals for preventative services - none needed ?Immunizations addressed - declines flu shot, shingrix ?Smoking counseling  - none needed ?Evidence for depression or other mood disorder -chronic depression no change ?Most recent labs reviewed. ?I have personally reviewed and have noted: ?1) the patient's medical and social history ?2) The patient's current medications and supplements ?3) The patient's height, weight, and BMI have been recorded in the chart ? ?

## 2021-09-26 NOTE — Assessment & Plan Note (Signed)
Lab Results  ?Component Value Date  ? LDLCALC 88 09/24/2021  ? ?Uncontrolled, goal ldl < 70, pt to continue current statin pravachol 40 as decines change today ? ?

## 2021-09-26 NOTE — Assessment & Plan Note (Signed)
Lab Results  ?Component Value Date  ? HGBA1C 7.1 (H) 09/24/2021  ? ?Mild uncontrolled, goal < 7, pt to continue current medical treatment metformin as declines change today ? ?

## 2021-09-26 NOTE — Assessment & Plan Note (Addendum)
Lab Results  ?Component Value Date  ? TSH 5.15 09/24/2021  ? ?Stable, pt to continue levothyroxine ? ?

## 2021-09-26 NOTE — Assessment & Plan Note (Signed)
BP Readings from Last 3 Encounters:  ?09/24/21 118/60  ?07/02/21 124/62  ?06/24/21 (!) 112/58  ? ?Stable, pt to continue medical treatment losartan hct ? ?

## 2021-10-27 ENCOUNTER — Other Ambulatory Visit: Payer: Self-pay

## 2021-10-27 DIAGNOSIS — R609 Edema, unspecified: Secondary | ICD-10-CM

## 2021-10-27 DIAGNOSIS — I6521 Occlusion and stenosis of right carotid artery: Secondary | ICD-10-CM

## 2021-10-30 NOTE — Progress Notes (Signed)
?HISTORY AND PHYSICAL  ? ? ? ?CC:  follow up. ?Requesting Provider:  Corwin Levins, MD ? ?HPI: This is a 81 y.o. male here for follow up for carotid artery stenosis.  Pt is s/p TCAR  for symptomatic carotid artery stenosis on 09/26/2020 by Dr. Randie Heinz.   ? ?Pt was last seen 10/29/2020 and at that time he was doing well ? ?Pt returns today for follow up.   ? ?Pt denies any amaurosis fugax, speech difficulties, weakness, numbness, paralysis or clumsiness or facial droop.   ? ?He states he is doing quite well.  He goes to the [facility] 3x/week and is up to walking .8 mile.  He states he is working out on 12-13 machines and he feels good.  He does not have cramping in his calves, non healing wounds or rest pain.  He does have some swelling at his ankles.   ? ?The pt is on a statin for cholesterol management.  ?The pt is on a daily aspirin.   Other AC:  Plavix ?The pt is on ARB, diuretic for hypertension.   ?The pt is diabetic.   ?Tobacco hx:  former ? ?He states that Dr. Pearlean Brownie wants to know whether to stop asa or plavix at this point.   ? ?Past Medical History:  ?Diagnosis Date  ? BACK PAIN 01/12/2008  ? COLONIC POLYPS, HX OF 01/12/2008  ? Cough 05/28/2010  ? Encounter for well adult exam with abnormal findings 12/29/2010  ? HYPERLIPIDEMIA 01/12/2008  ? HYPERTENSION 01/12/2008  ? Hypothyroidism 09/15/2018  ? LEUKOCYTOSIS 03/02/2008  ? PERIPHERAL EDEMA 06/28/2010  ? SWELLING MASS OR LUMP IN HEAD AND NECK 05/28/2010  ? Tremor 08/02/2012  ? ? ?Past Surgical History:  ?Procedure Laterality Date  ? APPENDECTOMY    ? CATARACT EXTRACTION    ? CHOLECYSTECTOMY    ? TRANSCAROTID ARTERY REVASCULARIZATION?  Right 09/26/2020  ? Procedure: RIGHT TRANSCAROTID ARTERY REVASCULARIZATION;  Surgeon: Maeola Harman, MD;  Location: [facility] OR;  Service: Vascular;  Laterality: Right;  ? ? ?No Known Allergies ? ?Current Outpatient Medications  ?Medication Sig Dispense Refill  ? aspirin 81 MG tablet Take 81 mg by mouth daily.    ? clopidogrel (PLAVIX) 75 MG tablet  Take 1 tablet by mouth once daily 90 tablet 1  ? EUTHYROX 25 MCG tablet TAKE 1 TABLET BY MOUTH ONCE DAILY BEFORE BREAKFAST 90 tablet 3  ? furosemide (LASIX) 40 MG tablet Take 1 tablet (40 mg total) by mouth every other day. 45 tablet 3  ? Glucosamine 500 MG CAPS Take 500 mg by mouth daily.    ? hydrochlorothiazide (MICROZIDE) 12.5 MG capsule Take 1 capsule by mouth once daily 90 capsule 0  ? losartan (COZAAR) 25 MG tablet TAKE 4 TABLETS BY MOUTH ONCE DAILY 360 tablet 1  ? metFORMIN (GLUCOPHAGE-XR) 500 MG 24 hr tablet Take 3 tablets (1,500 mg total) by mouth daily with breakfast. 270 tablet 3  ? pantoprazole (PROTONIX) 40 MG tablet Take 1 tablet (40 mg total) by mouth daily. 90 tablet 3  ? pravastatin (PRAVACHOL) 40 MG tablet Take 1 tablet (40 mg total) by mouth daily. 90 tablet 3  ? VITAMIN D PO Take 2 capsules by mouth daily.    ? ZINC CITRATE PO Take 1 tablet by mouth daily at 6 (six) AM.    ? ?No current facility-administered medications for this visit.  ? ? ?Family History  ?Problem Relation Age of Onset  ? Cancer Other   ?  esophageal cancer  ? Heart disease Other   ? Hypertension Other   ? ? ?Social History  ? ?Socioeconomic History  ? Marital status: Married  ?  Spouse name: Jola BabinskiMarilyn   ? Number of children: Not on file  ? Years of education: Not on file  ? Highest education level: Not on file  ?Occupational History  ? Occupation: OWNER  ?  Employer: [employer]  ? Occupation: former college professor drama  ? 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 Smoking status: Former  ?  Types: Cigarettes  ?  Quit date: 04/01/2020  ?  Years since quitting: 1.5  ? Smokeless tobacco: Never  ?Substance and Sexual Activity  ? Alcohol use: Yes  ? Drug use: No  ? Sexual activity: Not on file  ?Other Topics Concern  ? Not on file  ?Social History Narrative  ? Lives in own home with wife  ? Left Handed  ? Drinks 3-4 cups caffeine daily  ?   ?   ? ?Social Determinants of Health   ? ?Financial Resource Strain: Not on file  ?Food Insecurity: Not on file  ?Transportation Needs: Not on file  ?Physical Activity: Not on file  ?Stress: Not on file  ?Social Connections: Not on file  ?Intimate Partner Violence: Not on file  ? ? ? ?REVIEW OF SYSTEMS:  ? ?[X]  denotes positive finding, [ ]  denotes negative finding ?Cardiac  Comments:  ?Chest pain or chest pressure:    ?Shortness of breath upon exertion:    ?Short of breath when lying flat:    ?Irregular heart rhythm:    ?    ?Vascular    ?Pain in calf, thigh, or hip brought on by ambulation:    ?Pain in feet at night that wakes you up from your sleep:  x slight  ?Blood clot in your veins:    ?Leg swelling:     ?    ?Pulmonary    ?Oxygen at home:    ?Productive cough:     ?Wheezing:     ?    ?Neurologic    ?Sudden weakness in arms or legs:     ?Sudden numbness in arms or legs:     ?Sudden onset of difficulty speaking or slurred speech:    ?Temporary loss of vision in one eye:     ?Problems with dizziness:     ?    ?Gastrointestinal    ?Blood in stool:     ?Vomited blood:     ?    ?Genitourinary    ?Burning when urinating:     ?Blood in urine:    ?    ?Psychiatric    ?Major depression:     ?    ?Hematologic    ?Bleeding problems:    ?Problems with blood clotting too easily:    ?    ?Skin    ?Rashes or ulcers:    ?    ?Constitutional    ?Fever or chills:    ? ? ?PHYSICAL EXAMINATION: ? ?Today's Vitals  ? 11/06/21 0912 11/06/21 0913  ?BP: (!) 151/75 132/72  ?Pulse: 76 72  ?Temp: (!) 96.5 ?F (35.8 ?C)   ?Weight: 208 lb 9.6 oz (94.6 kg)   ?Height: 5\' 6"  (1.676 m)   ? ?Body mass index is 33.67 kg/m?. ? ? ?General:  WDWN in NAD; vital signs documented above ?Gait: Not observed ?HENT: WNL, normocephalic ?Pulmonary: normal non-labored breathing ?  Cardiac: regular HR, without carotid bruits ?Abdomen: soft, NT; aortic pulse is not palpable ?Skin: without rashes ?Vascular Exam/Pulses: ? Right Left  ?Radial 2+ (normal) 2+ (normal)  ?DP Brisk monophasic Brisk biphasic   ?PT Brisk monophasic Brisk monophasic  ? ?Extremities: without ischemic changes, without Gangrene , without cellulitis; without open wounds; mild pitting edema with socks bilateral ankles.  ?Musculoskeletal: no muscle wasting or atrophy  ?Neurologic: A&O X 3; moving all extremities equally; speech is fluent/normal ?Psychiatric:  The pt has Normal affect. ? ? ?Non-Invasive Vascular Imaging:   ?Carotid Duplex on 11/06/2021: ?Right:  patent stent ?Left:  1-39% ICA stenosis ?Vertebrals:  Bilateral vertebral arteries demonstrate antegrade flow.  ?Subclavians: Normal flow hemodynamics were seen in bilateral subclavian arteries.  ? ?Previous Carotid duplex on 10/29/2020: ?Right: no evidence  of CA stenosis ?Left:   1-39% ICA stenosis ? ?ABI/TBI 11/06/2021 ?Right:  1.08/0.73 Great toe pressure:  101 ?Left:  1.13/0.70 Great toe pressure 97 ? ? ?ASSESSMENT/PLAN:: 81 y.o. male here for follow up carotid artery stenosis and is s/p TCAR  for symptomatic carotid artery stenosis on 09/26/2020 by Dr. Randie Heinz.   ? ?Carotid stenosis ?-duplex today reveals the right carotid stent is patent.  He is currently on asa and plavix.  Ok to stop the asa and continue plavix.   ?-discussed s/s of stroke with pt and he understands should he develop any of these sx, he will go to the nearest ER or call 911. ?-pt will f/u in one year with carotid duplex and see Dr. Randie Heinz per pt request.   ? ?PAD ?-pt ABI reports triphasic waveforms but on exam, they are brisk monophasic and not palpable.  He is walking at the [facility] and is up to .8 of a mile without claudication.  He does not have rest pain or wounds.  Encouraged him to continue a graduated walking program and staying active.   ?-will not repeat ABI unless he has issues.  ? ? ?Doreatha Massed, PAC ?[facility] ?[phone] ? ?Clinic MD:  Edilia Bo ?

## 2021-11-06 ENCOUNTER — Ambulatory Visit (INDEPENDENT_AMBULATORY_CARE_PROVIDER_SITE_OTHER)
Admission: RE | Admit: 2021-11-06 | Discharge: 2021-11-06 | Disposition: A | Discharge status: Home / Self Care | Payer: Medicare (Managed Care) | Source: Ambulatory Visit | Attending: Vascular Surgery | Admitting: Vascular Surgery

## 2021-11-06 ENCOUNTER — Encounter: Payer: Self-pay | Admitting: Physician Assistant

## 2021-11-06 ENCOUNTER — Ambulatory Visit (HOSPITAL_COMMUNITY)
Admission: RE | Admit: 2021-11-06 | Discharge: 2021-11-06 | Disposition: A | Discharge status: Home / Self Care | Payer: Medicare (Managed Care) | Source: Ambulatory Visit | Attending: Vascular Surgery | Admitting: Vascular Surgery

## 2021-11-06 ENCOUNTER — Ambulatory Visit: Payer: Medicare (Managed Care) | Admitting: Physician Assistant

## 2021-11-06 VITALS — BP 132/72 | HR 72 | Temp 96.5°F | Ht 66.0 in | Wt 208.6 lb

## 2021-11-06 DIAGNOSIS — I6523 Occlusion and stenosis of bilateral carotid arteries: Secondary | ICD-10-CM | POA: Diagnosis not present

## 2021-11-06 DIAGNOSIS — R609 Edema, unspecified: Secondary | ICD-10-CM

## 2021-11-06 DIAGNOSIS — I739 Peripheral vascular disease, unspecified: Secondary | ICD-10-CM

## 2021-11-06 DIAGNOSIS — I6521 Occlusion and stenosis of right carotid artery: Secondary | ICD-10-CM

## 2021-11-30 ENCOUNTER — Other Ambulatory Visit: Payer: Self-pay | Admitting: Internal Medicine

## 2021-11-30 NOTE — Telephone Encounter (Signed)
Please refill as per office routine med refill policy (all routine meds to be refilled for 3 mo or monthly (per pt preference) up to one year from last visit, then month to month grace period for 3 mo, then further med refills will have to be denied) ? ?

## 2021-12-05 ENCOUNTER — Telehealth: Payer: Self-pay | Admitting: Internal Medicine

## 2021-12-05 NOTE — Telephone Encounter (Signed)
PT's spouse calls today in regards to getting a refill on the pantoprazole (PROTONIX) 40 mg. If possible she would like this RX sent out to the [facility] pharmacy, [address] [facility] [place] on file! ? ?CB: [phone] ?

## 2021-12-05 NOTE — Telephone Encounter (Signed)
Unable to reach patient's spouse. Prescription was sent to pharmacy 12/03/21. ?

## 2021-12-13 ENCOUNTER — Other Ambulatory Visit: Payer: Self-pay | Admitting: Internal Medicine

## 2021-12-13 ENCOUNTER — Telehealth: Payer: Self-pay | Admitting: Internal Medicine

## 2021-12-13 MED ORDER — METFORMIN HCL ER 500 MG PO TB24: 1500.0000 mg | ORAL_TABLET | Freq: Every day | ORAL | 3 refills | Status: DC

## 2021-12-13 NOTE — Telephone Encounter (Signed)
Please refill as per office routine med refill policy (all routine meds to be refilled for 3 mo or monthly (per pt preference) up to one year from last visit, then month to month grace period for 3 mo, then further med refills will have to be denied) ? ?

## 2021-12-13 NOTE — Telephone Encounter (Signed)
Prescription sent to pharmacy.

## 2021-12-13 NOTE — Telephone Encounter (Signed)
Patient would like his metformin called in - Please call this into [facility]

## 2022-01-14 ENCOUNTER — Telehealth: Payer: Self-pay | Admitting: Internal Medicine

## 2022-01-14 MED ORDER — LOSARTAN POTASSIUM 25 MG PO TABS: ORAL_TABLET | ORAL | 1 refills | Status: DC

## 2022-01-14 NOTE — Telephone Encounter (Signed)
Caller & Relationship to patient: Spouse Benjimin Hadden  Call back number: [phone]  Date of last office visit: 09/24/21  Date of next office visit: 03/17/22  Medication(s) to be refilled:  losartan (COZAAR) 25 MG tablet      Preferred Pharmacy:  [facility] [address], [place] - [address] Phone:  [phone]  Fax:  [phone]

## 2022-01-14 NOTE — Telephone Encounter (Signed)
Medication sent to pharmacy  

## 2022-01-20 ENCOUNTER — Telehealth: Payer: Self-pay | Admitting: Internal Medicine

## 2022-02-28 ENCOUNTER — Telehealth: Payer: Self-pay | Admitting: Internal Medicine

## 2022-02-28 MED ORDER — CLOPIDOGREL BISULFATE 75 MG PO TABS: 75.0000 mg | ORAL_TABLET | Freq: Every day | ORAL | 0 refills | Status: DC

## 2022-02-28 NOTE — Telephone Encounter (Signed)
Caller & Relationship to patient: Angas Isabell  Call back number: [phone]  Date of last office visit: 09/24/21  Date of next office visit: 03/17/22  Medication(s) to be refilled:  clopidogrel (PLAVIX) 75 MG tablet   Preferred Pharmacy:  [facility] [address], [place] - [address] Phone:  [phone]  Fax:  [phone]

## 2022-02-28 NOTE — Telephone Encounter (Signed)
Refill sent to pharmacy.   

## 2022-03-13 ENCOUNTER — Ambulatory Visit (INDEPENDENT_AMBULATORY_CARE_PROVIDER_SITE_OTHER): Payer: Medicare (Managed Care)

## 2022-03-13 DIAGNOSIS — Z0000 Encounter for general adult medical examination without abnormal findings: Secondary | ICD-10-CM | POA: Diagnosis not present

## 2022-03-13 NOTE — Progress Notes (Signed)
I connected with Vincent Campbell today by telephone and verified that I am speaking with the correct person using two identifiers. Location patient: home Location provider: work Persons participating in the virtual visit: patient, provider.   I discussed the limitations, risks, security and privacy concerns of performing an evaluation and management service by telephone and the availability of in person appointments. I also discussed with the patient that there may be a patient responsible charge related to this service. The patient expressed understanding and verbally consented to this telephonic visit.    Interactive audio and video telecommunications were attempted between this provider and patient, however failed, due to patient having technical difficulties OR patient did not have access to video capability.  We continued and completed visit with audio only.  Some vital signs may be absent or patient reported.   Time Spent with patient on telephone encounter: 30 minutes  Subjective:   Vincent Campbell is a 81 y.o. male who presents for Medicare Annual/Subsequent preventive examination.  Review of Systems     Cardiac Risk Factors include: advanced age (>21men, >35 women);diabetes mellitus;dyslipidemia;hypertension;family history of premature cardiovascular disease;male gender     Objective:    There were no vitals filed for this visit. There is no height or weight on file to calculate BMI.     03/13/2022    2:51 PM 09/21/2020   11:00 AM 09/20/2020   11:07 PM  Advanced Directives  Does Patient Have a Medical Advance Directive? No No Unable to assess, patient is non-responsive or altered mental status  Does patient want to make changes to medical advance directive? No - Patient declined    Would patient like information on creating a medical advance directive? No - Patient declined No - Patient declined     Current Medications (verified) Outpatient Encounter Medications as of  03/13/2022  Medication Sig   clopidogrel (PLAVIX) 75 MG tablet Take 1 tablet (75 mg total) by mouth daily.   EUTHYROX 25 MCG tablet TAKE 1 TABLET BY MOUTH ONCE DAILY BEFORE BREAKFAST   furosemide (LASIX) 40 MG tablet Take 1 tablet (40 mg total) by mouth every other day.   Glucosamine 500 MG CAPS Take 500 mg by mouth daily.   hydrochlorothiazide (MICROZIDE) 12.5 MG capsule Take 1 capsule by mouth once daily   losartan (COZAAR) 25 MG tablet TAKE 4 TABLETS BY MOUTH ONCE DAILY   metFORMIN (GLUCOPHAGE-XR) 500 MG 24 hr tablet Take 3 tablets (1,500 mg total) by mouth daily with breakfast.   pantoprazole (PROTONIX) 40 MG tablet Take 1 tablet by mouth once daily   pravastatin (PRAVACHOL) 40 MG tablet Take 1 tablet (40 mg total) by mouth daily.   VITAMIN D PO Take 2 capsules by mouth daily.   ZINC CITRATE PO Take 1 tablet by mouth daily at 6 (six) AM.   aspirin 81 MG tablet Take 81 mg by mouth daily. (Patient not taking: Reported on 03/13/2022)   No facility-administered encounter medications on file as of 03/13/2022.    Allergies (verified) Patient has no known allergies.   History: Past Medical History:  Diagnosis Date   BACK PAIN 01/12/2008   COLONIC POLYPS, HX OF 01/12/2008   Cough 05/28/2010   Encounter for well adult exam with abnormal findings 12/29/2010   HYPERLIPIDEMIA 01/12/2008   HYPERTENSION 01/12/2008   Hypothyroidism 09/15/2018   LEUKOCYTOSIS 03/02/2008   PERIPHERAL EDEMA 06/28/2010   SWELLING MASS OR LUMP IN HEAD AND NECK 05/28/2010   Tremor 08/02/2012   Past Surgical History:  Procedure Laterality Date   APPENDECTOMY     CATARACT EXTRACTION     CHOLECYSTECTOMY     TRANSCAROTID ARTERY REVASCULARIZATION  Right 09/26/2020   Procedure: RIGHT TRANSCAROTID ARTERY REVASCULARIZATION;  Surgeon: Waynetta Sandy, MD;  Location: [facility];  Service: Vascular;  Laterality: Right;   Family History  Problem Relation Age of Onset   Cancer Other        esophageal cancer   Heart disease Other     Hypertension Other    Social History   Socioeconomic History   Marital status: Married    Spouse name: [name]    Number of children: Not on file   Years of education: Not on file   Highest education level: Not on file  Occupational History   Occupation: OWNER    Employer: [employer]   Occupation: former Automotive engineer drama   Occupation: former  Location manager at [employer]   Smoking status: Former    Types: Cigarettes    Quit date: 04/01/2020    Years since quitting: 1.9   Smokeless tobacco: Never  Substance and Sexual Activity   Alcohol use: Yes   Drug use: No   Sexual activity: Not on file  Other Topics Concern   Not on file  Social History Narrative   Lives in own home with wife   Left Handed   Drinks 3-4 cups caffeine daily         Social Determinants of Health   Financial Resource Strain: Low Risk  (03/13/2022)   Overall Financial Resource Strain (CARDIA)    Difficulty of Paying Living Expenses: Not hard at all  Food Insecurity: No Food Insecurity (03/13/2022)   Hunger Vital Sign    Worried About Running Out of Food in the Last Year: Never true    [place] in the Last Year: Never true  Transportation Needs: No Transportation Needs (03/13/2022)   PRAPARE - Hydrologist (Medical): No    Lack of Transportation (Non-Medical): No  Physical Activity: Sufficiently Active (03/13/2022)   Exercise Vital Sign    Days of Exercise per Week: 3 days    Minutes of Exercise per Session: 60 min  Stress: No Stress Concern Present (03/13/2022)   [place]    Feeling of Stress : Not at all  Social Connections: [place] (03/13/2022)   Social Connection and Isolation Panel [NHANES]    Frequency of Communication with Friends and Family: More than three times a week    Frequency of Social Gatherings with Friends and Family: Once a week     Attends Religious Services: 1 to 4 times per year    Active Member of [employer] or Organizations: No    Attends Music therapist: 1 to 4 times per year    Marital Status: Married    Tobacco Counseling Counseling given: Not Answered   Clinical Intake:  Pre-visit preparation completed: Yes  Pain : No/denies pain     Nutritional Risks: None Diabetes: Yes CBG done?: No Did pt. bring in CBG monitor from home?: No  How often do you need to have someone help you when you read instructions, pamphlets, or other written materials from your doctor or pharmacy?: 1 - Never What is the last grade level you completed in school?: Master's Degree  Diabetic? yes  Interpreter Needed?: No  Information entered by :: Lisette Abu, LPN.  Activities of Daily Living    03/13/2022    3:04 PM  In your present state of health, do you have any difficulty performing the following activities:  Hearing? 1  Vision? 0  Difficulty concentrating or making decisions? 1  Walking or climbing stairs? 0  Dressing or bathing? 0  Doing errands, shopping? 0  Preparing Food and eating ? N  Using the Toilet? N  In the past six months, have you accidently leaked urine? Y  Comment wears protection  Do you have problems with loss of bowel control? N  Managing your Medications? N  Managing your Finances? N  Housekeeping or managing your Housekeeping? N    Patient Care Team: Biagio Borg, MD as PCP - Cristino Martes, MD as Consulting Physician (Dermatology)  Indicate any recent Medical Services you may have received from other than [facility] providers in the past year (date may be approximate).     Assessment:   This is a routine wellness examination for Trevone.  Hearing/Vision screen Hearing Screening - Comments:: Patient has hearing difficulty and wears hearing aids.   Vision Screening - Comments:: Patient does wear corrective lenses/contacts.  Eye exam done by:  [facility]    Dietary issues and exercise activities discussed: Current Exercise Habits: Structured exercise class;Home exercise routine (Active [employer] Member), Type of exercise: treadmill;stretching;walking;strength training/weights, Time (Minutes): 60, Frequency (Times/Week): 3, Weekly Exercise (Minutes/Week): 180, Intensity: Moderate   Goals Addressed             This Visit's Progress    No active goals at this time.        Depression Screen    03/13/2022    2:57 PM 06/24/2021   10:44 AM 11/21/2020   10:03 AM 06/14/2020    4:29 PM 09/20/2019   11:27 AM 09/15/2018    2:22 PM 03/15/2018    2:56 PM  PHQ 2/9 Scores  PHQ - 2 Score 0 0 0 0 0 1 0    Fall Risk    03/13/2022    2:53 PM 06/24/2021   10:44 AM 11/21/2020   10:03 AM 06/14/2020    4:29 PM 09/20/2019   11:27 AM  Fall Risk   Falls in the past year? 1 0 1 0 0  Number falls in past yr: 0 0 1 0   Injury with Fall? 0 0 1 0   Risk for fall due to :   Impaired balance/gait No Fall Risks   Follow up Falls evaluation completed   Falls evaluation completed     [place]:  Any stairs in or around the home? Yes  If so, are there any without handrails? No  Home free of loose throw rugs in walkways, pet beds, electrical cords, etc? Yes  Adequate lighting in your home to reduce risk of falls? Yes   ASSISTIVE DEVICES UTILIZED TO PREVENT FALLS:  Life alert? No  Use of a cane, walker or w/c? No  Grab bars in the bathroom? Yes  Shower chair or bench in shower? Yes  Elevated toilet seat or a handicapped toilet? Yes   TIMED UP AND GO:  Was the test performed? No .  Length of time to ambulate 10 feet: n/a sec.   Appearance of gait: Gait not evaluated during this visit.  Cognitive Function:        03/13/2022    3:06 PM  6CIT Screen  What Year? 0 points  What month? 0 points  What  time? 0 points  Count back from 20 0 points  Months in reverse 2 points  Repeat phrase 0 points  Total  Score 2 points    Immunizations Immunization History  Administered Date(s) Administered   Fluad Quad(high Dose 65+) 03/16/2019, 06/14/2020   Influenza, High Dose Seasonal PF 06/26/2016, 07/13/2017, 09/15/2018   Influenza,inj,Quad PF,6+ Mos 05/30/2015   PFIZER(Purple Top)SARS-COV-2 Vaccination 10/27/2019, 11/28/2019   Pneumococcal Conjugate-13 02/03/2013, 08/11/2013   Pneumococcal Polysaccharide-23 07/28/2012   Td 07/28/2001   Tetanus 02/03/2013    TDAP status: Up to date  Flu Vaccine status: Due, Education has been provided regarding the importance of this vaccine. Advised may receive this vaccine at local pharmacy or Health Dept. Aware to provide a copy of the vaccination record if obtained from local pharmacy or Health Dept. Verbalized acceptance and understanding.  Pneumococcal vaccine status: Up to date  Covid-19 vaccine status: Completed vaccines  Qualifies for Shingles Vaccine? Yes   Zostavax completed No   Shingrix Completed?: No.    Education has been provided regarding the importance of this vaccine. Patient has been advised to call insurance company to determine out of pocket expense if they have not yet received this vaccine. Advised may also receive vaccine at local pharmacy or Health Dept. Verbalized acceptance and understanding.  Screening Tests Health Maintenance  Topic Date Due   Zoster Vaccines- Shingrix (1 of 2) Never done   FOOT EXAM  10/17/2021   INFLUENZA VACCINE  02/25/2022   HEMOGLOBIN A1C  03/24/2022   OPHTHALMOLOGY EXAM  07/15/2022   Diabetic kidney evaluation - GFR measurement  09/24/2022   Diabetic kidney evaluation - Urine ACR  09/24/2022   TETANUS/TDAP  02/04/2023   Pneumonia Vaccine 89+ Years old  Completed   HPV VACCINES  Aged Out   COVID-19 Vaccine  Discontinued    Health Maintenance  Health Maintenance Due  Topic Date Due   Zoster Vaccines- Shingrix (1 of 2) Never done   FOOT EXAM  10/17/2021   INFLUENZA VACCINE  02/25/2022     Colorectal cancer screening: No longer required.   Lung Cancer Screening: (Low Dose CT Chest recommended if Age 8-80 years, 30 pack-year currently smoking OR have quit w/in 15years.) does not qualify.   Lung Cancer Screening Referral: no  Additional Screening:  Hepatitis C Screening: does not qualify; Completed no  Vision Screening: Recommended annual ophthalmology exams for early detection of glaucoma and other disorders of the eye. Is the patient up to date with their annual eye exam?  Yes  Who is the provider or what is the name of the office in which the patient attends annual eye exams? [facility] If pt is not established with a provider, would they like to be referred to a provider to establish care? No .   Dental Screening: Recommended annual dental exams for proper oral hygiene  Community Resource Referral / Chronic Care Management: CRR required this visit?  No   CCM required this visit?  No      Plan:     I have personally reviewed and noted the following in the patient's chart:   Medical and social history Use of alcohol, tobacco or illicit drugs  Current medications and supplements including opioid prescriptions. Patient is not currently taking opioid prescriptions. Functional ability and status Nutritional status Physical activity Advanced directives List of other physicians Hospitalizations, surgeries, and ER visits in previous 12 months Vitals Screenings to include cognitive, depression, and falls Referrals and appointments  In addition, I have reviewed  and discussed with patient certain preventive protocols, quality metrics, and best practice recommendations. A written personalized care plan for preventive services as well as general preventive health recommendations were provided to patient.     Mickeal Needy, LPN   9/60/4540   Nurse Notes:  There were no vitals filed for this visit. There is no height or weight on file to calculate  BMI. Patient stated that he has no issues with gait or balance; does not use any assistive devices.

## 2022-03-13 NOTE — Patient Instructions (Signed)
[name] , Thank you for taking time to come for your Medicare Wellness Visit. I appreciate your ongoing commitment to your health goals. Please review the following plan we discussed and let me know if I can assist you in the future.   Screening recommendations/referrals: Colonoscopy: Not a candidate for screening due to age Recommended yearly ophthalmology/optometry visit for glaucoma screening and checkup Recommended yearly dental visit for hygiene and checkup  Vaccinations: Influenza vaccine: due Fall 2023 Pneumococcal vaccine: 07/28/2012, 08/12/2019 Tdap vaccine: 02/03/2013; due every 10 years Shingles vaccine: never done    Covid-19: 10/27/2019, 11/28/2019  Advanced directives: No  Conditions/risks identified: Yes; Type II Diabetes  Next appointment: Please schedule your next Medicare Wellness Visit with your Nurse Health Advisor in 1 year by calling [phone].  Preventive Care 9 Years and Older, Male Preventive care refers to lifestyle choices and visits with your health care provider that can promote health and wellness. What does preventive care include? A yearly physical exam. This is also called an annual well check. Dental exams once or twice a year. Routine eye exams. Ask your health care provider how often you should have your eyes checked. Personal lifestyle choices, including: Daily care of your teeth and gums. Regular physical activity. Eating a healthy diet. Avoiding tobacco and drug use. Limiting alcohol use. Practicing safe sex. Taking low doses of aspirin every day. Taking vitamin and mineral supplements as recommended by your health care provider. What happens during an annual well check? The services and screenings done by your health care provider during your annual well check will depend on your age, overall health, lifestyle risk factors, and family history of disease. Counseling  Your health care provider may ask you questions about your: Alcohol  use. Tobacco use. Drug use. Emotional well-being. Home and relationship well-being. Sexual activity. Eating habits. History of falls. Memory and ability to understand (cognition). Work and work Astronomer. Screening  You may have the following tests or measurements: Height, weight, and BMI. Blood pressure. Lipid and cholesterol levels. These may be checked every 5 years, or more frequently if you are over 77 years old. Skin check. Lung cancer screening. You may have this screening every year starting at age 56 if you have a 30-pack-year history of smoking and currently smoke or have quit within the past 15 years. Fecal occult blood test (FOBT) of the stool. You may have this test every year starting at age 20. Flexible sigmoidoscopy or colonoscopy. You may have a sigmoidoscopy every 5 years or a colonoscopy every 10 years starting at age 22. Prostate cancer screening. Recommendations will vary depending on your family history and other risks. Hepatitis C blood test. Hepatitis B blood test. Sexually transmitted disease (STD) testing. Diabetes screening. This is done by checking your blood sugar (glucose) after you have not eaten for a while (fasting). You may have this done every 1-3 years. Abdominal aortic aneurysm (AAA) screening. You may need this if you are a current or former smoker. Osteoporosis. You may be screened starting at age 82 if you are at high risk. Talk with your health care provider about your test results, treatment options, and if necessary, the need for more tests. Vaccines  Your health care provider may recommend certain vaccines, such as: Influenza vaccine. This is recommended every year. Tetanus, diphtheria, and acellular pertussis (Tdap, Td) vaccine. You may need a Td booster every 10 years. Zoster vaccine. You may need this after age 25. Pneumococcal 13-valent conjugate (PCV13) vaccine. One dose is  recommended after age 27. Pneumococcal polysaccharide  (PPSV23) vaccine. One dose is recommended after age 24. Talk to your health care provider about which screenings and vaccines you need and how often you need them. This information is not intended to replace advice given to you by your health care provider. Make sure you discuss any questions you have with your health care provider. Document Released: 08/10/2015 Document Revised: 04/02/2016 Document Reviewed: 05/15/2015 Elsevier Interactive Patient Education  2017 [employer] Prevention in the Home Falls can cause injuries. They can happen to people of all ages. There are many things you can do to make your home safe and to help prevent falls. What can I do on the outside of my home? Regularly fix the edges of walkways and driveways and fix any cracks. Remove anything that might make you trip as you walk through a door, such as a raised step or threshold. Trim any bushes or trees on the path to your home. Use bright outdoor lighting. Clear any walking paths of anything that might make someone trip, such as rocks or tools. Regularly check to see if handrails are loose or broken. Make sure that both sides of any steps have handrails. Any raised decks and porches should have guardrails on the edges. Have any leaves, snow, or ice cleared regularly. Use sand or salt on walking paths during winter. Clean up any spills in your garage right away. This includes oil or grease spills. What can I do in the bathroom? Use night lights. Install grab bars by the toilet and in the tub and shower. Do not use towel bars as grab bars. Use non-skid mats or decals in the tub or shower. If you need to sit down in the shower, use a plastic, non-slip stool. Keep the floor dry. Clean up any water that spills on the floor as soon as it happens. Remove soap buildup in the tub or shower regularly. Attach bath mats securely with double-sided non-slip rug tape. Do not have throw rugs and other things on the  floor that can make you trip. What can I do in the bedroom? Use night lights. Make sure that you have a light by your bed that is easy to reach. Do not use any sheets or blankets that are too big for your bed. They should not hang down onto the floor. Have a firm chair that has side arms. You can use this for support while you get dressed. Do not have throw rugs and other things on the floor that can make you trip. What can I do in the kitchen? Clean up any spills right away. Avoid walking on wet floors. Keep items that you use a lot in easy-to-reach places. If you need to reach something above you, use a strong step stool that has a grab bar. Keep electrical cords out of the way. Do not use floor polish or wax that makes floors slippery. If you must use wax, use non-skid floor wax. Do not have throw rugs and other things on the floor that can make you trip. What can I do with my stairs? Do not leave any items on the stairs. Make sure that there are handrails on both sides of the stairs and use them. Fix handrails that are broken or loose. Make sure that handrails are as long as the stairways. Check any carpeting to make sure that it is firmly attached to the stairs. Fix any carpet that is loose or worn. Avoid having  throw rugs at the top or bottom of the stairs. If you do have throw rugs, attach them to the floor with carpet tape. Make sure that you have a light switch at the top of the stairs and the bottom of the stairs. If you do not have them, ask someone to add them for you. What else can I do to help prevent falls? Wear shoes that: Do not have high heels. Have rubber bottoms. Are comfortable and fit you well. Are closed at the toe. Do not wear sandals. If you use a stepladder: Make sure that it is fully opened. Do not climb a closed stepladder. Make sure that both sides of the stepladder are locked into place. Ask someone to hold it for you, if possible. Clearly mark and make  sure that you can see: Any grab bars or handrails. First and last steps. Where the edge of each step is. Use tools that help you move around (mobility aids) if they are needed. These include: Canes. Walkers. Scooters. Crutches. Turn on the lights when you go into a dark area. Replace any light bulbs as soon as they burn out. Set up your furniture so you have a clear path. Avoid moving your furniture around. If any of your floors are uneven, fix them. If there are any pets around you, be aware of where they are. Review your medicines with your doctor. Some medicines can make you feel dizzy. This can increase your chance of falling. Ask your doctor what other things that you can do to help prevent falls. This information is not intended to replace advice given to you by your health care provider. Make sure you discuss any questions you have with your health care provider. Document Released: 05/10/2009 Document Revised: 12/20/2015 Document Reviewed: 08/18/2014 Elsevier Interactive Patient Education  2017 [employer].

## 2022-03-17 ENCOUNTER — Ambulatory Visit: Payer: Medicare (Managed Care) | Admitting: Internal Medicine

## 2022-03-17 ENCOUNTER — Telehealth: Payer: Self-pay | Admitting: *Deleted

## 2022-03-17 VITALS — BP 130/78 | HR 79 | Temp 98.1°F | Ht 66.0 in | Wt 202.0 lb

## 2022-03-17 DIAGNOSIS — N182 Chronic kidney disease, stage 2 (mild): Secondary | ICD-10-CM | POA: Diagnosis not present

## 2022-03-17 DIAGNOSIS — E7800 Pure hypercholesterolemia, unspecified: Secondary | ICD-10-CM | POA: Diagnosis not present

## 2022-03-17 DIAGNOSIS — E039 Hypothyroidism, unspecified: Secondary | ICD-10-CM

## 2022-03-17 DIAGNOSIS — E559 Vitamin D deficiency, unspecified: Secondary | ICD-10-CM

## 2022-03-17 DIAGNOSIS — E1122 Type 2 diabetes mellitus with diabetic chronic kidney disease: Secondary | ICD-10-CM

## 2022-03-17 DIAGNOSIS — F32A Depression, unspecified: Secondary | ICD-10-CM

## 2022-03-17 DIAGNOSIS — I10 Essential (primary) hypertension: Secondary | ICD-10-CM

## 2022-03-17 LAB — BASIC METABOLIC PANEL
BUN: 16 mg/dL (ref 6–23)
CO2: 30 mEq/L (ref 19–32)
Calcium: 9.5 mg/dL (ref 8.4–10.5)
Chloride: 99 mEq/L (ref 96–112)
Creatinine, Ser: 1.09 mg/dL (ref 0.40–1.50)
GFR: 63.84 mL/min (ref >60.00)
Glucose, Bld: 140 mg/dL — ABNORMAL HIGH (ref 70–99)
Potassium: 4.8 mEq/L (ref 3.5–5.1)
Sodium: 141 mEq/L (ref 135–145)

## 2022-03-17 LAB — LIPID PANEL
Cholesterol: 137 mg/dL (ref 0–200)
HDL: 56.5 mg/dL (ref >39.00)
LDL Cholesterol: 61 mg/dL (ref 0–99)
NonHDL: 80.51
Total CHOL/HDL Ratio: 2
Triglycerides: 97 mg/dL (ref 0.0–149.0)
VLDL: 19.4 mg/dL (ref 0.0–40.0)

## 2022-03-17 LAB — HEPATIC FUNCTION PANEL
ALT: 16 U/L (ref 0–53)
AST: 19 U/L (ref 0–37)
Albumin: 4.2 g/dL (ref 3.5–5.2)
Alkaline Phosphatase: 64 U/L (ref 39–117)
Bilirubin, Direct: 0.2 mg/dL (ref 0.0–0.3)
Total Bilirubin: 1.1 mg/dL (ref 0.2–1.2)
Total Protein: 7.1 g/dL (ref 6.0–8.3)

## 2022-03-17 LAB — VITAMIN D 25 HYDROXY (VIT D DEFICIENCY, FRACTURES): VITD: >120 ng/mL

## 2022-03-17 LAB — TSH: TSH: 4.72 u[IU]/mL (ref 0.35–5.50)

## 2022-03-17 LAB — HEMOGLOBIN A1C: Hgb A1c MFr Bld: 7.2 % — ABNORMAL HIGH (ref 4.6–6.5)

## 2022-03-17 NOTE — Telephone Encounter (Signed)
CRITICAL VALUE STICKER  CRITICAL VALUE: Vitamin D greater than 120  RECEIVER (on-site recipient of call): Vincent Campbell   DATE & TIME NOTIFIED: 03/17/2022 @ 2:16pm  MESSENGER (representative from lab):[place] [facility] [place]  MD NOTIFIED: yes  TIME OF NOTIFICATION:2:17pm  RESPONSE:  awaiting Md response

## 2022-03-17 NOTE — Assessment & Plan Note (Signed)
Recent onset, mild to mod, declines tx or referral for now

## 2022-03-17 NOTE — Patient Instructions (Signed)
Please call if you change your mind about the medication for depression such as celexa 10 mg per dya  Please continue all other medications as before, and refills have been done if requested.  Please have the pharmacy call with any other refills you may need.  Please keep your appointments with your specialists as you may have planned  Please go to the LAB at the blood drawing area for the tests to be done  You will be contacted by phone if any changes need to be made immediately.  Otherwise, you will receive a letter about your results with an explanation, but please check with MyChart first.  Please remember to sign up for MyChart if you have not done so, as this will be important to you in the future with finding out test results, communicating by private email, and scheduling acute appointments online when needed.  Please make an Appointment to return in 6 months, or sooner if needed

## 2022-03-17 NOTE — Progress Notes (Unsigned)
Patient ID: Vincent Campbell, male   DOB: 10/18/1940, 81 y.o.   MRN: [id number]        Chief Complaint: follow up depression, dm, htn, hld, low thyroid       HPI:  Vincent Campbell is a 81 y.o. male here with c/o worsening mild depressive symptoms and irritability which he minimizes, denies SI or HI or significant worsening anxiety.  Pt denies chest pain, increased sob or doe, wheezing, orthopnea, PND, increased LE swelling, palpitations, dizziness or syncope.   Pt denies polydipsia, polyuria, or new focal neuro s/s.    Pt denies fever, wt loss, night sweats, loss of appetite, or other constitutional symptoms         Wt Readings from Last 3 Encounters:  03/17/22 202 lb (91.6 kg)  11/06/21 208 lb 9.6 oz (94.6 kg)  09/24/21 209 lb (94.8 kg)   BP Readings from Last 3 Encounters:  03/17/22 130/78  11/06/21 132/72  09/24/21 118/60         Past Medical History:  Diagnosis Date   BACK PAIN 01/12/2008   COLONIC POLYPS, HX OF 01/12/2008   Cough 05/28/2010   Encounter for well adult exam with abnormal findings 12/29/2010   HYPERLIPIDEMIA 01/12/2008   HYPERTENSION 01/12/2008   Hypothyroidism 09/15/2018   LEUKOCYTOSIS 03/02/2008   PERIPHERAL EDEMA 06/28/2010   SWELLING MASS OR LUMP IN HEAD AND NECK 05/28/2010   Tremor 08/02/2012   Past Surgical History:  Procedure Laterality Date   APPENDECTOMY     CATARACT EXTRACTION     CHOLECYSTECTOMY     TRANSCAROTID ARTERY REVASCULARIZATION  Right 09/26/2020   Procedure: RIGHT TRANSCAROTID ARTERY REVASCULARIZATION;  Surgeon: Maeola Harman, MD;  Location: [facility] OR;  Service: Vascular;  Laterality: Right;    reports that he quit smoking about 1 years ago. His smoking use included cigarettes. He has never used smokeless tobacco. He reports current alcohol use. He reports that he does not use drugs. family history includes Cancer in an other family member; Heart disease in an other family member; Hypertension in an other family member. No Known  Allergies Current Outpatient Medications on File Prior to Visit  Medication Sig Dispense Refill   clopidogrel (PLAVIX) 75 MG tablet Take 1 tablet (75 mg total) by mouth daily. 90 tablet 0   EUTHYROX 25 MCG tablet TAKE 1 TABLET BY MOUTH ONCE DAILY BEFORE BREAKFAST 90 tablet 3   furosemide (LASIX) 40 MG tablet Take 1 tablet (40 mg total) by mouth every other day. 45 tablet 3   Glucosamine 500 MG CAPS Take 500 mg by mouth daily.     hydrochlorothiazide (MICROZIDE) 12.5 MG capsule Take 1 capsule by mouth once daily 90 capsule 0   losartan (COZAAR) 25 MG tablet TAKE 4 TABLETS BY MOUTH ONCE DAILY 360 tablet 1   metFORMIN (GLUCOPHAGE-XR) 500 MG 24 hr tablet Take 3 tablets (1,500 mg total) by mouth daily with breakfast. 270 tablet 3   pantoprazole (PROTONIX) 40 MG tablet Take 1 tablet by mouth once daily 90 tablet 2   pravastatin (PRAVACHOL) 40 MG tablet Take 1 tablet (40 mg total) by mouth daily. 90 tablet 3   VITAMIN D PO Take 2 capsules by mouth daily.     ZINC CITRATE PO Take 1 tablet by mouth daily at 6 (six) AM.     No current facility-administered medications on file prior to visit.        ROS:  All others reviewed and negative.  Objective  PE:  BP 130/78 (BP Location: Right Arm, Patient Position: Sitting, Cuff Size: Large)   Pulse 79   Temp 98.1 F (36.7 C) (Oral)   Ht 5\' 6"  (1.676 m)   Wt 202 lb (91.6 kg)   SpO2 92%   BMI 32.60 kg/m                 Constitutional: Pt appears in NAD               HENT: Head: NCAT.                Right Ear: External ear normal.                 Left Ear: External ear normal.                Eyes: . Pupils are equal, round, and reactive to light. Conjunctivae and EOM are normal               Nose: without d/c or deformity               Neck: Neck supple. Gross normal ROM               Cardiovascular: Normal rate and regular rhythm.                 Pulmonary/Chest: Effort normal and breath sounds without rales or wheezing.                Abd:   Soft, NT, ND, + BS, no organomegaly               Neurological: Pt is alert. At baseline orientation, motor grossly intact               Skin: Skin is warm. No rashes, no other new lesions, LE edema - none               Psychiatric: Pt behavior is normal without agitation but depressed affect, irritable Micro: none  Cardiac tracings I have personally interpreted today:  none  Pertinent Radiological findings (summarize): none   Lab Results  Component Value Date   WBC 10.9 (H) 09/24/2021   HGB 14.0 09/24/2021   HCT 41.9 09/24/2021   PLT 279.0 09/24/2021   GLUCOSE 140 (H) 03/17/2022   CHOL 137 03/17/2022   TRIG 97.0 03/17/2022   HDL 56.50 03/17/2022   LDLDIRECT 116.7 05/28/2010   LDLCALC 61 03/17/2022   ALT 16 03/17/2022   AST 19 03/17/2022   NA 141 03/17/2022   K 4.8 03/17/2022   CL 99 03/17/2022   CREATININE 1.09 03/17/2022   BUN 16 03/17/2022   CO2 30 03/17/2022   TSH 4.72 03/17/2022   PSA 2.70 09/20/2019   INR 1.3 (H) 09/20/2020   HGBA1C 7.2 (H) 03/17/2022   MICROALBUR <0.7 09/24/2021   Assessment/Plan:  Vincent Campbell is a 81 y.o. White or Caucasian [1] male with  has a past medical history of BACK PAIN (01/12/2008), COLONIC POLYPS, HX OF (01/12/2008), Cough (05/28/2010), Encounter for well adult exam with abnormal findings (12/29/2010), HYPERLIPIDEMIA (01/12/2008), HYPERTENSION (01/12/2008), Hypothyroidism (09/15/2018), LEUKOCYTOSIS (03/02/2008), PERIPHERAL EDEMA (06/28/2010), SWELLING MASS OR LUMP IN HEAD AND NECK (05/28/2010), and Tremor (08/02/2012).  Depression Recent onset, mild to mod, declines tx or referral for now  CKD (chronic kidney disease), stage II Lab Results  Component Value Date   CREATININE 1.09 03/17/2022   Stable overall, cont to avoid nephrotoxins   Diabetes (HCC) Lab  Results  Component Value Date   HGBA1C 7.2 (H) 03/17/2022   Uncontrolled, but ok for age, pt to continue current medical treatment metformin ER 500 - 3 per day   Essential  hypertension BP Readings from Last 3 Encounters:  03/17/22 130/78  11/06/21 132/72  09/24/21 118/60   Stable, pt to continue medical treatment hct 12.5 qd, losartan 25 qd   Hyperlipidemia Lab Results  Component Value Date   LDLCALC 61 03/17/2022   Stable, pt to continue current statin pravastatin 40 mg qd   Hypothyroidism Lab Results  Component Value Date   TSH 4.72 03/17/2022   Stable, pt to continue levothyroxine 25 mcg qd  Followup: Return in about 6 months (around 09/17/2022).  Cathlean Cower, MD 03/20/2022 8:57 PM [facility]

## 2022-03-20 ENCOUNTER — Encounter: Payer: Self-pay | Admitting: Internal Medicine

## 2022-03-20 NOTE — Assessment & Plan Note (Signed)
Lab Results  Component Value Date   LDLCALC 61 03/17/2022   Stable, pt to continue current statin pravastatin 40 mg qd

## 2022-03-20 NOTE — Assessment & Plan Note (Signed)
Lab Results  Component Value Date   HGBA1C 7.2 (H) 03/17/2022   Uncontrolled, but ok for age, pt to continue current medical treatment metformin ER 500 - 3 per day

## 2022-03-20 NOTE — Assessment & Plan Note (Signed)
Lab Results  Component Value Date   CREATININE 1.09 03/17/2022   Stable overall, cont to avoid nephrotoxins

## 2022-03-20 NOTE — Assessment & Plan Note (Signed)
BP Readings from Last 3 Encounters:  03/17/22 130/78  11/06/21 132/72  09/24/21 118/60   Stable, pt to continue medical treatment hct 12.5 qd, losartan 25 qd

## 2022-03-20 NOTE — Assessment & Plan Note (Signed)
Lab Results  Component Value Date   TSH 4.72 03/17/2022   Stable, pt to continue levothyroxine 25 mcg qd

## 2022-03-21 ENCOUNTER — Ambulatory Visit: Payer: Self-pay | Admitting: Licensed Clinical Social Worker

## 2022-03-21 NOTE — Patient Instructions (Signed)
Visit Information  Thank you for taking time to visit with me today. Please don't hesitate to contact me if I can be of assistance to you before our next scheduled telephone appointment.  Following are the goals we discussed today:   No further intervention needed at present  Please call the care guide team at [phone] if you need to cancel or reschedule your appointment.   If you are experiencing a Mental Health or Behavioral Health Crisis or need someone to talk to, please go to [facility] [address], [place] [phone])   Following is a copy of your full plan of care:   Care Coordination Interventions:  Active listening / Reflection utilized  Informed Cree Kunert, spouse of  client, about Care Coordination program support for client Discussed client needs Discussed client support with PCP Discussed client tremor issue. Client is concerned over tremor issues. Client plans to talk with PCP,  Dr. Jonny Ruiz on next visit about tremor issue of client  Mr. Markson was given information about Care Management services by the embedded care coordination team including:  Care Management services include personalized support from designated clinical staff supervised by his physician, including individualized plan of care and coordination with other care providers 24/7 contact phone numbers for assistance for urgent and routine care needs. The patient may stop CCM services at any time (effective at the end of the month) by phone call to the office staff.  Patient agreed to services and verbal consent obtained.   Kelton Pillar.Carvin Almas MSW, LCSW Licensed Visual merchandiser [facility] Care Management [phone]

## 2022-03-21 NOTE — Patient Outreach (Signed)
  Care Coordination   Initial Visit Note   03/21/2022 Name: Vincent Campbell MRN: [id number] DOB: 07-07-1941  Vincent Campbell is a 81 y.o. year old male who sees Corwin Levins, MD for primary care. I spoke with  Vincent Campbell / spouse of client, Vincent Campbell, by phone today.  What matters to the patients health and wellness today? Tremor issue    Goals Addressed             This Visit's Progress    Patient has tremor issue of concern. He will talk with PCP about this issues       Care Coordination Interventions:   Active listening / Reflection utilized  Informed Vincent Campbell, spouse of  client, about Care Coordination program support for client Discussed client needs Discussed client support with PCP Discussed client tremor issue. Client is concerned over tremor issues. Client plans to talk with PCP,  Dr. Jonny Ruiz on next visit about tremor issue of client        SDOH assessments and interventions completed:  No     Care Coordination Interventions Activated:  No  Care Coordination Interventions:  No, not indicated   Follow up plan: No further intervention required.   Encounter Outcome:  Pt. Visit Completed

## 2022-06-03 ENCOUNTER — Other Ambulatory Visit: Payer: Self-pay | Admitting: Internal Medicine

## 2022-06-16 ENCOUNTER — Other Ambulatory Visit: Payer: Self-pay | Admitting: Internal Medicine

## 2022-06-16 ENCOUNTER — Telehealth: Payer: Self-pay | Admitting: Internal Medicine

## 2022-06-16 NOTE — Telephone Encounter (Signed)
Please refill as per office routine med refill policy (all routine meds to be refilled for 3 mo or monthly (per pt preference) up to one year from last visit, then month to month grace period for 3 mo, then further med refills will have to be denied) ? ?

## 2022-06-16 NOTE — Telephone Encounter (Signed)
Caller & Relationship to patient: Wife  Call back number: [phone]   Date of last office visit: 08.21.23  Date of next office visit: 2.20.24  Medication(s) to be refilled:  pravastatin (PRAVACHOL) 40 MG tablet   Preferred Pharmacy:  [id number]   Phone: [phone]  Fax: [phone]

## 2022-06-17 NOTE — Telephone Encounter (Signed)
Rx request sent to pharmacy.  

## 2022-08-29 ENCOUNTER — Telehealth: Payer: Self-pay | Admitting: Internal Medicine

## 2022-08-29 ENCOUNTER — Other Ambulatory Visit: Payer: Self-pay

## 2022-08-29 ENCOUNTER — Other Ambulatory Visit: Payer: Self-pay | Admitting: Internal Medicine

## 2022-08-29 MED ORDER — PANTOPRAZOLE SODIUM 40 MG PO TBEC: 40.0000 mg | DELAYED_RELEASE_TABLET | Freq: Every day | ORAL | 1 refills | Status: DC

## 2022-08-29 NOTE — Telephone Encounter (Signed)
Pt called requesting Rx refill for pantoprazole (PROTONIX) 40 MG tablet.   pharmacy advised pt he was out of refills

## 2022-08-29 NOTE — Telephone Encounter (Signed)
Please refill as per office routine med refill policy (all routine meds to be refilled for 3 mo or monthly (per pt preference) up to one year from last visit, then month to month grace period for 3 mo, then further med refills will have to be denied) ? ?

## 2022-08-29 NOTE — Telephone Encounter (Signed)
Mediation send in to pt pharmacy

## 2022-08-30 IMAGING — CT CT ANGIO NECK
1 of 12 series · 5 of 33 positions shown · IV contrast (OMNI 350)
Comparison: 09/23/2020 and prior.

CLINICAL DATA: Stroke/TIA, assess intracranial arteries



[Series 8: cta neck thins · axial · 0.50mm/px · z∈[-249,-8]mm · 5 of 908 slices shown]
[im 152/908  soft-tissue]
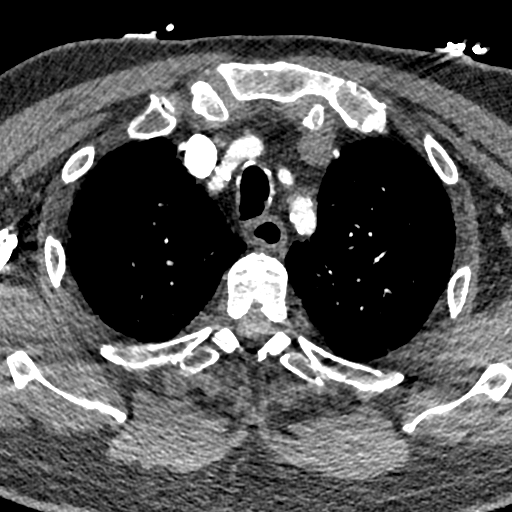
[im 303/908  bone]
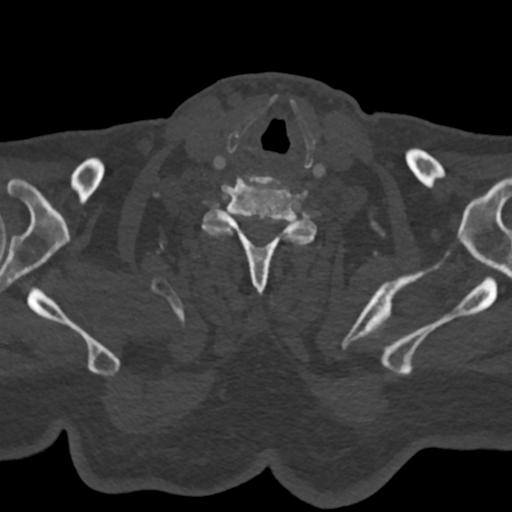
[im 454/908  soft-tissue]
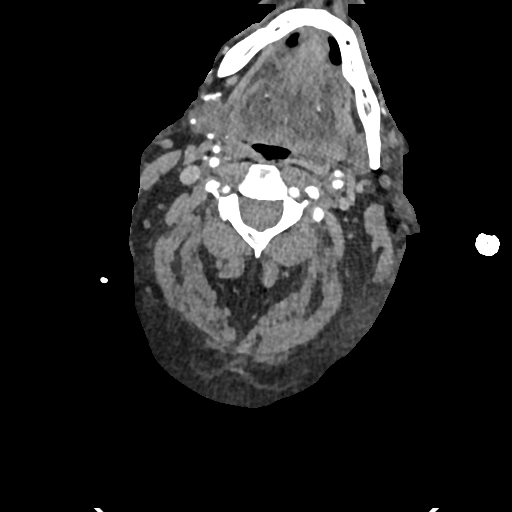
[im 605/908  bone]
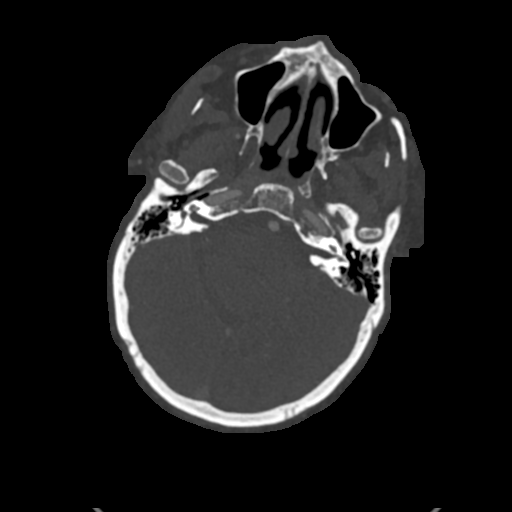
[im 756/908  soft-tissue]
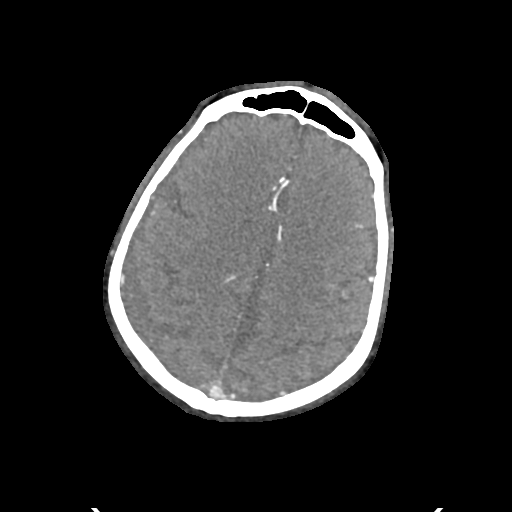

[5 of 33 positions shown; findings below may reference images not displayed]

FINDINGS: CT HEAD FINDINGS

Brain: Scattered small acute infarcts involving the right cerebrum,
basal ganglia and anterior corpus callosum are better demonstrated
on same day MRI. No mass lesion. No midline shift, ventriculomegaly
or extra-axial fluid collection. Mild cerebral atrophy with ex vacuo
dilatation and chronic microvascular ischemic changes. Chronic left
corona radiata and right basal ganglia lacunar insults.

Vascular: No hyperdense vessel or unexpected calcification.

Skull: Negative for fracture or focal lesion.

Sinuses/Orbits: Normal orbits. Small left sphenoid sinus mucous
retention cyst. No mastoid effusion.

Other: None.

Review of the MIP images confirms the above findings

CTA NECK FINDINGS

Aortic arch: Standard branching. Patent great vessel origins. No
evidence of dissection. Aortic arch atheromatous disease with plaque
ulceration along the inferior transverse segment.

Right carotid system: Patent CCA. Bifurcation atheromatous disease
with at least 90% proximal ICA narrowing.

Left carotid system: Patent. Bifurcation atheromatous disease with
less than 50% proximal ICA narrowing.

Vertebral arteries: Dominant left vertebral artery.  Patent.

Skeleton: No acute finding. Chronic left rib deformity. Multilevel
spondylosis.

Other neck: No adenopathy.  No soft tissue mass.

Upper chest: Upper lung atelectasis.

Review of the MIP images confirms the above findings

CTA HEAD FINDINGS

Anterior circulation: Bilateral carotid siphon atherosclerotic
calcifications with mild right supraclinoid ICA narrowing. Patent
ACAs and MCAs.

Posterior circulation: Mild-to-moderate right V4 segment narrowing.
Patent basilar and superior cerebellar arteries. Patent PICA. Patent
bilateral PCAs with mild narrowing and irregularity on the left.

Venous sinuses: As permitted by contrast timing, patent.

Anatomic variants: None.

Review of the MIP images confirms the above findings
IMPRESSION: Head CT:

Multifocal small right cerebrum infarcts are better demonstrated on
same day MRI.

Chronic right basal ganglia and left corona radiata lacunar insults.

CTA neck:

At least 90% proximal right ICA narrowing. Less than 50% proximal
left ICA narrowing.

CTA head:

Mild to moderate right V4 segment narrowing.

Mild right supraclinoid ICA and left PCA narrowing.

These results will be called to the ordering clinician or
representative by the Radiologist Assistant, and communication
documented in the PACS or [REDACTED].

## 2022-08-30 IMAGING — DX DG CHEST 1V PORT
1 series · 1 of 1 positions shown · non-contrast
Comparison: Chest x-ray 09/20/2020.

CLINICAL DATA: 79-year-old male with history of confusion and
shortness of breath.

EXAM:
PORTABLE CHEST 1 VIEW

[chest ap]
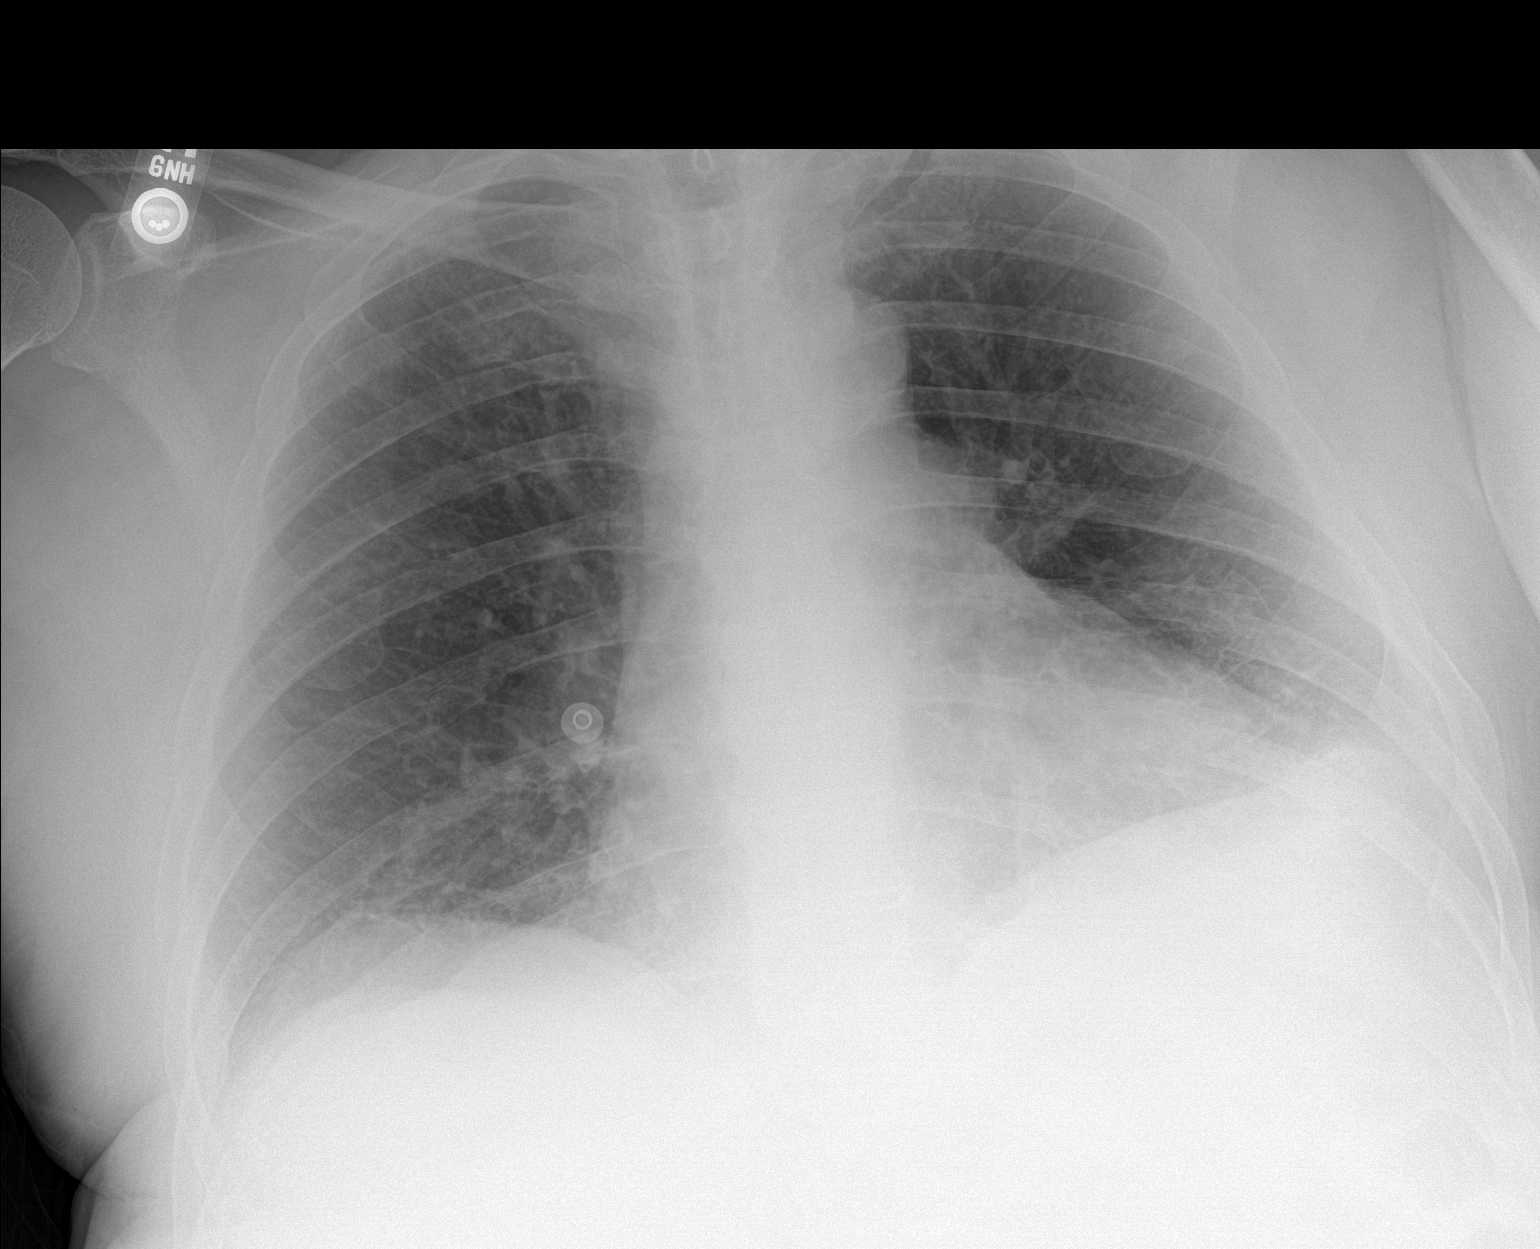

[1 of 1 positions shown; findings below may reference images not displayed]

FINDINGS: Lung volumes are low. No consolidative airspace disease. No pleural
effusions. No pneumothorax. No pulmonary nodule or mass noted.
Pulmonary vasculature and the cardiomediastinal silhouette are
within normal limits.
IMPRESSION: 1. Low lung volumes without radiographic evidence of acute
cardiopulmonary disease.

## 2022-08-30 IMAGING — MR MR HEAD W/O CM
11 series · 48 of 48 positions shown · non-contrast
Comparison: 09/21/2020 and prior.

CLINICAL DATA: Mental status change, unknown cause

EXAM:
MRI HEAD WITHOUT CONTRAST
TECHNIQUE: Multiplanar, multiecho pulse sequences of the brain and surrounding
structures were obtained without intravenous contrast.

[Series 5: DWI · oblique · 3.0mm · 1.36mm/px · 8 of 115 slices shown (1 of 4)]
[im 1/115]
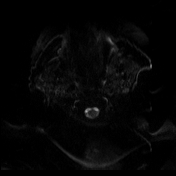
[im 17/115]
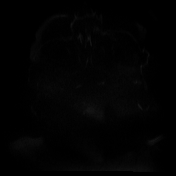
[im 33/115]
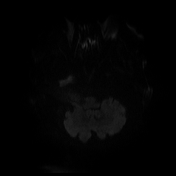
[im 49/115]
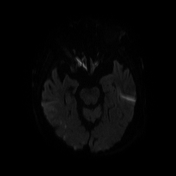
[im 66/115]
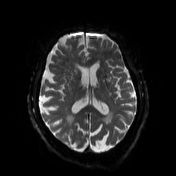
[im 82/115]
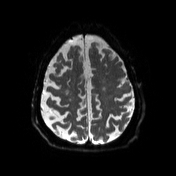
[im 98/115]
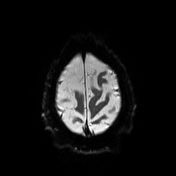
[im 115/115]
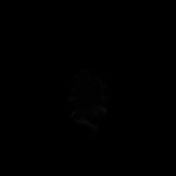

[Series 6: DWI · oblique · 3.0mm · 1.36mm/px · 4 of 57 slices shown (2 of 4)]
[im 1/57]
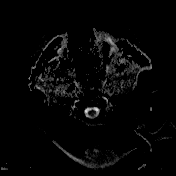
[im 19/57]
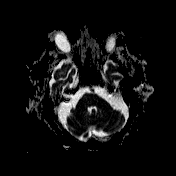
[im 38/57]
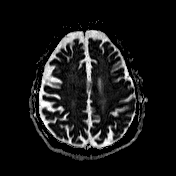
[im 57/57]
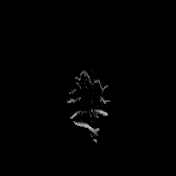

[Series 7: mip_images(sw) · axial · 32.0mm · 0.72mm/px · z∈[-9,+114]mm · 3 of 37 slices shown]
[im 1/37]
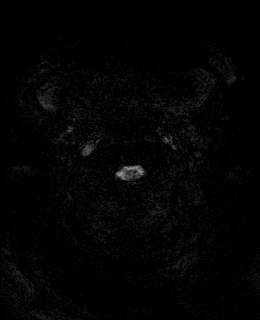
[im 19/37]
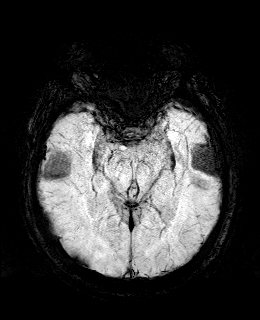
[im 37/37]
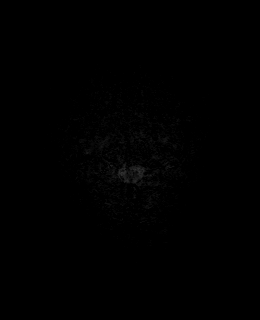

[Series 8: swi_images · axial · 4.0mm · 0.72mm/px · z∈[-21,+126]mm · 3 of 44 slices shown]
[im 1/44]
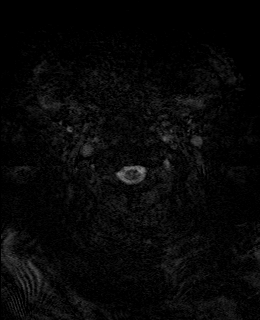
[im 22/44]
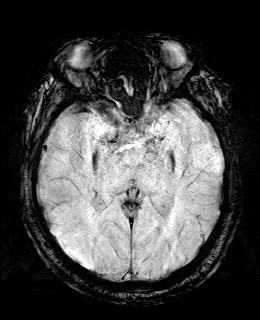
[im 44/44]
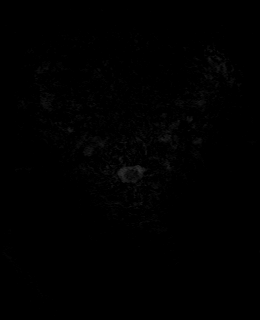

[Series 9: FLAIR · axial · 4.0mm · 0.72mm/px · z∈[-21,+126]mm · 3 of 44 slices shown]
[im 1/44]
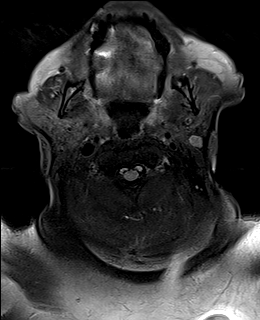
[im 22/44]
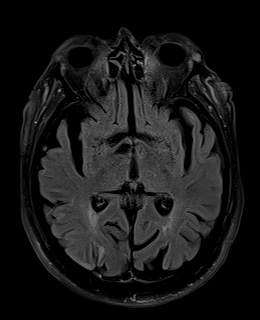
[im 44/44]
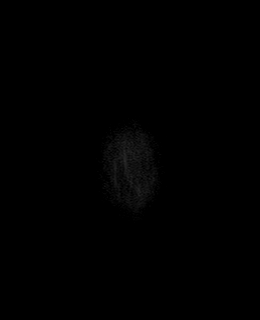

[Series 10: DWI · coronal · 5.0mm · 1.31mm/px · 5 of 76 slices shown (3 of 4)]
[im 1/76]
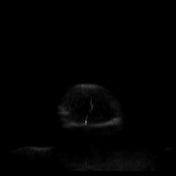
[im 19/76]
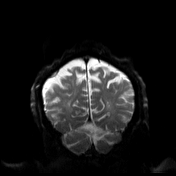
[im 38/76]
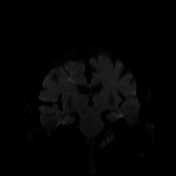
[im 57/76]
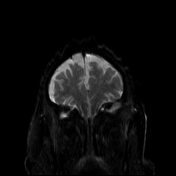
[im 76/76]
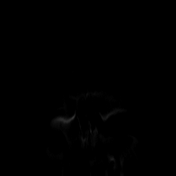

[Series 11: DWI · coronal · 5.0mm · 1.31mm/px · 3 of 38 slices shown (4 of 4)]
[im 1/38]
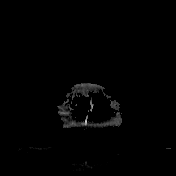
[im 19/38]
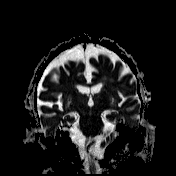
[im 38/38]
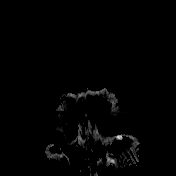

[Series 12: T1 · sagittal · 5.0mm · 0.75mm/px · 2 of 27 slices shown (1 of 2)]
[im 1/27]
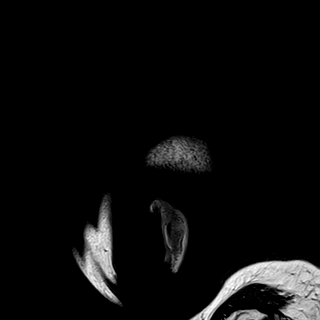
[im 27/27]
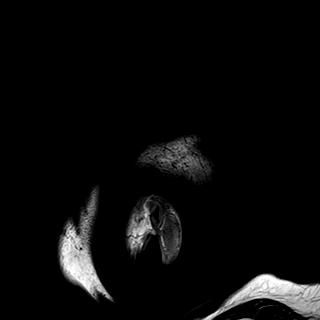

[Series 13: T2 · oblique · 5.0mm · 0.60mm/px · 2 of 26 slices shown (1 of 2)]
[im 1/26]
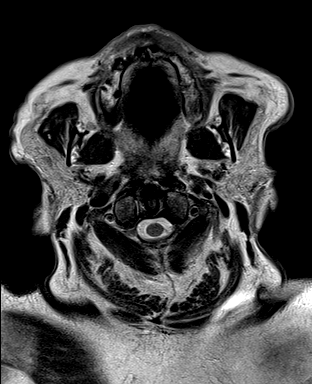
[im 26/26]
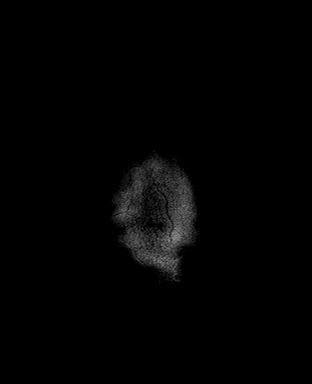

[Series 14: T1 · oblique · 1.0mm · 0.90mm/px · 12 of 176 slices shown (2 of 2)]
[im 1/176]
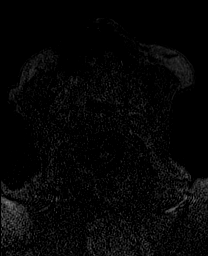
[im 16/176]
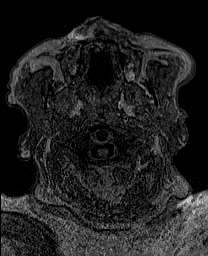
[im 32/176]
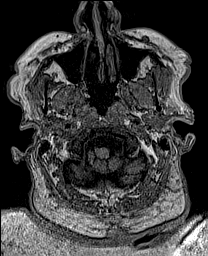
[im 48/176]
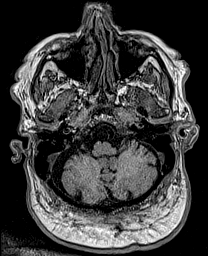
[im 64/176]
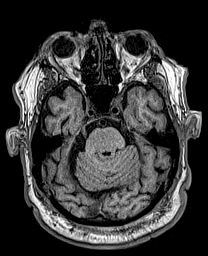
[im 80/176]
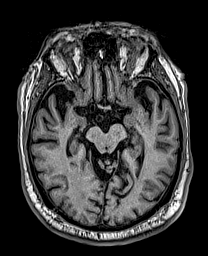
[im 96/176]
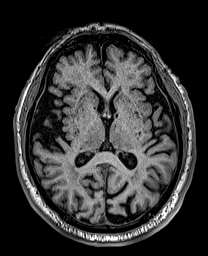
[im 112/176]
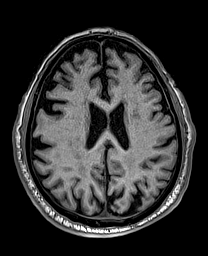
[im 128/176]
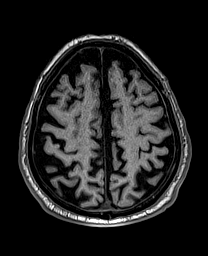
[im 144/176]
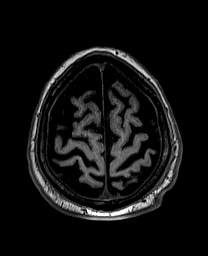
[im 160/176]
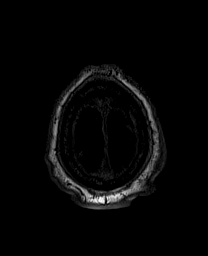
[im 176/176]
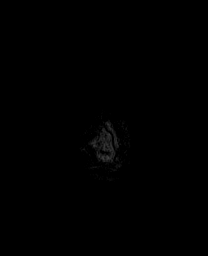

[Series 15: T2 · coronal · 5.0mm · 0.57mm/px · 3 of 38 slices shown (2 of 2)]
[im 1/38]
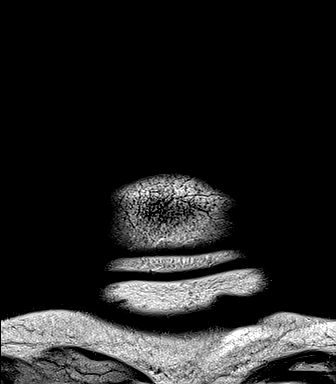
[im 19/38]
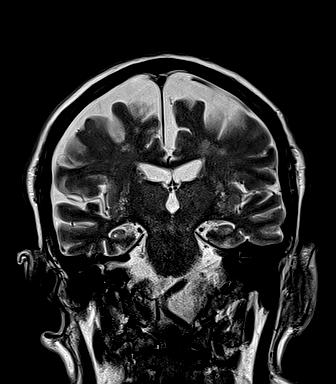
[im 38/38]
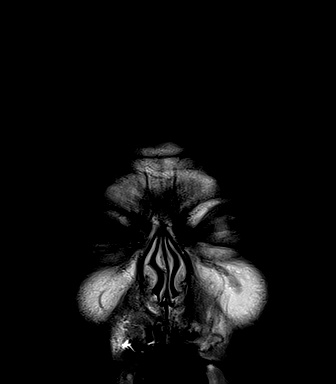

[48 of 48 positions shown; findings below may reference images not displayed]

FINDINGS: Brain: Scattered small DWI hyperintensities predominantly involving
the right cerebral watershed territories, anterior corpus callosum
and right basal ganglia. No intracranial hemorrhage. No midline
shift, ventriculomegaly or extra-axial fluid collection. No mass
lesion. Mild cerebral atrophy with ex vacuo dilatation. Mild chronic
microvascular ischemic changes. Chronic right basal ganglia and left
corona radiata lacunar insults.

Vascular: Major intracranial flow voids are preserved at the skull
base.

Skull and upper cervical spine: Normal marrow signal.

Sinuses/Orbits: Sequela of bilateral lens replacement. Clear
paranasal sinuses and mastoid air cells.

Other: None.
IMPRESSION: Scattered small subacute infarcts involving the right cerebrum,
right basal ganglia and anterior corpus callosum. Consider CTA head
and neck.

Chronic right basal ganglia and left corona radiata lacunar insults.

Mild chronic microvascular ischemic changes.

These results were called by telephone at the time of interpretation
on 09/23/2020 at [DATE] to provider JHONJANER RODRIGUE , who verbally
acknowledged these results.

## 2022-09-16 ENCOUNTER — Ambulatory Visit (INDEPENDENT_AMBULATORY_CARE_PROVIDER_SITE_OTHER): Payer: Medicare (Managed Care) | Admitting: Internal Medicine

## 2022-09-16 VITALS — BP 120/64 | HR 74 | Temp 99.1°F | Ht 66.0 in | Wt 205.0 lb

## 2022-09-16 DIAGNOSIS — I10 Essential (primary) hypertension: Secondary | ICD-10-CM

## 2022-09-16 DIAGNOSIS — E039 Hypothyroidism, unspecified: Secondary | ICD-10-CM | POA: Diagnosis not present

## 2022-09-16 DIAGNOSIS — E7800 Pure hypercholesterolemia, unspecified: Secondary | ICD-10-CM

## 2022-09-16 DIAGNOSIS — E538 Deficiency of other specified B group vitamins: Secondary | ICD-10-CM

## 2022-09-16 DIAGNOSIS — E1122 Type 2 diabetes mellitus with diabetic chronic kidney disease: Secondary | ICD-10-CM | POA: Diagnosis not present

## 2022-09-16 DIAGNOSIS — E559 Vitamin D deficiency, unspecified: Secondary | ICD-10-CM | POA: Diagnosis not present

## 2022-09-16 DIAGNOSIS — N182 Chronic kidney disease, stage 2 (mild): Secondary | ICD-10-CM | POA: Diagnosis not present

## 2022-09-16 DIAGNOSIS — Z0001 Encounter for general adult medical examination with abnormal findings: Secondary | ICD-10-CM | POA: Diagnosis not present

## 2022-09-16 LAB — MICROALBUMIN / CREATININE URINE RATIO
Creatinine,U: 118.9 mg/dL
Microalb Creat Ratio: 0.7 mg/g (ref 0.0–30.0)
Microalb, Ur: 0.8 mg/dL (ref 0.0–1.9)

## 2022-09-16 LAB — TSH: TSH: 5.25 u[IU]/mL (ref 0.35–5.50)

## 2022-09-16 LAB — BASIC METABOLIC PANEL WITH GFR
BUN: 18 mg/dL (ref 6–23)
CO2: 28 meq/L (ref 19–32)
Calcium: 9.7 mg/dL (ref 8.4–10.5)
Chloride: 101 meq/L (ref 96–112)
Creatinine, Ser: 1.11 mg/dL (ref 0.40–1.50)
GFR: 62.25 mL/min
Glucose, Bld: 150 mg/dL — ABNORMAL HIGH (ref 70–99)
Potassium: 3.8 meq/L (ref 3.5–5.1)
Sodium: 138 meq/L (ref 135–145)

## 2022-09-16 LAB — LIPID PANEL
Cholesterol: 151 mg/dL (ref 0–200)
HDL: 59.3 mg/dL (ref >39.00)
LDL Cholesterol: 64 mg/dL (ref 0–99)
NonHDL: 91.29
Total CHOL/HDL Ratio: 3
Triglycerides: 136 mg/dL (ref 0.0–149.0)
VLDL: 27.2 mg/dL (ref 0.0–40.0)

## 2022-09-16 LAB — CBC WITH DIFFERENTIAL/PLATELET
Basophils Absolute: 0.1 10*3/uL (ref 0.0–0.1)
Basophils Relative: 0.5 % (ref 0.0–3.0)
Eosinophils Absolute: 0.2 10*3/uL (ref 0.0–0.7)
Eosinophils Relative: 2.4 % (ref 0.0–5.0)
HCT: 42.1 % (ref 39.0–52.0)
Hemoglobin: 13.8 g/dL (ref 13.0–17.0)
Lymphocytes Relative: 23.9 % (ref 12.0–46.0)
Lymphs Abs: 2.2 10*3/uL (ref 0.7–4.0)
MCHC: 32.7 g/dL (ref 30.0–36.0)
MCV: 98.4 fl (ref 78.0–100.0)
Monocytes Absolute: 1.4 10*3/uL — ABNORMAL HIGH (ref 0.1–1.0)
Monocytes Relative: 15.4 % — ABNORMAL HIGH (ref 3.0–12.0)
Neutro Abs: 5.3 10*3/uL (ref 1.4–7.7)
Neutrophils Relative %: 57.8 % (ref 43.0–77.0)
Platelets: 258 10*3/uL (ref 150.0–400.0)
RBC: 4.28 Mil/uL (ref 4.22–5.81)
RDW: 14.1 % (ref 11.5–15.5)
WBC: 9.2 10*3/uL (ref 4.0–10.5)

## 2022-09-16 LAB — HEPATIC FUNCTION PANEL
ALT: 14 U/L (ref 0–53)
AST: 18 U/L (ref 0–37)
Albumin: 4.3 g/dL (ref 3.5–5.2)
Alkaline Phosphatase: 61 U/L (ref 39–117)
Bilirubin, Direct: 0.2 mg/dL (ref 0.0–0.3)
Total Bilirubin: 0.8 mg/dL (ref 0.2–1.2)
Total Protein: 7.4 g/dL (ref 6.0–8.3)

## 2022-09-16 LAB — URINALYSIS, ROUTINE W REFLEX MICROSCOPIC
Bilirubin Urine: NEGATIVE
Hgb urine dipstick: NEGATIVE
Ketones, ur: NEGATIVE
Leukocytes,Ua: NEGATIVE
Nitrite: NEGATIVE
RBC / HPF: NONE SEEN (ref 0–?)
Specific Gravity, Urine: >=1.03 — AB (ref 1.000–1.030)
Total Protein, Urine: NEGATIVE
Urine Glucose: NEGATIVE
Urobilinogen, UA: 0.2 (ref 0.0–1.0)
pH: 5.5 (ref 5.0–8.0)

## 2022-09-16 LAB — VITAMIN B12: Vitamin B-12: 814 pg/mL (ref 211–911)

## 2022-09-16 LAB — VITAMIN D 25 HYDROXY (VIT D DEFICIENCY, FRACTURES): VITD: 89.19 ng/mL (ref 30.00–100.00)

## 2022-09-16 LAB — HEMOGLOBIN A1C: Hgb A1c MFr Bld: 7.6 % — ABNORMAL HIGH (ref 4.6–6.5)

## 2022-09-16 NOTE — Progress Notes (Signed)
Patient ID: Vincent Campbell, male   DOB: November 09, 1940, 82 y.o.   MRN: [id number]         Chief Complaint:: wellness exam and ckd 2, dm, htn, hld, low thyroid       HPI:  Vincent Campbell is a 82 y.o. male here for wellness exam; plans to call  for eye appt soon; declines flu shot, but for shingrix at pharmacy, o/w up to date                Also Pt denies chest pain, increased sob or doe, wheezing, orthopnea, PND, increased LE swelling, palpitations, dizziness or syncope.   Pt denies polydipsia, polyuria, or new focal neuro s/s.    Pt denies fever, wt loss, night sweats, loss of appetite, or other constitutional symptoms  Denies hyper or hypo thyroid symptoms such as voice, skin or hair change.     Wt Readings from Last 3 Encounters:  09/16/22 205 lb (93 kg)  03/17/22 202 lb (91.6 kg)  11/06/21 208 lb 9.6 oz (94.6 kg)   BP Readings from Last 3 Encounters:  09/16/22 120/64  03/17/22 130/78  11/06/21 132/72   Immunization History  Administered Date(s) Administered   Fluad Quad(high Dose 65+) 03/16/2019, 06/14/2020   Influenza, High Dose Seasonal PF 06/26/2016, 07/13/2017, 09/15/2018   Influenza,inj,Quad PF,6+ Mos 05/30/2015   PFIZER(Purple Top)SARS-COV-2 Vaccination 10/27/2019, 11/28/2019   Pneumococcal Conjugate-13 02/03/2013, 08/11/2013   Pneumococcal Polysaccharide-23 07/28/2012   Td 07/28/2001   Tetanus 02/03/2013   There are no preventive care reminders to display for this patient.     Past Medical History:  Diagnosis Date   BACK PAIN 01/12/2008   COLONIC POLYPS, HX OF 01/12/2008   Cough 05/28/2010   Encounter for well adult exam with abnormal findings 12/29/2010   HYPERLIPIDEMIA 01/12/2008   HYPERTENSION 01/12/2008   Hypothyroidism 09/15/2018   LEUKOCYTOSIS 03/02/2008   PERIPHERAL EDEMA 06/28/2010   SWELLING MASS OR LUMP IN HEAD AND NECK 05/28/2010   Tremor 08/02/2012   Past Surgical History:  Procedure Laterality Date   APPENDECTOMY     CATARACT EXTRACTION     CHOLECYSTECTOMY      TRANSCAROTID ARTERY REVASCULARIZATION  Right 09/26/2020   Procedure: RIGHT TRANSCAROTID ARTERY REVASCULARIZATION;  Surgeon: Waynetta Sandy, MD;  Location: [facility];  Service: Vascular;  Laterality: Right;    reports that he quit smoking about 2 years ago. His smoking use included cigarettes. He has never used smokeless tobacco. He reports current alcohol use. He reports that he does not use drugs. family history includes Cancer in an other family member; Heart disease in an other family member; Hypertension in an other family member. No Known Allergies Current Outpatient Medications on File Prior to Visit  Medication Sig Dispense Refill   clopidogrel (PLAVIX) 75 MG tablet Take 1 tablet by mouth once daily 90 tablet 2   EUTHYROX 25 MCG tablet TAKE 1 TABLET BY MOUTH ONCE DAILY BEFORE BREAKFAST 90 tablet 3   furosemide (LASIX) 40 MG tablet Take 1 tablet (40 mg total) by mouth every other day. 45 tablet 3   Glucosamine 500 MG CAPS Take 500 mg by mouth daily.     hydrochlorothiazide (MICROZIDE) 12.5 MG capsule Take 1 capsule by mouth once daily 90 capsule 0   losartan (COZAAR) 25 MG tablet TAKE 4 TABLETS BY MOUTH ONCE DAILY 360 tablet 1   metFORMIN (GLUCOPHAGE-XR) 500 MG 24 hr tablet Take 3 tablets (1,500 mg total) by mouth daily with breakfast. 270 tablet  3   pantoprazole (PROTONIX) 40 MG tablet Take 1 tablet (40 mg total) by mouth daily. 90 tablet 1   pravastatin (PRAVACHOL) 40 MG tablet Take 1 tablet by mouth once daily 90 tablet 0   VITAMIN D PO Take 2 capsules by mouth daily.     ZINC CITRATE PO Take 1 tablet by mouth daily at 6 (six) AM.     No current facility-administered medications on file prior to visit.        ROS:  All others reviewed and negative.  Objective        PE:  BP 120/64 (BP Location: Right Arm, Patient Position: Sitting, Cuff Size: Large)   Pulse 74   Temp 99.1 F (37.3 C) (Oral)   Ht '5\' 6"'$  (1.676 m)   Wt 205 lb (93 kg)   SpO2 92%   BMI 33.09 kg/m                  Constitutional: Pt appears in NAD               HENT: Head: NCAT.                Right Ear: External ear normal.                 Left Ear: External ear normal.                Eyes: . Pupils are equal, round, and reactive to light. Conjunctivae and EOM are normal               Nose: without d/c or deformity               Neck: Neck supple. Gross normal ROM               Cardiovascular: Normal rate and regular rhythm.                 Pulmonary/Chest: Effort normal and breath sounds without rales or wheezing.                Abd:  Soft, NT, ND, + BS, no organomegaly               Neurological: Pt is alert. At baseline orientation, motor grossly intact               Skin: Skin is warm. No rashes, no other new lesions, LE edema - none               Psychiatric: Pt behavior is normal without agitation   Micro: none  Cardiac tracings I have personally interpreted today:  none  Pertinent Radiological findings (summarize): none   Lab Results  Component Value Date   WBC 9.2 09/16/2022   HGB 13.8 09/16/2022   HCT 42.1 09/16/2022   PLT 258.0 09/16/2022   GLUCOSE 150 (H) 09/16/2022   CHOL 151 09/16/2022   TRIG 136.0 09/16/2022   HDL 59.30 09/16/2022   LDLDIRECT 116.7 05/28/2010   LDLCALC 64 09/16/2022   ALT 14 09/16/2022   AST 18 09/16/2022   NA 138 09/16/2022   K 3.8 09/16/2022   CL 101 09/16/2022   CREATININE 1.11 09/16/2022   BUN 18 09/16/2022   CO2 28 09/16/2022   TSH 5.25 09/16/2022   PSA 2.70 09/20/2019   INR 1.3 (H) 09/20/2020   HGBA1C 7.6 (H) 09/16/2022   MICROALBUR 0.8 09/16/2022   Assessment/Plan:  Vincent Campbell is a 82 y.o. White  or Caucasian [1] male with  has a past medical history of BACK PAIN (01/12/2008), COLONIC POLYPS, HX OF (01/12/2008), Cough (05/28/2010), Encounter for well adult exam with abnormal findings (12/29/2010), HYPERLIPIDEMIA (01/12/2008), HYPERTENSION (01/12/2008), Hypothyroidism (09/15/2018), LEUKOCYTOSIS (03/02/2008), PERIPHERAL EDEMA (06/28/2010),  SWELLING MASS OR LUMP IN HEAD AND NECK (05/28/2010), and Tremor (08/02/2012).  Encounter for well adult exam with abnormal findings Age and sex appropriate education and counseling updated with regular exercise and diet Referrals for preventative services - pt to call for eye exam soon Immunizations addressed - declines flu shot, for shingrx at pharmacy Smoking counseling  - none needed Evidence for depression or other mood disorder - none significant Most recent labs reviewed. I have personally reviewed and have noted: 1) the patient's medical and social history 2) The patient's current medications and supplements 3) The patient's height, weight, and BMI have been recorded in the chart   CKD (chronic kidney disease), stage II Lab Results  Component Value Date   CREATININE 1.11 09/16/2022   Stable overall, cont to avoid nephrotoxins   Diabetes ([place]) Lab Results  Component Value Date   HGBA1C 7.6 (H) 09/16/2022   Uncontrolled, goal A1c < 7.5 for age, pt to continue current medical treatment metfomrin ER 500 g - 3 qd declines change for now except to work on DM diet, wt control   Essential hypertension BP Readings from Last 3 Encounters:  09/16/22 120/64  03/17/22 130/78  11/06/21 132/72   Stable, pt to continue medical treatment losartan 25 mg qd, hct 12.5 gn qd  Hyperlipidemia Lab Results  Component Value Date   LDLCALC 64 09/16/2022   Stable, pt to continue current statin pravachol 40 mg qd   Hypothyroidism Lab Results  Component Value Date   TSH 5.25 09/16/2022   Stable, pt to continue levothyroxine 25 mcg qd  Followup: Return in about 6 months (around 03/17/2023).  Cathlean Cower, MD 09/20/2022 9:15 PM [facility]

## 2022-09-16 NOTE — Patient Instructions (Signed)
Please have your Shingrix (shingles) shots done at your local pharmacy.  Please continue all other medications as before, and refills have been done if requested.  Please have the pharmacy call with any other refills you may need.  Please continue your efforts at being more active, low cholesterol diet, and weight control.  You are otherwise up to date with prevention measures today.  Please keep your appointments with your specialists as you may have planned  Please go to the LAB at the blood drawing area for the tests to be done  You will be contacted by phone if any changes need to be made immediately.  Otherwise, you will receive a letter about your results with an explanation, but please check with MyChart first.  Please remember to sign up for MyChart if you have not done so, as this will be important to you in the future with finding out test results, communicating by private email, and scheduling acute appointments online when needed.  Please make an Appointment to return in 6 months, or sooner if needed

## 2022-09-19 ENCOUNTER — Other Ambulatory Visit: Payer: Self-pay | Admitting: Internal Medicine

## 2022-09-19 NOTE — Telephone Encounter (Signed)
Please refill as per office routine med refill policy (all routine meds to be refilled for 3 mo or monthly (per pt preference) up to one year from last visit, then month to month grace period for 3 mo, then further med refills will have to be denied) ? ?

## 2022-09-20 ENCOUNTER — Encounter: Payer: Self-pay | Admitting: Internal Medicine

## 2022-09-20 NOTE — Assessment & Plan Note (Signed)
Lab Results  Component Value Date   HGBA1C 7.6 (H) 09/16/2022   Uncontrolled, goal A1c < 7.5 for age, pt to continue current medical treatment metfomrin ER 500 g - 3 qd declines change for now except to work on DM diet, wt control

## 2022-09-20 NOTE — Assessment & Plan Note (Signed)
Lab Results  Component Value Date   TSH 5.25 09/16/2022   Stable, pt to continue levothyroxine 25 mcg qd

## 2022-09-20 NOTE — Assessment & Plan Note (Signed)
Age and sex appropriate education and counseling updated with regular exercise and diet Referrals for preventative services - pt to call for eye exam soon Immunizations addressed - declines flu shot, for shingrx at pharmacy Smoking counseling  - none needed Evidence for depression or other mood disorder - none significant Most recent labs reviewed. I have personally reviewed and have noted: 1) the patient's medical and social history 2) The patient's current medications and supplements 3) The patient's height, weight, and BMI have been recorded in the chart

## 2022-09-20 NOTE — Assessment & Plan Note (Signed)
BP Readings from Last 3 Encounters:  09/16/22 120/64  03/17/22 130/78  11/06/21 132/72   Stable, pt to continue medical treatment losartan 25 mg qd, hct 12.5 gn qd

## 2022-09-20 NOTE — Assessment & Plan Note (Signed)
Lab Results  Component Value Date   CREATININE 1.11 09/16/2022   Stable overall, cont to avoid nephrotoxins

## 2022-09-20 NOTE — Assessment & Plan Note (Signed)
Lab Results  Component Value Date   LDLCALC 64 09/16/2022   Stable, pt to continue current statin pravachol 40 mg qd

## 2022-10-09 ENCOUNTER — Other Ambulatory Visit: Payer: Self-pay | Admitting: Internal Medicine

## 2022-11-14 ENCOUNTER — Telehealth: Payer: Self-pay | Admitting: Internal Medicine

## 2022-11-14 ENCOUNTER — Other Ambulatory Visit: Payer: Self-pay | Admitting: Internal Medicine

## 2022-11-14 ENCOUNTER — Other Ambulatory Visit: Payer: Self-pay

## 2022-11-14 NOTE — Telephone Encounter (Signed)
Pt wife called needing PA for Rx  furosemide (LASIX) 40 MG tablet

## 2022-12-10 ENCOUNTER — Other Ambulatory Visit: Payer: Self-pay | Admitting: Internal Medicine

## 2022-12-10 ENCOUNTER — Telehealth: Payer: Self-pay | Admitting: Internal Medicine

## 2022-12-10 NOTE — Telephone Encounter (Signed)
Prescription Request  12/10/2022  LOV: 09/16/2022  What is the name of the medication or equipment? metFORMIN (GLUCOPHAGE-XR) 500 MG 24 hr tablet   Have you contacted your pharmacy to request a refill? Yes   Which pharmacy would you like this sent to?  [facility] [address], [place] - [address] [place] [address] Phone: [phone] Fax: [phone]    Patient notified that their request is being sent to the clinical staff for review and that they should receive a response within 2 business days.   Please advise at Mobile [phone] (mobile)

## 2022-12-11 MED ORDER — METFORMIN HCL ER 500 MG PO TB24: ORAL_TABLET | ORAL | 1 refills | Status: DC

## 2022-12-11 NOTE — Telephone Encounter (Signed)
Refill sent to [facility].../lmb 

## 2022-12-21 ENCOUNTER — Other Ambulatory Visit: Payer: Self-pay | Admitting: Internal Medicine

## 2022-12-23 ENCOUNTER — Other Ambulatory Visit: Payer: Self-pay

## 2023-01-03 ENCOUNTER — Other Ambulatory Visit: Payer: Self-pay | Admitting: Internal Medicine

## 2023-02-17 ENCOUNTER — Other Ambulatory Visit: Payer: Self-pay | Admitting: *Deleted

## 2023-02-17 DIAGNOSIS — I6523 Occlusion and stenosis of bilateral carotid arteries: Secondary | ICD-10-CM

## 2023-02-20 ENCOUNTER — Telehealth: Payer: Self-pay | Admitting: Internal Medicine

## 2023-02-20 ENCOUNTER — Other Ambulatory Visit: Payer: Self-pay | Admitting: Internal Medicine

## 2023-02-20 MED ORDER — CLOPIDOGREL BISULFATE 75 MG PO TABS: 75.0000 mg | ORAL_TABLET | Freq: Every day | ORAL | 1 refills | Status: DC

## 2023-02-20 MED ORDER — PANTOPRAZOLE SODIUM 40 MG PO TBEC: 40.0000 mg | DELAYED_RELEASE_TABLET | Freq: Every day | ORAL | 1 refills | Status: DC

## 2023-02-20 NOTE — Telephone Encounter (Signed)
Caller & Relationship to patient: Spouse  Call back number: [phone]   Date of last office visit: 2.20.24  Date of next office visit: 8.20.24  Medication(s) to be refilled:  clopidogrel (PLAVIX) 75 MG tablet  & pantoprazole (PROTONIX) 40 MG tablet   Preferred Pharmacy:   [id number]   Phone: [phone]  Fax: [phone]

## 2023-02-20 NOTE — Telephone Encounter (Signed)
Sent refill to [facility]../lmb 

## 2023-02-25 ENCOUNTER — Encounter (INDEPENDENT_AMBULATORY_CARE_PROVIDER_SITE_OTHER): Payer: Self-pay

## 2023-02-27 ENCOUNTER — Ambulatory Visit: Payer: Medicare (Managed Care)

## 2023-02-27 VITALS — Ht 67.5 in | Wt 204.0 lb

## 2023-02-27 DIAGNOSIS — Z0000 Encounter for general adult medical examination without abnormal findings: Secondary | ICD-10-CM | POA: Diagnosis not present

## 2023-02-27 NOTE — Progress Notes (Signed)
Subjective:   Vincent Campbell is a 82 y.o. male who presents for Medicare Annual/Subsequent preventive examination.  Visit Complete: Virtual  I connected with  Ula Lingo on 02/27/23 by a audio enabled telemedicine application and verified that I am speaking with the correct person using two identifiers.  Patient Location: Home  Provider Location: Home Office  I discussed the limitations of evaluation and management by telemedicine. The patient expressed understanding and agreed to proceed.  Vital Signs: Per patient no change in vitals since last visit.   Review of Systems    Cardiac Risk Factors include: advanced age (>71men, >18 women);diabetes mellitus;male gender;hypertension;dyslipidemia;Other (see comment), Risk factor comments: CKD, Hypothyroidism, Tremor     Objective:    Today's Vitals   02/27/23 1108  Weight: 204 lb (92.5 kg)  Height: 5' 7.5" (1.715 m)   Body mass index is 31.48 kg/m.     02/27/2023   11:22 AM 03/13/2022    2:51 PM 09/21/2020   11:00 AM 09/20/2020   11:07 PM  Advanced Directives  Does Patient Have a Medical Advance Directive? Yes No No Unable to assess, patient is non-responsive or altered mental status  Type of Public librarian Power of Leon;Living will     Does patient want to make changes to medical advance directive?  No - Patient declined    Copy of Healthcare Power of Attorney in Chart? No - copy requested     Would patient like information on creating a medical advance directive?  No - Patient declined No - Patient declined     Current Medications (verified) Outpatient Encounter Medications as of 02/27/2023  Medication Sig   clopidogrel (PLAVIX) 75 MG tablet Take 1 tablet (75 mg total) by mouth daily.   EUTHYROX 25 MCG tablet TAKE 1 TABLET BY MOUTH ONCE DAILY BEFORE BREAKFAST   furosemide (LASIX) 40 MG tablet TAKE 1 TABLET BY MOUTH EVERY OTHER DAY   Glucosamine 500 MG CAPS Take 500 mg by mouth daily.    hydrochlorothiazide (MICROZIDE) 12.5 MG capsule Take 1 capsule by mouth once daily   losartan (COZAAR) 25 MG tablet TAKE 4 TABLETS BY MOUTH ONCE DAILY   metFORMIN (GLUCOPHAGE-XR) 500 MG 24 hr tablet TAKE 3 TABLETS BY MOUTH IN THE MORNING WITH BREAKFAST   pantoprazole (PROTONIX) 40 MG tablet Take 1 tablet (40 mg total) by mouth daily.   pravastatin (PRAVACHOL) 40 MG tablet Take 1 tablet by mouth once daily   VITAMIN D PO Take 2 capsules by mouth daily.   ZINC CITRATE PO Take 1 tablet by mouth daily at 6 (six) AM.   [DISCONTINUED] clopidogrel (PLAVIX) 75 MG tablet Take 1 tablet by mouth once daily   [DISCONTINUED] pantoprazole (PROTONIX) 40 MG tablet Take 1 tablet (40 mg total) by mouth daily.   No facility-administered encounter medications on file as of 02/27/2023.    Allergies (verified) Patient has no known allergies.   History: Past Medical History:  Diagnosis Date   BACK PAIN 01/12/2008   COLONIC POLYPS, HX OF 01/12/2008   Cough 05/28/2010   Encounter for well adult exam with abnormal findings 12/29/2010   HYPERLIPIDEMIA 01/12/2008   HYPERTENSION 01/12/2008   Hypothyroidism 09/15/2018   LEUKOCYTOSIS 03/02/2008   PERIPHERAL EDEMA 06/28/2010   SWELLING MASS OR LUMP IN HEAD AND NECK 05/28/2010   Tremor 08/02/2012   Past Surgical History:  Procedure Laterality Date   APPENDECTOMY     CATARACT EXTRACTION     CHOLECYSTECTOMY     TRANSCAROTID  ARTERY REVASCULARIZATION  Right 09/26/2020   Procedure: RIGHT TRANSCAROTID ARTERY REVASCULARIZATION;  Surgeon: Maeola Harman, MD;  Location: [facility] OR;  Service: Vascular;  Laterality: Right;   Family History  Problem Relation Age of Onset   Cancer Other        esophageal cancer   Heart disease Other    Hypertension Other    Social History   Socioeconomic History   Marital status: Married    Spouse name: Jola Babinski    Number of children: Not on file   Years of education: Not on file   Highest education level: Not on file  Occupational  History   Occupation: OWNER    Employer: [employer]   Occupation: former Radio producer drama   Occupation: former  Nurse, 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 [employer]  Tobacco Use   Smoking status: Former    Current packs/day: 0.00    Types: Cigarettes    Quit date: 04/01/2020    Years since quitting: 2.9   Smokeless tobacco: Never  Substance and Sexual Activity   Alcohol use: Yes   Drug use: No   Sexual activity: Not on file  Other Topics Concern   Not on file  Social History Narrative   Lives in own home with wife   Left Handed   Drinks 3-4 cups caffeine daily         Social Determinants of Health   Financial Resource Strain: Low Risk  (02/27/2023)   Overall Financial Resource Strain (CARDIA)    Difficulty of Paying Living Expenses: Not hard at all  Food Insecurity: No Food Insecurity (02/27/2023)   Hunger Vital Sign    Worried About Running Out of Food in the Last Year: Never true    Ran Out of Food in the Last Year: Never true  Transportation Needs: No Transportation Needs (02/27/2023)   PRAPARE - Administrator, Civil Service (Medical): No    Lack of Transportation (Non-Medical): No  Physical Activity: Sufficiently Active (02/27/2023)   Exercise Vital Sign    Days of Exercise per Week: 3 days    Minutes of Exercise per Session: 60 min  Stress: No Stress Concern Present (02/27/2023)   [employer] of Occupational Health - Occupational Stress Questionnaire    Feeling of Stress : Not at all  Social Connections: Socially Isolated (02/27/2023)   Social Connection and Isolation Panel [NHANES]    Frequency of Communication with Friends and Family: Never    Frequency of Social Gatherings with Friends and Family: Once a week    Attends Religious Services: Never    Database administrator or Organizations: No    Attends Engineer, structural: Never    Marital Status: Married    Tobacco Counseling Counseling given: Not Answered   Clinical  Intake:  Pre-visit preparation completed: Yes  Pain : No/denies pain     BMI - recorded: 31.48 Nutritional Risks: None Diabetes: No  How often do you need to have someone help you when you read instructions, pamphlets, or other written materials from your doctor or pharmacy?: 1 - Never  Interpreter Needed?: No  Information entered by ::  , RMA   Activities of Daily Living    02/27/2023   11:20 AM 03/13/2022    3:04 PM  In your present state of health, do you have any difficulty performing the following activities:  Hearing? 1 1  Vision? 1 0  Difficulty concentrating or making decisions? 1 1  Walking  or climbing stairs? 0 0  Dressing or bathing? 0 0  Doing errands, shopping? 1 0  Preparing Food and eating ? N N  Using the Toilet? N N  In the past six months, have you accidently leaked urine? N Y  Comment  wears protection  Do you have problems with loss of bowel control? N N  Managing your Medications? N N  Managing your Finances? N N  Housekeeping or managing your Housekeeping? N N    Patient Care Team: Corwin Levins, MD as PCP - Renie Ora, MD (Inactive) as Consulting Physician (Dermatology)  Indicate any recent Medical Services you may have received from other than [facility] providers in the past year (date may be approximate).     Assessment:   This is a routine wellness examination for [place].  Hearing/Vision screen Hearing Screening - Comments:: Wears hearing aides Vision Screening - Comments:: Wears eyeglasses  Dietary issues and exercise activities discussed:     Goals Addressed               This Visit's Progress     Patient Stated (pt-stated)        Goes to the [facility] everyday to workout.  He would like to get rid of his trimmers in Left hand.      Depression Screen    02/27/2023   11:29 AM 09/16/2022    9:34 AM 03/17/2022    9:35 AM 03/13/2022    2:57 PM 06/24/2021   10:44 AM 11/21/2020   10:03 AM 06/14/2020    4:29 PM   PHQ 2/9 Scores  PHQ - 2 Score 0 0  0 0 0 0  PHQ- 9 Score 0 0       Exception Documentation   Patient refusal        Fall Risk    02/27/2023   11:22 AM 09/16/2022    9:34 AM 03/13/2022    2:53 PM 06/24/2021   10:44 AM 11/21/2020   10:03 AM  Fall Risk   Falls in the past year? 0 0 1 0 1  Number falls in past yr: 0 0 0 0 1  Injury with Fall? 0 0 0 0 1  Risk for fall due to : No Fall Risks No Fall Risks   Impaired balance/gait  Follow up Falls prevention discussed Falls evaluation completed Falls evaluation completed      MEDICARE RISK AT HOME:  Medicare Risk at Home - 02/27/23 1122     Any stairs in or around the home? Yes    If so, are there any without handrails? Yes    Adequate lighting in your home to reduce risk of falls? Yes    Life alert? No    Use of a cane, walker or w/c? Yes    Grab bars in the bathroom? Yes    Shower chair or bench in shower? Yes    Elevated toilet seat or a handicapped toilet? Yes             TIMED UP AND GO:  Was the test performed?  No    Cognitive Function:        02/27/2023   11:24 AM 03/13/2022    3:06 PM  6CIT Screen  What Year? 0 points 0 points  What month? 0 points 0 points  What time? 0 points 0 points  Count back from 20 0 points 0 points  Months in reverse 0 points 2 points  Repeat phrase 0  points 0 points  Total Score 0 points 2 points    Immunizations Immunization History  Administered Date(s) Administered   Fluad Quad(high Dose 65+) 03/16/2019, 06/14/2020   Influenza, High Dose Seasonal PF 06/26/2016, 07/13/2017, 09/15/2018   Influenza,inj,Quad PF,6+ Mos 05/30/2015   PFIZER(Purple Top)SARS-COV-2 Vaccination 10/27/2019, 11/28/2019   Pneumococcal Conjugate-13 02/03/2013, 08/11/2013   Pneumococcal Polysaccharide-23 07/28/2012   Td 07/28/2001   Tetanus 02/03/2013    TDAP status: Due, Education has been provided regarding the importance of this vaccine. Advised may receive this vaccine at local pharmacy or Health  Dept. Aware to provide a copy of the vaccination record if obtained from local pharmacy or Health Dept. Verbalized acceptance and understanding.  Flu Vaccine status: Due, Education has been provided regarding the importance of this vaccine. Advised may receive this vaccine at local pharmacy or Health Dept. Aware to provide a copy of the vaccination record if obtained from local pharmacy or Health Dept. Verbalized acceptance and understanding.  Pneumococcal vaccine status: Up to date  Covid-19 vaccine status: Completed vaccines  Qualifies for Shingles Vaccine? Yes   Zostavax completed No   Shingrix Completed?: No.    Education has been provided regarding the importance of this vaccine. Patient has been advised to call insurance company to determine out of pocket expense if they have not yet received this vaccine. Advised may also receive vaccine at local pharmacy or Health Dept. Verbalized acceptance and understanding.  Screening Tests Health Maintenance  Topic Date Due   Zoster Vaccines- Shingrix (1 of 2) Never done   OPHTHALMOLOGY EXAM  07/15/2022   DTaP/Tdap/Td (3 - Tdap) 02/04/2023   INFLUENZA VACCINE  02/26/2023   HEMOGLOBIN A1C  03/17/2023   Diabetic kidney evaluation - eGFR measurement  09/17/2023   Diabetic kidney evaluation - Urine ACR  09/17/2023   FOOT EXAM  09/17/2023   Medicare Annual Wellness (AWV)  02/27/2024   Pneumonia Vaccine 25+ Years old  Completed   HPV VACCINES  Aged Out   COVID-19 Vaccine  Discontinued   Hepatitis C Screening  Discontinued    Health Maintenance  Health Maintenance Due  Topic Date Due   Zoster Vaccines- Shingrix (1 of 2) Never done   OPHTHALMOLOGY EXAM  07/15/2022   DTaP/Tdap/Td (3 - Tdap) 02/04/2023   INFLUENZA VACCINE  02/26/2023    Colorectal cancer screening: No longer required.   Lung Cancer Screening: (Low Dose CT Chest recommended if Age 33-80 years, 20 pack-year currently smoking OR have quit w/in 15years.) does not qualify.    Lung Cancer Screening Referral: N/A  Additional Screening:  Hepatitis C Screening: does not qualify;  Vision Screening: Recommended annual ophthalmology exams for early detection of glaucoma and other disorders of the eye. Is the patient up to date with their annual eye exam?  Yes  Who is the provider or what is the name of the office in which the patient attends annual eye exams? [facility] If pt is not established with a provider, would they like to be referred to a provider to establish care? No .   Dental Screening: Recommended annual dental exams for proper oral hygiene  Diabetic Foot Exam: Diabetic Foot Exam: Completed 09/16/2022  Community Resource Referral / Chronic Care Management: CRR required this visit?  No   CCM required this visit?  No     Plan:     I have personally reviewed and noted the following in the patient's chart:   Medical and social history Use of alcohol, tobacco or illicit  drugs  Current medications and supplements including opioid prescriptions. Patient is not currently taking opioid prescriptions. Functional ability and status Nutritional status Physical activity Advanced directives List of other physicians Hospitalizations, surgeries, and ER visits in previous 12 months Vitals Screenings to include cognitive, depression, and falls Referrals and appointments  In addition, I have reviewed and discussed with patient certain preventive protocols, quality metrics, and best practice recommendations. A written personalized care plan for preventive services as well as general preventive health recommendations were provided to patient.      L , CMA   02/27/2023   After Visit Summary: (Mail) Due to this being a telephonic visit, the after visit summary with patients personalized plan was offered to patient via mail   Nurse Notes: Patient stated that he had an annual eye exam in April of this year.  Records have been  requested today.  He is due for Tdap, Flu and Shingrix vaccine.  I informed patient that he can get these at his local pharmacy.  Patient has an up coming office visit with Dr. Jonny Ruiz.  Patient will schedule a AWVs for next year at his in office appointment.

## 2023-02-27 NOTE — Patient Instructions (Signed)
[name] , Thank you for taking time to come for your Medicare Wellness Visit. I appreciate your ongoing commitment to your health goals. Please review the following plan we discussed and let me know if I can assist you in the future.   Referrals/Orders/Follow-Ups/Clinician Recommendations: Remember to get your Tetanus vaccine soon, you are also due for Flu and Shingrix vaccines.  Keep up the good work.  This is a list of the screening recommended for you and due dates:  Health Maintenance  Topic Date Due   Zoster (Shingles) Vaccine (1 of 2) Never done   Eye exam for diabetics  07/15/2022   DTaP/Tdap/Td vaccine (3 - Tdap) 02/04/2023   Flu Shot  02/26/2023   Hemoglobin A1C  03/17/2023   Yearly kidney function blood test for diabetes  09/17/2023   Yearly kidney health urinalysis for diabetes  09/17/2023   Complete foot exam   09/17/2023   Medicare Annual Wellness Visit  02/27/2024   Pneumonia Vaccine  Completed   HPV Vaccine  Aged Out   COVID-19 Vaccine  Discontinued   Hepatitis C Screening  Discontinued    Advanced directives: (Copy Requested) Please bring a copy of your health care power of attorney and living will to the office to be added to your chart at your convenience.  Next Medicare Annual Wellness Visit scheduled for next year: No  Preventive Care 82 Years and Older, Male  Preventive care refers to lifestyle choices and visits with your health care provider that can promote health and wellness. What does preventive care include? A yearly physical exam. This is also called an annual well check. Dental exams once or twice a year. Routine eye exams. Ask your health care provider how often you should have your eyes checked. Personal lifestyle choices, including: Daily care of your teeth and gums. Regular physical activity. Eating a healthy diet. Avoiding tobacco and drug use. Limiting alcohol use. Practicing safe sex. Taking low doses of aspirin every day. Taking  vitamin and mineral supplements as recommended by your health care provider. What happens during an annual well check? The services and screenings done by your health care provider during your annual well check will depend on your age, overall health, lifestyle risk factors, and family history of disease. Counseling  Your health care provider may ask you questions about your: Alcohol use. Tobacco use. Drug use. Emotional well-being. Home and relationship well-being. Sexual activity. Eating habits. History of falls. Memory and ability to understand (cognition). Work and work Astronomer. Screening  You may have the following tests or measurements: Height, weight, and BMI. Blood pressure. Lipid and cholesterol levels. These may be checked every 5 years, or more frequently if you are over 38 years old. Skin check. Lung cancer screening. You may have this screening every year starting at age 6 if you have a 30-pack-year history of smoking and currently smoke or have quit within the past 15 years. Fecal occult blood test (FOBT) of the stool. You may have this test every year starting at age 31. Flexible sigmoidoscopy or colonoscopy. You may have a sigmoidoscopy every 5 years or a colonoscopy every 10 years starting at age 8. Prostate cancer screening. Recommendations will vary depending on your family history and other risks. Hepatitis C blood test. Hepatitis B blood test. Sexually transmitted disease (STD) testing. Diabetes screening. This is done by checking your blood sugar (glucose) after you have not eaten for a while (fasting). You may have this done every 1-3 years. Abdominal aortic  aneurysm (AAA) screening. You may need this if you are a current or former smoker. Osteoporosis. You may be screened starting at age 65 if you are at high risk. Talk with your health care provider about your test results, treatment options, and if necessary, the need for more tests. Vaccines  Your  health care provider may recommend certain vaccines, such as: Influenza vaccine. This is recommended every year. Tetanus, diphtheria, and acellular pertussis (Tdap, Td) vaccine. You may need a Td booster every 10 years. Zoster vaccine. You may need this after age 60. Pneumococcal 13-valent conjugate (PCV13) vaccine. One dose is recommended after age 86. Pneumococcal polysaccharide (PPSV23) vaccine. One dose is recommended after age 82. Talk to your health care provider about which screenings and vaccines you need and how often you need them. This information is not intended to replace advice given to you by your health care provider. Make sure you discuss any questions you have with your health care provider. Document Released: 08/10/2015 Document Revised: 04/02/2016 Document Reviewed: 05/15/2015 Elsevier Interactive Patient Education  2017 [employer].  Fall Prevention in the Home Falls can cause injuries. They can happen to people of all ages. There are many things you can do to make your home safe and to help prevent falls. What can I do on the outside of my home? Regularly fix the edges of walkways and driveways and fix any cracks. Remove anything that might make you trip as you walk through a door, such as a raised step or threshold. Trim any bushes or trees on the path to your home. Use bright outdoor lighting. Clear any walking paths of anything that might make someone trip, such as rocks or tools. Regularly check to see if handrails are loose or broken. Make sure that both sides of any steps have handrails. Any raised decks and porches should have guardrails on the edges. Have any leaves, snow, or ice cleared regularly. Use sand or salt on walking paths during winter. Clean up any spills in your garage right away. This includes oil or grease spills. What can I do in the bathroom? Use night lights. Install grab bars by the toilet and in the tub and shower. Do not use towel bars as  grab bars. Use non-skid mats or decals in the tub or shower. If you need to sit down in the shower, use a plastic, non-slip stool. Keep the floor dry. Clean up any water that spills on the floor as soon as it happens. Remove soap buildup in the tub or shower regularly. Attach bath mats securely with double-sided non-slip rug tape. Do not have throw rugs and other things on the floor that can make you trip. What can I do in the bedroom? Use night lights. Make sure that you have a light by your bed that is easy to reach. Do not use any sheets or blankets that are too big for your bed. They should not hang down onto the floor. Have a firm chair that has side arms. You can use this for support while you get dressed. Do not have throw rugs and other things on the floor that can make you trip. What can I do in the kitchen? Clean up any spills right away. Avoid walking on wet floors. Keep items that you use a lot in easy-to-reach places. If you need to reach something above you, use a strong step stool that has a grab bar. Keep electrical cords out of the way. Do not use floor  polish or wax that makes floors slippery. If you must use wax, use non-skid floor wax. Do not have throw rugs and other things on the floor that can make you trip. What can I do with my stairs? Do not leave any items on the stairs. Make sure that there are handrails on both sides of the stairs and use them. Fix handrails that are broken or loose. Make sure that handrails are as long as the stairways. Check any carpeting to make sure that it is firmly attached to the stairs. Fix any carpet that is loose or worn. Avoid having throw rugs at the top or bottom of the stairs. If you do have throw rugs, attach them to the floor with carpet tape. Make sure that you have a light switch at the top of the stairs and the bottom of the stairs. If you do not have them, ask someone to add them for you. What else can I do to help prevent  falls? Wear shoes that: Do not have high heels. Have rubber bottoms. Are comfortable and fit you well. Are closed at the toe. Do not wear sandals. If you use a stepladder: Make sure that it is fully opened. Do not climb a closed stepladder. Make sure that both sides of the stepladder are locked into place. Ask someone to hold it for you, if possible. Clearly mark and make sure that you can see: Any grab bars or handrails. First and last steps. Where the edge of each step is. Use tools that help you move around (mobility aids) if they are needed. These include: Canes. Walkers. Scooters. Crutches. Turn on the lights when you go into a dark area. Replace any light bulbs as soon as they burn out. Set up your furniture so you have a clear path. Avoid moving your furniture around. If any of your floors are uneven, fix them. If there are any pets around you, be aware of where they are. Review your medicines with your doctor. Some medicines can make you feel dizzy. This can increase your chance of falling. Ask your doctor what other things that you can do to help prevent falls. This information is not intended to replace advice given to you by your health care provider. Make sure you discuss any questions you have with your health care provider. Document Released: 05/10/2009 Document Revised: 12/20/2015 Document Reviewed: 08/18/2014 Elsevier Interactive Patient Education  2017 [employer].

## 2023-03-11 ENCOUNTER — Ambulatory Visit (HOSPITAL_COMMUNITY)
Admission: RE | Admit: 2023-03-11 | Discharge: 2023-03-11 | Disposition: A | Discharge status: Home / Self Care | Payer: Medicare (Managed Care) | Source: Ambulatory Visit | Attending: Vascular Surgery | Admitting: Vascular Surgery

## 2023-03-11 ENCOUNTER — Ambulatory Visit: Payer: Medicare (Managed Care) | Admitting: Vascular Surgery

## 2023-03-11 ENCOUNTER — Encounter: Payer: Self-pay | Admitting: Vascular Surgery

## 2023-03-11 VITALS — BP 155/90 | HR 70 | Temp 97.9°F | Resp 18 | Ht 67.5 in | Wt 203.8 lb

## 2023-03-11 DIAGNOSIS — I6523 Occlusion and stenosis of bilateral carotid arteries: Secondary | ICD-10-CM | POA: Insufficient documentation

## 2023-03-11 NOTE — Progress Notes (Signed)
Patient ID: Vincent Campbell, male   DOB: 1941/03/16, 82 y.o.   MRN: [id number]  Reason for Consult: No chief complaint on file.   Referred by Corwin Levins, MD  Subjective:     HPI:  Vincent Campbell is a 82 y.o. male underwent right transcarotid artery stent over 2 years ago for symptomatic carotid stenosis while inpatient.  He remains on Plavix and Pravachol denies any complaints today.  Chief complaint is tremor on the left side but otherwise he has fully recovered from the stroke.  Past Medical History:  Diagnosis Date   BACK PAIN 01/12/2008   COLONIC POLYPS, HX OF 01/12/2008   Cough 05/28/2010   Encounter for well adult exam with abnormal findings 12/29/2010   HYPERLIPIDEMIA 01/12/2008   HYPERTENSION 01/12/2008   Hypothyroidism 09/15/2018   LEUKOCYTOSIS 03/02/2008   PERIPHERAL EDEMA 06/28/2010   SWELLING MASS OR LUMP IN HEAD AND NECK 05/28/2010   Tremor 08/02/2012   Family History  Problem Relation Age of Onset   Cancer Other        esophageal cancer   Heart disease Other    Hypertension Other    Past Surgical History:  Procedure Laterality Date   APPENDECTOMY     CATARACT EXTRACTION     CHOLECYSTECTOMY     TRANSCAROTID ARTERY REVASCULARIZATION  Right 09/26/2020   Procedure: RIGHT TRANSCAROTID ARTERY REVASCULARIZATION;  Surgeon: Maeola Harman, MD;  Location: [facility] OR;  Service: Vascular;  Laterality: Right;    Short Social History:  Social History   Tobacco Use   Smoking status: Former    Current packs/day: 0.00    Types: Cigarettes    Quit date: 04/01/2020    Years since quitting: 2.9   Smokeless tobacco: Never  Substance Use Topics   Alcohol use: Yes    No Known Allergies  Current Outpatient Medications  Medication Sig Dispense Refill   clopidogrel (PLAVIX) 75 MG tablet Take 1 tablet (75 mg total) by mouth daily. 90 tablet 1   EUTHYROX 25 MCG tablet TAKE 1 TABLET BY MOUTH ONCE DAILY BEFORE BREAKFAST 90 tablet 3   furosemide (LASIX) 40 MG tablet TAKE 1  TABLET BY MOUTH EVERY OTHER DAY 45 tablet 0   Glucosamine 500 MG CAPS Take 500 mg by mouth daily.     hydrochlorothiazide (MICROZIDE) 12.5 MG capsule Take 1 capsule by mouth once daily 90 capsule 0   losartan (COZAAR) 25 MG tablet TAKE 4 TABLETS BY MOUTH ONCE DAILY 360 tablet 2   metFORMIN (GLUCOPHAGE-XR) 500 MG 24 hr tablet TAKE 3 TABLETS BY MOUTH IN THE MORNING WITH BREAKFAST 270 tablet 1   pantoprazole (PROTONIX) 40 MG tablet Take 1 tablet (40 mg total) by mouth daily. 90 tablet 1   pravastatin (PRAVACHOL) 40 MG tablet Take 1 tablet by mouth once daily 90 tablet 0   VITAMIN D PO Take 2 capsules by mouth daily.     ZINC CITRATE PO Take 1 tablet by mouth daily at 6 (six) AM.     No current facility-administered medications for this visit.    Review of Systems  Constitutional:  Constitutional negative. HENT:       Difficulty hearing Eyes: Eyes negative.  Respiratory: Respiratory negative.  Cardiovascular: Cardiovascular negative.  GI: Gastrointestinal negative.  Musculoskeletal: Musculoskeletal negative.  Skin: Skin negative.  Neurological: Neurological negative. Hematologic: Hematologic/lymphatic negative.  Psychiatric: Psychiatric negative.        Objective:  Objective  Vitals:   03/11/23 0820 03/11/23 0824  BP: Marland Kitchen)  144/83 (!) 155/90  Pulse: 70   Resp: 18   Temp: 97.9 F (36.6 C)   SpO2: 94%     Physical Exam HENT:     Head: Normocephalic.     Nose: Nose normal.  Eyes:     Pupils: Pupils are equal, round, and reactive to light.  Neck:     Vascular: No carotid bruit.  Cardiovascular:     Rate and Rhythm: Normal rate.  Pulmonary:     Effort: Pulmonary effort is normal.  Abdominal:     General: Abdomen is flat.  Musculoskeletal:        General: Normal range of motion.     Cervical back: Normal range of motion and neck supple.     Right lower leg: No edema.     Left lower leg: No edema.  Skin:    General: Skin is warm.     Capillary Refill: Capillary refill  takes less than 2 seconds.  Neurological:     General: No focal deficit present.     Mental Status: He is alert.  Psychiatric:        Mood and Affect: Mood normal.        Thought Content: Thought content normal.        Judgment: Judgment normal.     Data: Right Carotid Findings:  +----------+--------+--------+--------+------------------+--------+           PSV cm/sEDV cm/sStenosisPlaque DescriptionComments  +----------+--------+--------+--------+------------------+--------+  CCA Prox  93      19                                          +----------+--------+--------+--------+------------------+--------+  CCA Mid   78      17              homogeneous                 +----------+--------+--------+--------+------------------+--------+  CCA Distal64      16              heterogenous      stent     +----------+--------+--------+--------+------------------+--------+  ICA Prox                                            stent     +----------+--------+--------+--------+------------------+--------+  ICA Mid                                             stent     +----------+--------+--------+--------+------------------+--------+  ICA Distal51      15                                          +----------+--------+--------+--------+------------------+--------+  ECA      347     63      >50%                                +----------+--------+--------+--------+------------------+--------+   +----------+--------+-------+----------------+-------------------+           PSV cm/sEDV  cmsDescribe        Arm Pressure (mmHG)  +----------+--------+-------+----------------+-------------------+  [id number]    5      Multiphasic, [id number]                  +----------+--------+-------+----------------+-------------------+   +---------+--------+--+--------+-+---------+  VertebralPSV cm/s46EDV cm/s8Antegrade   +---------+--------+--+--------+-+---------+      Right Stent(s):  +---------------+--+--++++  Prox to Stent  6416  +---------------+--+--++++  Proximal Stent 5814  +---------------+--+--++++  Mid Stent      338   +---------------+--+--++++  Distal Stent   5112  +---------------+--+--++++  Distal to [id number]  +---------------+--+--++++          Left Carotid Findings:  +----------+--------+--------+--------+------------------+--------+           PSV cm/sEDV cm/sStenosisPlaque DescriptionComments  +----------+--------+--------+--------+------------------+--------+  CCA Prox  114     21                                          +----------+--------+--------+--------+------------------+--------+  CCA Mid   76      15                                          +----------+--------+--------+--------+------------------+--------+  CCA Distal68      17              heterogenous                +----------+--------+--------+--------+------------------+--------+  ICA Prox  59      17      1-39%   heterogenous                +----------+--------+--------+--------+------------------+--------+  ICA Mid   75      22                                          +----------+--------+--------+--------+------------------+--------+  ICA Distal49      13                                          +----------+--------+--------+--------+------------------+--------+  ECA      116     14              heterogenous                +----------+--------+--------+--------+------------------+--------+   +----------+--------+--------+----------------+-------------------+           PSV cm/sEDV cm/sDescribe        Arm Pressure (mmHG)  +----------+--------+--------+----------------+-------------------+  [id number]     3       Multiphasic, [id number]                   +----------+--------+--------+----------------+-------------------+   +---------+--------+--+--------+--+---------+  VertebralPSV cm/s47EDV cm/s13Antegrade  +---------+--------+--+--------+--+---------+         Summary:  Right Carotid: Patent stent with no evidence for restenosis. The ECA  appears                >50% stenosed.   Left Carotid: Velocities in the left ICA are consistent with a 1-39%  stenosis.   Vertebrals: Bilateral vertebral arteries demonstrate antegrade flow.  Subclavians:  Normal flow hemodynamics were seen in bilateral subclavian               arteries.   *See table(s) above for measuremen      Assessment/Plan:    82 year old male over 2 years out from right transcarotid artery stenting without any recurrent stenosis.  Plan will be for follow-up in 1 year and continue Plavix and Pravachol.     Maeola Harman MD [facility]

## 2023-03-13 ENCOUNTER — Other Ambulatory Visit: Payer: Self-pay

## 2023-03-13 DIAGNOSIS — I739 Peripheral vascular disease, unspecified: Secondary | ICD-10-CM

## 2023-03-17 ENCOUNTER — Encounter: Payer: Self-pay | Admitting: Internal Medicine

## 2023-03-17 ENCOUNTER — Telehealth: Payer: Self-pay

## 2023-03-17 ENCOUNTER — Ambulatory Visit: Payer: Medicare (Managed Care) | Admitting: Internal Medicine

## 2023-03-17 VITALS — BP 120/76 | HR 73 | Temp 97.9°F | Ht 67.5 in | Wt 203.0 lb

## 2023-03-17 DIAGNOSIS — E1122 Type 2 diabetes mellitus with diabetic chronic kidney disease: Secondary | ICD-10-CM | POA: Diagnosis not present

## 2023-03-17 DIAGNOSIS — F32A Depression, unspecified: Secondary | ICD-10-CM

## 2023-03-17 DIAGNOSIS — I10 Essential (primary) hypertension: Secondary | ICD-10-CM | POA: Diagnosis not present

## 2023-03-17 DIAGNOSIS — R251 Tremor, unspecified: Secondary | ICD-10-CM

## 2023-03-17 DIAGNOSIS — Z7984 Long term (current) use of oral hypoglycemic drugs: Secondary | ICD-10-CM

## 2023-03-17 DIAGNOSIS — E559 Vitamin D deficiency, unspecified: Secondary | ICD-10-CM

## 2023-03-17 DIAGNOSIS — R609 Edema, unspecified: Secondary | ICD-10-CM | POA: Diagnosis not present

## 2023-03-17 DIAGNOSIS — N182 Chronic kidney disease, stage 2 (mild): Secondary | ICD-10-CM

## 2023-03-17 LAB — LIPID PANEL
Cholesterol: 157 mg/dL (ref 0–200)
HDL: 52 mg/dL (ref >39.00)
LDL Cholesterol: 70 mg/dL (ref 0–99)
NonHDL: 104.81
Total CHOL/HDL Ratio: 3
Triglycerides: 174 mg/dL — ABNORMAL HIGH (ref 0.0–149.0)
VLDL: 34.8 mg/dL (ref 0.0–40.0)

## 2023-03-17 LAB — HEPATIC FUNCTION PANEL
ALT: 16 U/L (ref 0–53)
AST: 18 U/L (ref 0–37)
Albumin: 4.2 g/dL (ref 3.5–5.2)
Alkaline Phosphatase: 57 U/L (ref 39–117)
Bilirubin, Direct: 0.1 mg/dL (ref 0.0–0.3)
Total Bilirubin: 0.9 mg/dL (ref 0.2–1.2)
Total Protein: 7.2 g/dL (ref 6.0–8.3)

## 2023-03-17 LAB — BASIC METABOLIC PANEL
BUN: 19 mg/dL (ref 6–23)
CO2: 29 mEq/L (ref 19–32)
Calcium: 9.8 mg/dL (ref 8.4–10.5)
Chloride: 102 mEq/L (ref 96–112)
Creatinine, Ser: 1.17 mg/dL (ref 0.40–1.50)
GFR: 58.23 mL/min — ABNORMAL LOW (ref >60.00)
Glucose, Bld: 159 mg/dL — ABNORMAL HIGH (ref 70–99)
Potassium: 4.2 mEq/L (ref 3.5–5.1)
Sodium: 140 mEq/L (ref 135–145)

## 2023-03-17 LAB — HEMOGLOBIN A1C: Hgb A1c MFr Bld: 7.7 % — ABNORMAL HIGH (ref 4.6–6.5)

## 2023-03-17 LAB — VITAMIN D 25 HYDROXY (VIT D DEFICIENCY, FRACTURES): VITD: >120 ng/mL

## 2023-03-17 MED ORDER — CITALOPRAM HYDROBROMIDE 10 MG PO TABS: 10.0000 mg | ORAL_TABLET | Freq: Every day | ORAL | 3 refills | Status: DC

## 2023-03-17 MED ORDER — PROPRANOLOL HCL 10 MG PO TABS: 10.0000 mg | ORAL_TABLET | Freq: Every day | ORAL | 3 refills | Status: DC

## 2023-03-17 NOTE — Patient Instructions (Signed)
Ok to change the lasix to where you take this 4 times per wk instead of 3  Please take all new medication as prescribed  - the inderal 10 mg per day for tremor, and the celexa 10 mg per day for irritability depression  Please continue all other medications as before, and refills have been done if requested.  Please have the pharmacy call with any other refills you may need.  Please continue your efforts at being more active, low cholesterol diet, and weight control.  Please keep your appointments with your specialists as you may have planned  Please go to the LAB at the blood drawing area for the tests to be done  .You will be contacted by phone if any changes need to be made immediately.  Otherwise, you will receive a letter about your results with an explanation, but please check with MyChart first.  Please make an Appointment to return in 6 months, or sooner if needed

## 2023-03-17 NOTE — Progress Notes (Signed)
Patient ID: Vincent Campbell, male   DOB: June 19, 1941, 82 y.o.   MRN: [id number]        Chief Complaint: follow up left hand tremor, anxiety irritability depression, dm, hld, leg swelling       HPI:  Vincent Campbell is a 82 y.o. male here with wife, has worsening left hand tremor, has been out of inderal .  Pt denies chest pain, increased sob or doe, wheezing, orthopnea, PND, palpitations, dizziness or syncope but has mild persistent leg swelling. .   Pt denies polydipsia, polyuria, or new focal neuro s/s.    Pt denies fever, wt loss, night sweats, loss of appetite, or other constitutional symptoms  has had mild worsening depressive symptoms with anxiety and irritability but no suicidal ideation, or panic.   Wt Readings from Last 3 Encounters:  03/17/23 203 lb (92.1 kg)  03/11/23 203 lb 12.8 oz (92.4 kg)  02/27/23 204 lb (92.5 kg)   BP Readings from Last 3 Encounters:  03/17/23 120/76  03/11/23 (!) 155/90  09/16/22 120/64         Past Medical History:  Diagnosis Date   BACK PAIN 01/12/2008   COLONIC POLYPS, HX OF 01/12/2008   Cough 05/28/2010   Encounter for well adult exam with abnormal findings 12/29/2010   HYPERLIPIDEMIA 01/12/2008   HYPERTENSION 01/12/2008   Hypothyroidism 09/15/2018   LEUKOCYTOSIS 03/02/2008   PERIPHERAL EDEMA 06/28/2010   SWELLING MASS OR LUMP IN HEAD AND NECK 05/28/2010   Tremor 08/02/2012   Past Surgical History:  Procedure Laterality Date   APPENDECTOMY     CATARACT EXTRACTION     CHOLECYSTECTOMY     TRANSCAROTID ARTERY REVASCULARIZATION  Right 09/26/2020   Procedure: RIGHT TRANSCAROTID ARTERY REVASCULARIZATION;  Surgeon: Maeola Harman, MD;  Location: [facility] OR;  Service: Vascular;  Laterality: Right;    reports that he quit smoking about 2 years ago. His smoking use included cigarettes. He has never used smokeless tobacco. He reports current alcohol use. He reports that he does not use drugs. family history includes Cancer in an other family member; Heart  disease in an other family member; Hypertension in an other family member. No Known Allergies Current Outpatient Medications on File Prior to Visit  Medication Sig Dispense Refill   clopidogrel (PLAVIX) 75 MG tablet Take 1 tablet (75 mg total) by mouth daily. 90 tablet 1   EUTHYROX 25 MCG tablet TAKE 1 TABLET BY MOUTH ONCE DAILY BEFORE BREAKFAST 90 tablet 3   furosemide (LASIX) 40 MG tablet TAKE 1 TABLET BY MOUTH EVERY OTHER DAY 45 tablet 0   Glucosamine 500 MG CAPS Take 500 mg by mouth daily.     hydrochlorothiazide (MICROZIDE) 12.5 MG capsule Take 1 capsule by mouth once daily 90 capsule 0   losartan (COZAAR) 25 MG tablet TAKE 4 TABLETS BY MOUTH ONCE DAILY 360 tablet 2   metFORMIN (GLUCOPHAGE-XR) 500 MG 24 hr tablet TAKE 3 TABLETS BY MOUTH IN THE MORNING WITH BREAKFAST 270 tablet 1   pantoprazole (PROTONIX) 40 MG tablet Take 1 tablet (40 mg total) by mouth daily. 90 tablet 1   pravastatin (PRAVACHOL) 40 MG tablet Take 1 tablet by mouth once daily 90 tablet 0   VITAMIN D PO Take 2 capsules by mouth daily.     ZINC CITRATE PO Take 1 tablet by mouth daily at 6 (six) AM.     No current facility-administered medications on file prior to visit.        ROS:  All others reviewed and negative.  Objective        PE:  BP 120/76 (BP Location: Right Arm, Patient Position: Sitting, Cuff Size: Normal)   Pulse 73   Temp 97.9 F (36.6 C) (Oral)   Ht 5' 7.5" (1.715 m)   Wt 203 lb (92.1 kg)   SpO2 96%   BMI 31.33 kg/m                 Constitutional: Pt appears in NAD               HENT: Head: NCAT.                Right Ear: External ear normal.                 Left Ear: External ear normal.                Eyes: . Pupils are equal, round, and reactive to light. Conjunctivae and EOM are normal               Nose: without d/c or deformity               Neck: Neck supple. Gross normal ROM               Cardiovascular: Normal rate and regular rhythm.                 Pulmonary/Chest: Effort normal  and breath sounds without rales or wheezing.                Abd:  Soft, NT, ND, + BS, no organomegaly               Neurological: Pt is alert. At baseline orientation, motor grossly intact               Skin: Skin is warm. No rashes, no other new lesions, LE edema - 1-2+ bilateral               Psychiatric: Pt behavior is normal without agitation   Micro: none  Cardiac tracings I have personally interpreted today:  none  Pertinent Radiological findings (summarize): none   Lab Results  Component Value Date   WBC 9.2 09/16/2022   HGB 13.8 09/16/2022   HCT 42.1 09/16/2022   PLT 258.0 09/16/2022   GLUCOSE 159 (H) 03/17/2023   CHOL 157 03/17/2023   TRIG 174.0 (H) 03/17/2023   HDL 52.00 03/17/2023   LDLDIRECT 116.7 05/28/2010   LDLCALC 70 03/17/2023   ALT 16 03/17/2023   AST 18 03/17/2023   NA 140 03/17/2023   K 4.2 03/17/2023   CL 102 03/17/2023   CREATININE 1.17 03/17/2023   BUN 19 03/17/2023   CO2 29 03/17/2023   TSH 5.25 09/16/2022   PSA 2.70 09/20/2019   INR 1.3 (H) 09/20/2020   HGBA1C 7.7 (H) 03/17/2023   MICROALBUR 0.8 09/16/2022   Assessment/Plan:  Vincent Campbell is a 82 y.o. White or Caucasian [1] male with  has a past medical history of BACK PAIN (01/12/2008), COLONIC POLYPS, HX OF (01/12/2008), Cough (05/28/2010), Encounter for well adult exam with abnormal findings (12/29/2010), HYPERLIPIDEMIA (01/12/2008), HYPERTENSION (01/12/2008), Hypothyroidism (09/15/2018), LEUKOCYTOSIS (03/02/2008), PERIPHERAL EDEMA (06/28/2010), SWELLING MASS OR LUMP IN HEAD AND NECK (05/28/2010), and Tremor (08/02/2012).  Depression With recent mild worsening, for celexa 10 qd  CKD (chronic kidney disease), stage II Lab Results  Component Value Date   CREATININE 1.17 03/17/2023   Stable  overall, cont to avoid nephrotoxins   Diabetes (HCC) Lab Results  Component Value Date   HGBA1C 7.7 (H) 03/17/2023   Uncontrolled, goal A1c < 7/5, pt to continue current medical treatment metformin ER 500 mg-  3 every day as o/w declines change in tx   Essential hypertension BP Readings from Last 3 Encounters:  03/17/23 120/76  03/11/23 (!) 155/90  09/16/22 120/64   Stable, pt to continue medical treatment hct 12.5, losartan 25 qd   Tremor With recent worsening, for restart inderal 10 qd  PERIPHERAL EDEMA Now taking lasix 3 x weekly, ok for increased lasix to 4 x per wk  Followup: Return in about 6 months (around 09/17/2023).  Oliver Barre, MD 03/20/2023 9:51 PM [facility]

## 2023-03-20 ENCOUNTER — Encounter: Payer: Self-pay | Admitting: Internal Medicine

## 2023-03-20 NOTE — Assessment & Plan Note (Signed)
Now taking lasix 3 x weekly, ok for increased lasix to 4 x per wk

## 2023-03-20 NOTE — Assessment & Plan Note (Signed)
With recent mild worsening, for celexa 10 qd

## 2023-03-20 NOTE — Assessment & Plan Note (Signed)
Lab Results  Component Value Date   HGBA1C 7.7 (H) 03/17/2023   Uncontrolled, goal A1c < 7/5, pt to continue current medical treatment metformin ER 500 mg- 3 every day as o/w declines change in tx

## 2023-03-20 NOTE — Assessment & Plan Note (Signed)
BP Readings from Last 3 Encounters:  03/17/23 120/76  03/11/23 (!) 155/90  09/16/22 120/64   Stable, pt to continue medical treatment hct 12.5, losartan 25 qd

## 2023-03-20 NOTE — Assessment & Plan Note (Signed)
Lab Results  Component Value Date   CREATININE 1.17 03/17/2023   Stable overall, cont to avoid nephrotoxins

## 2023-03-20 NOTE — Assessment & Plan Note (Signed)
With recent worsening, for restart inderal 10 qd

## 2023-03-21 ENCOUNTER — Other Ambulatory Visit: Payer: Self-pay | Admitting: Internal Medicine

## 2023-03-23 ENCOUNTER — Other Ambulatory Visit: Payer: Self-pay

## 2023-04-13 ENCOUNTER — Telehealth: Payer: Self-pay | Admitting: Internal Medicine

## 2023-04-13 MED ORDER — LOSARTAN POTASSIUM 25 MG PO TABS: ORAL_TABLET | ORAL | 2 refills | Status: DC

## 2023-04-13 NOTE — Telephone Encounter (Signed)
Pt is requesting a refill for  losartan (COZAAR) 25 MG tablet TAKE 4 TABLETS BY MOUTH ONCE DAILY Dispense: 360 tablet, Refills: 2 ordered   01/06/2023 -- Corwin Levins, MD          Summary: TAKE 4 TABLETS BY MOUTH ONCE DAILY, Normal Dx Associated: Different Encounter: Start Date & Time: 06/11/2024Ord/Sold: 01/06/2023 (O)Ordered On: 06/11/2024ReportPharmacy: [id number] - [place], [place] - [address]      Ordering Department: [facility] Admin Instructions: TAKE 4 TABLETS BY MOUTH ONCE DAILY    Pt states that the pharmacy only gave them 60 tabs instead of 360 and was wondering was that a mistake on our end or his insurance because he came out of pocket of 15$ for the 60 tabs when he is used to getting his right amount. Pt is scared to run out.

## 2023-04-13 NOTE — Telephone Encounter (Signed)
Refill too soon , Pt needs to contact pharmacy.

## 2023-04-13 NOTE — Telephone Encounter (Signed)
Ok the rx is resent

## 2023-04-24 ENCOUNTER — Other Ambulatory Visit: Payer: Self-pay | Admitting: Internal Medicine

## 2023-04-24 MED ORDER — LOSARTAN POTASSIUM 100 MG PO TABS: 100.0000 mg | ORAL_TABLET | Freq: Every day | ORAL | 3 refills | Status: DC

## 2023-04-24 NOTE — Telephone Encounter (Signed)
Ok to let pt know the insurance will no longer pay for the losartan at 4 pills of the 25 mg  We need to change to 100 mg per day - done erx

## 2023-04-27 NOTE — Telephone Encounter (Signed)
Provider provided plan in result notes to patient

## 2023-06-13 ENCOUNTER — Other Ambulatory Visit: Payer: Self-pay | Admitting: Internal Medicine

## 2023-06-15 ENCOUNTER — Other Ambulatory Visit: Payer: Self-pay

## 2023-08-22 ENCOUNTER — Other Ambulatory Visit: Payer: Self-pay | Admitting: Internal Medicine

## 2023-08-24 ENCOUNTER — Other Ambulatory Visit: Payer: Self-pay

## 2023-08-29 ENCOUNTER — Other Ambulatory Visit: Payer: Self-pay | Admitting: Internal Medicine

## 2023-08-31 ENCOUNTER — Other Ambulatory Visit: Payer: Self-pay

## 2023-09-17 ENCOUNTER — Ambulatory Visit: Payer: Medicare (Managed Care) | Admitting: Internal Medicine

## 2023-11-29 ENCOUNTER — Other Ambulatory Visit: Payer: Self-pay | Admitting: Internal Medicine

## 2023-11-30 ENCOUNTER — Other Ambulatory Visit: Payer: Self-pay

## 2023-12-10 DIAGNOSIS — H52223 Regular astigmatism, bilateral: Secondary | ICD-10-CM | POA: Diagnosis not present

## 2024-01-08 DIAGNOSIS — H353131 Nonexudative age-related macular degeneration, bilateral, early dry stage: Secondary | ICD-10-CM | POA: Diagnosis not present

## 2024-01-09 ENCOUNTER — Other Ambulatory Visit: Payer: Self-pay | Admitting: Internal Medicine

## 2024-01-15 DIAGNOSIS — H6123 Impacted cerumen, bilateral: Secondary | ICD-10-CM | POA: Diagnosis not present

## 2024-01-15 DIAGNOSIS — H903 Sensorineural hearing loss, bilateral: Secondary | ICD-10-CM | POA: Diagnosis not present

## 2024-02-05 DIAGNOSIS — H903 Sensorineural hearing loss, bilateral: Secondary | ICD-10-CM | POA: Diagnosis not present

## 2024-02-05 DIAGNOSIS — H6122 Impacted cerumen, left ear: Secondary | ICD-10-CM | POA: Diagnosis not present

## 2024-02-06 ENCOUNTER — Other Ambulatory Visit: Payer: Self-pay | Admitting: Internal Medicine

## 2024-02-13 ENCOUNTER — Other Ambulatory Visit: Payer: Self-pay | Admitting: Internal Medicine

## 2024-02-18 ENCOUNTER — Other Ambulatory Visit: Payer: Self-pay | Admitting: Vascular Surgery

## 2024-02-18 DIAGNOSIS — I6523 Occlusion and stenosis of bilateral carotid arteries: Secondary | ICD-10-CM

## 2024-03-16 ENCOUNTER — Ambulatory Visit (HOSPITAL_COMMUNITY): Payer: Medicare (Managed Care) | Admitting: Vascular Surgery

## 2024-03-16 ENCOUNTER — Encounter: Payer: Self-pay | Admitting: Vascular Surgery

## 2024-03-16 ENCOUNTER — Ambulatory Visit (HOSPITAL_COMMUNITY)
Admission: RE | Admit: 2024-03-16 | Discharge: 2024-03-16 | Disposition: A | Discharge status: Home / Self Care | Payer: Medicare (Managed Care) | Source: Ambulatory Visit | Attending: Vascular Surgery | Admitting: Vascular Surgery

## 2024-03-16 VITALS — BP 107/63 | HR 60 | Temp 98.0°F | Resp 18 | Ht 67.5 in | Wt 198.4 lb

## 2024-03-16 DIAGNOSIS — I6523 Occlusion and stenosis of bilateral carotid arteries: Secondary | ICD-10-CM | POA: Diagnosis not present

## 2024-03-16 NOTE — Progress Notes (Signed)
 Patient ID: Vincent Campbell, male   DOB: 11/22/1940, 83 y.o.   MRN: [id number]  Reason for Consult: Follow-up   Referred by Norleen Lynwood LELON, MD  Subjective:     HPI:  Vincent Campbell is a 83 y.o. male history of right-sided transcarotid artery stenting for symptomatic disease.  He does not have any complaints today.  He states that since his stroke he has had some imbalance and his tremor has worsened on his left side but overall he remains active at the [facility].  He does have lower extremity edema which has been present for many years.  No other complaints related to today's visit.  He remains on Plavix  and Pravachol .  Past Medical History:  Diagnosis Date   BACK PAIN 01/12/2008   Carotid artery occlusion    COLONIC POLYPS, HX OF 01/12/2008   Cough 05/28/2010   Encounter for well adult exam with abnormal findings 12/29/2010   HYPERLIPIDEMIA 01/12/2008   HYPERTENSION 01/12/2008   Hypothyroidism 09/15/2018   LEUKOCYTOSIS 03/02/2008   PERIPHERAL EDEMA 06/28/2010   SWELLING MASS OR LUMP IN HEAD AND NECK 05/28/2010   Tremor 08/02/2012   Family History  Problem Relation Age of Onset   Cancer Other        esophageal cancer   Heart disease Other    Hypertension Other    Past Surgical History:  Procedure Laterality Date   APPENDECTOMY     CATARACT EXTRACTION     CHOLECYSTECTOMY     TRANSCAROTID ARTERY REVASCULARIZATION  Right 09/26/2020   Procedure: RIGHT TRANSCAROTID ARTERY REVASCULARIZATION;  Surgeon: Sheree Penne Bruckner, MD;  Location: [facility] OR;  Service: Vascular;  Laterality: Right;    Short Social History:  Social History   Tobacco Use   Smoking status: Former    Current packs/day: 0.00    Types: Cigarettes    Quit date: 04/01/2020    Years since quitting: 3.9   Smokeless tobacco: Never  Substance Use Topics   Alcohol use: Yes    No Known Allergies  Current Outpatient Medications  Medication Sig Dispense Refill   citalopram  (CELEXA ) 10 MG tablet Take 1 tablet  (10 mg total) by mouth daily. 90 tablet 3   clopidogrel  (PLAVIX ) 75 MG tablet Take 1 tablet (75 mg total) by mouth daily. 90 tablet 1   EUTHYROX  25 MCG tablet TAKE 1 TABLET BY MOUTH ONCE DAILY BEFORE BREAKFAST 90 tablet 3   furosemide  (LASIX ) 40 MG tablet TAKE 1 TABLET BY MOUTH EVERY OTHER DAY 45 tablet 0   Glucosamine 500 MG CAPS Take 500 mg by mouth daily.     hydrochlorothiazide  (MICROZIDE ) 12.5 MG capsule Take 1 capsule by mouth once daily 90 capsule 0   losartan  (COZAAR ) 100 MG tablet Take 1 tablet (100 mg total) by mouth daily. 90 tablet 3   metFORMIN  (GLUCOPHAGE -XR) 500 MG 24 hr tablet TAKE 3 TABLETS BY MOUTH IN THE MORNING WITH BREAKFAST. Patient to follow up with PCP prior to future refills 270 tablet 0   pantoprazole  (PROTONIX ) 40 MG tablet Take 1 tablet by mouth once daily 90 tablet 0   pravastatin  (PRAVACHOL ) 40 MG tablet Take 1 tablet by mouth once daily 90 tablet 3   propranolol  (INDERAL ) 10 MG tablet Take 1 tablet (10 mg total) by mouth daily. 90 tablet 3   VITAMIN D  PO Take 2 capsules by mouth daily.     ZINC CITRATE PO Take 1 tablet by mouth daily at 6 (six) AM.  No current facility-administered medications for this visit.    Review of Systems  Constitutional:  Constitutional negative. HENT: HENT negative.  Eyes: Eyes negative.  Cardiovascular: Positive for leg swelling.  GI: Gastrointestinal negative.  Skin: Skin negative.  Neurological:       Imbalance, tremor Hematologic: Hematologic/lymphatic negative.  Psychiatric: Psychiatric negative.        Objective:  Objective   Vitals:   03/16/24 1019 03/16/24 1022  BP: 107/63 107/63  Pulse: 60   Resp: 18   Temp: 98 F (36.7 C)   TempSrc: Temporal   SpO2: 94%   Weight: 198 lb 6.4 oz (90 kg)   Height: 5' 7.5 (1.715 m)    Body mass index is 30.62 kg/m.  Physical Exam HENT:     Head: Normocephalic.     Mouth/Throat:     Mouth: Mucous membranes are moist.  Eyes:     Pupils: Pupils are equal, round,  and reactive to light.  Neck:     Vascular: No carotid bruit.  Cardiovascular:     Pulses: Normal pulses.  Pulmonary:     Effort: Pulmonary effort is normal.  Abdominal:     General: Abdomen is flat.  Musculoskeletal:     Right lower leg: Edema present.     Left lower leg: Edema present.  Skin:    Capillary Refill: Capillary refill takes less than 2 seconds.  Neurological:     General: No focal deficit present.     Mental Status: He is alert.     Data: Right Carotid Findings:  +----------+--------+--------+--------+------------------+--------+           PSV cm/sEDV cm/sStenosisPlaque DescriptionComments  +----------+--------+--------+--------+------------------+--------+  CCA Prox  88      14                                          +----------+--------+--------+--------+------------------+--------+  CCA Distal75      16                                          +----------+--------+--------+--------+------------------+--------+  ICA Distal77      17                                          +----------+--------+--------+--------+------------------+--------+  ECA      378     42      >50%                                +----------+--------+--------+--------+------------------+--------+   +----------+--------+-------+----------------+-------------------+           PSV cm/sEDV cmsDescribe        Arm Pressure (mmHG)  +----------+--------+-------+----------------+-------------------+  [id number]           Multiphasic, WNL                     +----------+--------+-------+----------------+-------------------+   +---------+--------+--+--------+-+---------+  VertebralPSV cm/s33EDV cm/s7Antegrade  +---------+--------+--+--------+-+---------+      Right Stent(s):  +------------------+--------+--------+--------+--------+--------+  distal CCA-mid ICAPSV cm/sEDV cm/sStenosisWaveformComments   +------------------+--------+--------+--------+--------+--------+  Prox to Stent     66      17                                +------------------+--------+--------+--------+--------+--------+  Proximal Stent    62      13                                +------------------+--------+--------+--------+--------+--------+  Mid Stent         48      10                                +------------------+--------+--------+--------+--------+--------+  Distal Stent      50      11                                +------------------+--------+--------+--------+--------+--------+  Distal to Stent   82      15                                +------------------+--------+--------+--------+--------+--------+          Left Carotid Findings:  +----------+--------+--------+--------+------------------+--------+           PSV cm/sEDV cm/sStenosisPlaque DescriptionComments  +----------+--------+--------+--------+------------------+--------+  CCA Prox  125     19                                          +----------+--------+--------+--------+------------------+--------+  CCA Distal82      15                                          +----------+--------+--------+--------+------------------+--------+  ICA Prox  94      17      1-39%   heterogenous                +----------+--------+--------+--------+------------------+--------+  ICA Mid   82      20                                          +----------+--------+--------+--------+------------------+--------+  ICA Distal67      18                                          +----------+--------+--------+--------+------------------+--------+  ECA      160     7                                           +----------+--------+--------+--------+------------------+--------+   +----------+--------+--------+----------------+-------------------+           PSV cm/sEDV cm/sDescribe         Arm Pressure (mmHG)  +----------+--------+--------+----------------+-------------------+  Subclavian102            Multiphasic, WNL                     +----------+--------+--------+----------------+-------------------+   +---------+--------+--+--------+--+---------+  VertebralPSV cm/s72EDV cm/s14Antegrade  +---------+--------+--+--------+--+---------+        Summary:  Right  Carotid: Patent stent without evidence of restenosis.                 The ECA appears >50% stenosed.   Left Carotid: Velocities in the left ICA are consistent with a 1-39%  stenosis.   Vertebrals: Bilateral vertebral arteries demonstrate antegrade flow.  Subclavians: Normal flow hemodynamics were seen in bilateral subclavian               arteries.      Assessment/Plan:     83 year old male continues to progress well after right-sided transcarotid artery stenting without evidence of restenosis now 3 years since post procedure.  Plan will be for follow-up in 1 year with repeat carotid duplex.  All questions were answered and he and his wife demonstrate good understanding.     Penne Lonni Colorado MD [facility]

## 2024-03-29 ENCOUNTER — Ambulatory Visit: Payer: Self-pay | Admitting: Internal Medicine

## 2024-03-29 ENCOUNTER — Ambulatory Visit: Payer: Medicare (Managed Care) | Admitting: Internal Medicine

## 2024-03-29 ENCOUNTER — Encounter: Payer: Self-pay | Admitting: Internal Medicine

## 2024-03-29 ENCOUNTER — Telehealth: Payer: Self-pay | Admitting: Radiology

## 2024-03-29 VITALS — BP 110/80 | HR 49 | Temp 97.6°F | Ht 67.5 in | Wt 192.0 lb

## 2024-03-29 DIAGNOSIS — N182 Chronic kidney disease, stage 2 (mild): Secondary | ICD-10-CM | POA: Diagnosis not present

## 2024-03-29 DIAGNOSIS — E538 Deficiency of other specified B group vitamins: Secondary | ICD-10-CM | POA: Diagnosis not present

## 2024-03-29 DIAGNOSIS — R609 Edema, unspecified: Secondary | ICD-10-CM | POA: Diagnosis not present

## 2024-03-29 DIAGNOSIS — I10 Essential (primary) hypertension: Secondary | ICD-10-CM

## 2024-03-29 DIAGNOSIS — E559 Vitamin D deficiency, unspecified: Secondary | ICD-10-CM | POA: Diagnosis not present

## 2024-03-29 DIAGNOSIS — E039 Hypothyroidism, unspecified: Secondary | ICD-10-CM

## 2024-03-29 DIAGNOSIS — Z0001 Encounter for general adult medical examination with abnormal findings: Secondary | ICD-10-CM | POA: Diagnosis not present

## 2024-03-29 DIAGNOSIS — E7800 Pure hypercholesterolemia, unspecified: Secondary | ICD-10-CM | POA: Diagnosis not present

## 2024-03-29 DIAGNOSIS — Z7984 Long term (current) use of oral hypoglycemic drugs: Secondary | ICD-10-CM | POA: Diagnosis not present

## 2024-03-29 DIAGNOSIS — E1122 Type 2 diabetes mellitus with diabetic chronic kidney disease: Secondary | ICD-10-CM | POA: Diagnosis not present

## 2024-03-29 LAB — CBC WITH DIFFERENTIAL/PLATELET
Basophils Absolute: 0.1 K/uL (ref 0.0–0.1)
Basophils Relative: 0.6 % (ref 0.0–3.0)
Eosinophils Absolute: 0.1 K/uL (ref 0.0–0.7)
Eosinophils Relative: 1.3 % (ref 0.0–5.0)
HCT: 40 % (ref 39.0–52.0)
Hemoglobin: 13.3 g/dL (ref 13.0–17.0)
Lymphocytes Relative: 24.1 % (ref 12.0–46.0)
Lymphs Abs: 2.2 K/uL (ref 0.7–4.0)
MCHC: 33.3 g/dL (ref 30.0–36.0)
MCV: 95.5 fl (ref 78.0–100.0)
Monocytes Absolute: 1.6 K/uL — ABNORMAL HIGH (ref 0.1–1.0)
Monocytes Relative: 16.9 % — ABNORMAL HIGH (ref 3.0–12.0)
Neutro Abs: 5.3 K/uL (ref 1.4–7.7)
Neutrophils Relative %: 57.1 % (ref 43.0–77.0)
Platelets: 239 K/uL (ref 150.0–400.0)
RBC: 4.19 Mil/uL — ABNORMAL LOW (ref 4.22–5.81)
RDW: 14.1 % (ref 11.5–15.5)
WBC: 9.2 K/uL (ref 4.0–10.5)

## 2024-03-29 LAB — MICROALBUMIN / CREATININE URINE RATIO
Creatinine,U: 75.8 mg/dL
Microalb Creat Ratio: UNDETERMINED mg/g (ref 0.0–30.0)
Microalb, Ur: <0.7 mg/dL

## 2024-03-29 LAB — BASIC METABOLIC PANEL WITH GFR
BUN: 13 mg/dL (ref 6–23)
CO2: 28 meq/L (ref 19–32)
Calcium: 9 mg/dL (ref 8.4–10.5)
Chloride: 98 meq/L (ref 96–112)
Creatinine, Ser: 0.94 mg/dL (ref 0.40–1.50)
GFR: 75.18 mL/min (ref >60.00)
Glucose, Bld: 116 mg/dL — ABNORMAL HIGH (ref 70–99)
Potassium: 4.4 meq/L (ref 3.5–5.1)
Sodium: 137 meq/L (ref 135–145)

## 2024-03-29 LAB — URINALYSIS, ROUTINE W REFLEX MICROSCOPIC
Bilirubin Urine: NEGATIVE
Hgb urine dipstick: NEGATIVE
Ketones, ur: NEGATIVE
Leukocytes,Ua: NEGATIVE
Nitrite: NEGATIVE
RBC / HPF: NONE SEEN (ref 0–?)
Specific Gravity, Urine: 1.01 (ref 1.000–1.030)
Total Protein, Urine: NEGATIVE
Urine Glucose: NEGATIVE
Urobilinogen, UA: 0.2 (ref 0.0–1.0)
pH: 6 (ref 5.0–8.0)

## 2024-03-29 LAB — HEPATIC FUNCTION PANEL
ALT: 21 U/L (ref 0–53)
AST: 21 U/L (ref 0–37)
Albumin: 4.1 g/dL (ref 3.5–5.2)
Alkaline Phosphatase: 71 U/L (ref 39–117)
Bilirubin, Direct: 0.2 mg/dL (ref 0.0–0.3)
Total Bilirubin: 0.9 mg/dL (ref 0.2–1.2)
Total Protein: 7.3 g/dL (ref 6.0–8.3)

## 2024-03-29 LAB — LIPID PANEL
Cholesterol: 115 mg/dL (ref 0–200)
HDL: 40.3 mg/dL (ref >39.00)
LDL Cholesterol: 55 mg/dL (ref 0–99)
NonHDL: 74.27
Total CHOL/HDL Ratio: 3
Triglycerides: 98 mg/dL (ref 0.0–149.0)
VLDL: 19.6 mg/dL (ref 0.0–40.0)

## 2024-03-29 LAB — VITAMIN B12: Vitamin B-12: 1267 pg/mL — ABNORMAL HIGH (ref 211–911)

## 2024-03-29 LAB — HEMOGLOBIN A1C: Hgb A1c MFr Bld: 7.5 % — ABNORMAL HIGH (ref 4.6–6.5)

## 2024-03-29 LAB — TSH: TSH: 3.91 u[IU]/mL (ref 0.35–5.50)

## 2024-03-29 LAB — VITAMIN D 25 HYDROXY (VIT D DEFICIENCY, FRACTURES): VITD: >120 ng/mL

## 2024-03-29 MED ORDER — CITALOPRAM HYDROBROMIDE 10 MG PO TABS: 10.0000 mg | ORAL_TABLET | Freq: Every day | ORAL | 3 refills | Status: AC

## 2024-03-29 MED ORDER — CLOPIDOGREL BISULFATE 75 MG PO TABS: 75.0000 mg | ORAL_TABLET | Freq: Every day | ORAL | 1 refills | Status: AC

## 2024-03-29 MED ORDER — PROPRANOLOL HCL 10 MG PO TABS: 10.0000 mg | ORAL_TABLET | Freq: Every day | ORAL | 3 refills | Status: AC

## 2024-03-29 NOTE — Assessment & Plan Note (Signed)
 Lab Results  Component Value Date   CREATININE 0.94 03/29/2024   Stable overall, cont to avoid nephrotoxins

## 2024-03-29 NOTE — Patient Instructions (Signed)

## 2024-03-29 NOTE — Assessment & Plan Note (Addendum)
 Chronic stable  but persistent, cont hct 12.5 mg every day but also for bilateral compression stockings daytime hours

## 2024-03-29 NOTE — Assessment & Plan Note (Signed)
Age and sex appropriate education and counseling updated with regular exercise and diet Referrals for preventative services - none needed Immunizations addressed - declines flu shot, for shingrix at pharmacy Smoking counseling  - none needed Evidence for depression or other mood disorder - none significant Most recent labs reviewed. I have personally reviewed and have noted: 1) the patient's medical and social history 2) The patient's current medications and supplements 3) The patient's height, weight, and BMI have been recorded in the chart

## 2024-03-29 NOTE — Telephone Encounter (Signed)
 CRITICAL VALUE STICKER   CRITICAL VALUE: VITAMIN D  <120   RECEIVER (on-site recipient of call): Yordan Martindale    DATE & TIME NOTIFIED: 03/29/24 16:55   MESSENGER (representative from lab): Sah   MD NOTIFIED: yes    TIME OF NOTIFICATION:   RESPONSE:

## 2024-03-29 NOTE — Assessment & Plan Note (Signed)
 Lab Results  Component Value Date   LDLCALC 55 03/29/2024   Stable, pt to continue current statin pravachol  40 mg qd

## 2024-03-29 NOTE — Assessment & Plan Note (Signed)
 Lab Results  Component Value Date   HGBA1C 7.5 (H) 03/29/2024   Stable, pt to continue current medical treatment metfomrin ER 500 mg - 3 every day,

## 2024-03-29 NOTE — Progress Notes (Signed)
 Patient ID: Vincent Campbell, male   DOB: [birth year], 83 y.o.   MRN: [id number]         Chief Complaint:: wellness exam and leg swelling peripheral edema, dm, ckd3a, htn, hld, low thyroid          HPI:  Vincent Campbell is a 83 y.o. male here for wellness exam; declines flu shot, for shingrix at pharmacy, o/w up to date                        Also Pt denies chest pain, increased sob or doe, wheezing, orthopnea, PND, palpitations, dizziness or syncope though has stable chronic leg swelling mild.   Pt denies polydipsia, polyuria, or new focal neuro s/s.    Pt denies fever, wt loss, night sweats, loss of appetite, or other constitutional symptoms     Wt Readings from Last 3 Encounters:  03/29/24 192 lb (87.1 kg)  03/16/24 198 lb 6.4 oz (90 kg)  03/17/23 203 lb (92.1 kg)   BP Readings from Last 3 Encounters:  03/29/24 110/80  03/16/24 107/63  03/17/23 120/76   Immunization History  Administered Date(s) Administered   Fluad Quad(high Dose 65+) 03/16/2019, 06/14/2020   INFLUENZA, HIGH DOSE SEASONAL PF 06/26/2016, 07/13/2017, 09/15/2018   Influenza,inj,Quad PF,6+ Mos 05/30/2015   PFIZER(Purple Top)SARS-COV-2 Vaccination 10/27/2019, 11/28/2019   Pneumococcal Conjugate-13 02/03/2013, 08/11/2013   Pneumococcal Polysaccharide-23 07/28/2012   Td 07/28/2001   Tdap 08/10/2023   Tetanus 02/03/2013   Health Maintenance Due  Topic Date Due   Zoster Vaccines- Shingrix (1 of 2) Never done   INFLUENZA VACCINE  02/26/2024   Medicare Annual Wellness (AWV)  02/27/2024      Past Medical History:  Diagnosis Date   BACK PAIN 01/12/2008   Carotid artery occlusion    COLONIC POLYPS, HX OF 01/12/2008   Cough 05/28/2010   Encounter for well adult exam with abnormal findings 12/29/2010   HYPERLIPIDEMIA 01/12/2008   HYPERTENSION 01/12/2008   Hypothyroidism 09/15/2018   LEUKOCYTOSIS 03/02/2008   PERIPHERAL EDEMA 06/28/2010   SWELLING MASS OR LUMP IN HEAD AND NECK 05/28/2010   Tremor 08/02/2012    Past Surgical History:  Procedure Laterality Date   APPENDECTOMY     CATARACT EXTRACTION     CHOLECYSTECTOMY     TRANSCAROTID ARTERY REVASCULARIZATION  Right 09/26/2020   Procedure: RIGHT TRANSCAROTID ARTERY REVASCULARIZATION;  Surgeon: Sheree Penne Bruckner, MD;  Location: [facility] OR;  Service: Vascular;  Laterality: Right;    reports that he quit smoking about 3 years ago. His smoking use included cigarettes. He has never used smokeless tobacco. He reports current alcohol use. He reports that he does not use drugs. family history includes Cancer in an other family member; Heart disease in an other family member; Hypertension in an other family member. No Known Allergies Current Outpatient Medications on File Prior to Visit  Medication Sig Dispense Refill   EUTHYROX  25 MCG tablet TAKE 1 TABLET BY MOUTH ONCE DAILY BEFORE BREAKFAST 90 tablet 3   furosemide  (LASIX ) 40 MG tablet TAKE 1 TABLET BY MOUTH EVERY OTHER DAY 45 tablet 0   Glucosamine 500 MG CAPS Take 500 mg by mouth daily.     hydrochlorothiazide  (MICROZIDE ) 12.5 MG capsule Take 1 capsule by mouth once daily 90 capsule 0   losartan  (COZAAR ) 100 MG tablet Take 1 tablet (100 mg total) by mouth daily. 90 tablet 3   metFORMIN  (GLUCOPHAGE -XR) 500 MG 24 hr tablet TAKE 3 TABLETS BY MOUTH IN  THE MORNING WITH BREAKFAST. Patient to follow up with PCP prior to future refills 270 tablet 0   pantoprazole  (PROTONIX ) 40 MG tablet Take 1 tablet by mouth once daily 90 tablet 0   pravastatin  (PRAVACHOL ) 40 MG tablet Take 1 tablet by mouth once daily 90 tablet 3   VITAMIN D  PO Take 2 capsules by mouth daily.     ZINC CITRATE PO Take 1 tablet by mouth daily at 6 (six) AM.     No current facility-administered medications on file prior to visit.        ROS:  All others reviewed and negative.  Objective        PE:  BP 110/80   Pulse (!) 49   Temp 97.6 F (36.4 C)   Ht 5' 7.5 (1.715 m)   Wt 192 lb (87.1 kg)   SpO2 97%   BMI 29.63 kg/m                  Constitutional: Pt appears in NAD               HENT: Head: NCAT.                Right Ear: External ear normal.                 Left Ear: External ear normal.                Eyes: . Pupils are equal, round, and reactive to light. Conjunctivae and EOM are normal               Nose: without d/c or deformity               Neck: Neck supple. Gross normal ROM               Cardiovascular: Normal rate and regular rhythm.                 Pulmonary/Chest: Effort normal and breath sounds without rales or wheezing.                Abd:  Soft, NT, ND, + BS, no organomegaly               Neurological: Pt is alert. At baseline orientation, motor grossly intact               Skin: Skin is warm. No rashes, no other new lesions, LE edema - trace to 1+ bilateral               Psychiatric: Pt behavior is normal without agitation   Micro: none  Cardiac tracings I have personally interpreted today:  none  Pertinent Radiological findings (summarize): none   Lab Results  Component Value Date   WBC 9.2 03/29/2024   HGB 13.3 03/29/2024   HCT 40.0 03/29/2024   PLT 239.0 03/29/2024   GLUCOSE 116 (H) 03/29/2024   CHOL 115 03/29/2024   TRIG 98.0 03/29/2024   HDL 40.30 03/29/2024   LDLDIRECT 116.7 05/28/2010   LDLCALC 55 03/29/2024   ALT 21 03/29/2024   AST 21 03/29/2024   NA 137 03/29/2024   K 4.4 03/29/2024   CL 98 03/29/2024   CREATININE 0.94 03/29/2024   BUN 13 03/29/2024   CO2 28 03/29/2024   TSH 3.91 03/29/2024   PSA 2.70 09/20/2019   INR 1.3 (H) 09/20/2020   HGBA1C 7.5 (H) 03/29/2024   MICROALBUR <0.7 03/29/2024   Assessment/Plan:  Vincent Campbell is a 83 y.o. White or Caucasian [1] male with  has a past medical history of BACK PAIN (01/12/2008), Carotid artery occlusion, COLONIC POLYPS, HX OF (01/12/2008), Cough (05/28/2010), Encounter for well adult exam with abnormal findings (12/29/2010), HYPERLIPIDEMIA (01/12/2008), HYPERTENSION (01/12/2008), Hypothyroidism (09/15/2018),  LEUKOCYTOSIS (03/02/2008), PERIPHERAL EDEMA (06/28/2010), SWELLING MASS OR LUMP IN HEAD AND NECK (05/28/2010), and Tremor (08/02/2012).  CKD (chronic kidney disease), stage II Lab Results  Component Value Date   CREATININE 0.94 03/29/2024   Stable overall, cont to avoid nephrotoxins   Diabetes (HCC) Lab Results  Component Value Date   HGBA1C 7.5 (H) 03/29/2024   Stable, pt to continue current medical treatment metfomrin ER 500 mg - 3 every day,    Essential hypertension BP Readings from Last 3 Encounters:  03/29/24 110/80  03/16/24 107/63  03/17/23 120/76   Stable, pt to continue medical treatment hct 12.5 mg every day, losartan  100 every day, inderall 10 mg qd   Hyperlipidemia Lab Results  Component Value Date   LDLCALC 55 03/29/2024   Stable, pt to continue current statin pravachol  40 mg qd   Hypothyroidism Lab Results  Component Value Date   TSH 3.91 03/29/2024   Stable, pt to continue levothyroxine  25 mcg qd   PERIPHERAL EDEMA Chronic stable  but persistent, cont hct 12.5 mg every day but also for bilateral compression stockings daytime hours  Encounter for well adult exam with abnormal findings Age and sex appropriate education and counseling updated with regular exercise and diet Referrals for preventative services - none needed Immunizations addressed - declines flu shot, for shingrix at pharmacy Smoking counseling  - none needed Evidence for depression or other mood disorder - none significant Most recent labs reviewed. I have personally reviewed and have noted: 1) the patient's medical and social history 2) The patient's current medications and supplements 3) The patient's height, weight, and BMI have been recorded in the chart   Followup: Return in about 6 months (around 09/26/2024).  Vincent Rush, MD 03/29/2024 7:56 PM [facility]

## 2024-03-29 NOTE — Assessment & Plan Note (Signed)
 Lab Results  Component Value Date   TSH 3.91 03/29/2024   Stable, pt to continue levothyroxine  25 mcg qd

## 2024-03-29 NOTE — Progress Notes (Signed)
 The test results show that your current treatment is OK, as the tests are stable.  Please continue the same plan.  There is no other need for change of treatment or further evaluation based on these results, at this time.  thanks

## 2024-03-29 NOTE — Assessment & Plan Note (Signed)
 BP Readings from Last 3 Encounters:  03/29/24 110/80  03/16/24 107/63  03/17/23 120/76   Stable, pt to continue medical treatment hct 12.5 mg every day, losartan  100 every day, inderall 10 mg qd

## 2024-03-30 NOTE — Telephone Encounter (Signed)
 Ok to call pt or family -   Ok to take 1 Vit D at 1000 units per day, as his level was high and he should cut back from 2 pills to 1 pill   thanks

## 2024-03-31 NOTE — Telephone Encounter (Signed)
 Spoke with patients wife with patient with her, patient is hard of hearing. They understood to cut back on Vit D to 1 pill a day with no further questions

## 2024-04-01 ENCOUNTER — Telehealth: Payer: Self-pay | Admitting: Radiology

## 2024-04-01 NOTE — Telephone Encounter (Signed)
 Copied from CRM [id number]. Topic: General - Other >> Apr 01, 2024  2:12 PM Sasha M wrote: Reason for CRM: Pt wife calling to find out if paperwork for [facility] is filled out and ready for pick up. Pt wife states that they would rather pick up because they have had issues in the past with faxing it. Please call [phone] to inform if/when paperwork will be ready, thank you

## 2024-04-03 ENCOUNTER — Other Ambulatory Visit: Payer: Self-pay | Admitting: Internal Medicine

## 2024-04-07 NOTE — Telephone Encounter (Signed)
 Form has since been filled out, signed, and picked up by patient

## 2024-04-16 ENCOUNTER — Other Ambulatory Visit: Payer: Self-pay | Admitting: Internal Medicine

## 2024-04-30 ENCOUNTER — Other Ambulatory Visit: Payer: Self-pay | Admitting: Internal Medicine

## 2024-05-02 ENCOUNTER — Other Ambulatory Visit: Payer: Self-pay

## 2024-05-14 ENCOUNTER — Other Ambulatory Visit: Payer: Self-pay | Admitting: Internal Medicine

## 2024-05-16 ENCOUNTER — Other Ambulatory Visit: Payer: Self-pay

## 2024-05-21 ENCOUNTER — Other Ambulatory Visit: Payer: Self-pay | Admitting: Internal Medicine

## 2024-05-23 ENCOUNTER — Other Ambulatory Visit: Payer: Self-pay

## 2024-06-04 ENCOUNTER — Other Ambulatory Visit: Payer: Self-pay | Admitting: Internal Medicine

## 2024-06-06 ENCOUNTER — Other Ambulatory Visit: Payer: Self-pay

## 2024-07-09 ENCOUNTER — Other Ambulatory Visit: Payer: Self-pay | Admitting: Internal Medicine

## 2024-07-11 ENCOUNTER — Other Ambulatory Visit: Payer: Self-pay

## 2024-08-18 ENCOUNTER — Other Ambulatory Visit: Payer: Self-pay

## 2024-08-18 ENCOUNTER — Other Ambulatory Visit: Payer: Self-pay | Admitting: Internal Medicine

## 2024-08-26 ENCOUNTER — Other Ambulatory Visit: Payer: Self-pay | Admitting: Internal Medicine

## 2024-08-26 ENCOUNTER — Other Ambulatory Visit: Payer: Self-pay

## 2024-09-27 ENCOUNTER — Ambulatory Visit: Payer: Medicare (Managed Care) | Admitting: Internal Medicine
